# Patient Record
Sex: Female | Born: 1960
Health system: Southern US, Community
[De-identification: ages and names within clinical notes are randomized; demographics above are authoritative.]

## PROBLEM LIST (undated history)

## (undated) DIAGNOSIS — G4730 Sleep apnea, unspecified: Secondary | ICD-10-CM

## (undated) DIAGNOSIS — I509 Heart failure, unspecified: Secondary | ICD-10-CM

## (undated) DIAGNOSIS — J45909 Unspecified asthma, uncomplicated: Secondary | ICD-10-CM

## (undated) DIAGNOSIS — T7840XA Allergy, unspecified, initial encounter: Secondary | ICD-10-CM

## (undated) DIAGNOSIS — G4733 Obstructive sleep apnea (adult) (pediatric): Secondary | ICD-10-CM

## (undated) DIAGNOSIS — G709 Myoneural disorder, unspecified: Secondary | ICD-10-CM

## (undated) DIAGNOSIS — E785 Hyperlipidemia, unspecified: Secondary | ICD-10-CM

## (undated) DIAGNOSIS — E669 Obesity, unspecified: Secondary | ICD-10-CM

## (undated) DIAGNOSIS — M1990 Unspecified osteoarthritis, unspecified site: Secondary | ICD-10-CM

## (undated) DIAGNOSIS — K219 Gastro-esophageal reflux disease without esophagitis: Secondary | ICD-10-CM

## (undated) DIAGNOSIS — R600 Localized edema: Secondary | ICD-10-CM

## (undated) DIAGNOSIS — I10 Essential (primary) hypertension: Secondary | ICD-10-CM

## (undated) HISTORY — DX: Allergy, unspecified, initial encounter: T78.40XA

## (undated) HISTORY — PX: EYE SURGERY: SHX253

## (undated) HISTORY — PX: ABDOMINAL HYSTERECTOMY: SHX81

## (undated) HISTORY — DX: Gastro-esophageal reflux disease without esophagitis: K21.9

## (undated) HISTORY — DX: Hyperlipidemia, unspecified: E78.5

## (undated) HISTORY — DX: Sleep apnea, unspecified: G47.30

## (undated) HISTORY — DX: Localized edema: R60.0

## (undated) HISTORY — DX: Myoneural disorder, unspecified: G70.9

## (undated) HISTORY — DX: Obstructive sleep apnea (adult) (pediatric): G47.33

---

## 1998-12-03 ENCOUNTER — Encounter: Admission: RE | Admit: 1998-12-03 | Discharge: 1998-12-03 | Payer: Self-pay | Admitting: Internal Medicine

## 1998-12-19 ENCOUNTER — Encounter: Admission: RE | Admit: 1998-12-19 | Discharge: 1998-12-19 | Payer: Self-pay | Admitting: Hematology and Oncology

## 1999-01-02 ENCOUNTER — Encounter: Admission: RE | Admit: 1999-01-02 | Discharge: 1999-01-02 | Payer: Self-pay | Admitting: Internal Medicine

## 1999-03-25 ENCOUNTER — Encounter: Admission: RE | Admit: 1999-03-25 | Discharge: 1999-03-25 | Payer: Self-pay | Admitting: Internal Medicine

## 2000-08-07 ENCOUNTER — Encounter: Admission: RE | Admit: 2000-08-07 | Discharge: 2000-08-07 | Payer: Self-pay | Admitting: Internal Medicine

## 2000-11-03 ENCOUNTER — Encounter: Admission: RE | Admit: 2000-11-03 | Discharge: 2000-11-03 | Payer: Self-pay | Admitting: Internal Medicine

## 2000-11-09 ENCOUNTER — Encounter: Admission: RE | Admit: 2000-11-09 | Discharge: 2000-11-09 | Payer: Self-pay

## 2001-04-11 ENCOUNTER — Emergency Department (HOSPITAL_COMMUNITY): Admission: EM | Admit: 2001-04-11 | Discharge: 2001-04-11 | Payer: Self-pay | Admitting: Emergency Medicine

## 2001-04-13 ENCOUNTER — Ambulatory Visit (HOSPITAL_COMMUNITY): Admission: RE | Admit: 2001-04-13 | Discharge: 2001-04-13 | Payer: Self-pay | Admitting: Family Medicine

## 2001-04-13 ENCOUNTER — Encounter: Payer: Self-pay | Admitting: Family Medicine

## 2001-09-01 ENCOUNTER — Encounter: Payer: Self-pay | Admitting: Emergency Medicine

## 2001-09-01 ENCOUNTER — Emergency Department (HOSPITAL_COMMUNITY): Admission: EM | Admit: 2001-09-01 | Discharge: 2001-09-01 | Payer: Self-pay | Admitting: Emergency Medicine

## 2002-04-06 ENCOUNTER — Encounter: Payer: Self-pay | Admitting: Emergency Medicine

## 2002-04-06 ENCOUNTER — Emergency Department (HOSPITAL_COMMUNITY): Admission: EM | Admit: 2002-04-06 | Discharge: 2002-04-06 | Payer: Self-pay | Admitting: Emergency Medicine

## 2002-04-20 ENCOUNTER — Inpatient Hospital Stay (HOSPITAL_COMMUNITY): Admission: EM | Admit: 2002-04-20 | Discharge: 2002-04-22 | Payer: Self-pay | Admitting: Emergency Medicine

## 2002-04-20 ENCOUNTER — Encounter: Payer: Self-pay | Admitting: Internal Medicine

## 2002-04-21 ENCOUNTER — Encounter (INDEPENDENT_AMBULATORY_CARE_PROVIDER_SITE_OTHER): Payer: Self-pay | Admitting: Cardiology

## 2003-03-01 ENCOUNTER — Emergency Department (HOSPITAL_COMMUNITY): Admission: EM | Admit: 2003-03-01 | Discharge: 2003-03-01 | Payer: Self-pay | Admitting: Emergency Medicine

## 2004-02-18 ENCOUNTER — Emergency Department (HOSPITAL_COMMUNITY): Admission: EM | Admit: 2004-02-18 | Discharge: 2004-02-18 | Payer: Self-pay | Admitting: Emergency Medicine

## 2004-03-05 ENCOUNTER — Emergency Department (HOSPITAL_COMMUNITY): Admission: EM | Admit: 2004-03-05 | Discharge: 2004-03-05 | Payer: Self-pay | Admitting: Emergency Medicine

## 2004-05-16 ENCOUNTER — Ambulatory Visit: Payer: Self-pay | Admitting: Family Medicine

## 2004-05-16 ENCOUNTER — Ambulatory Visit: Payer: Self-pay | Admitting: *Deleted

## 2004-05-22 ENCOUNTER — Emergency Department (HOSPITAL_COMMUNITY): Admission: EM | Admit: 2004-05-22 | Discharge: 2004-05-22 | Payer: Self-pay | Admitting: Emergency Medicine

## 2004-09-12 ENCOUNTER — Ambulatory Visit: Payer: Self-pay | Admitting: Family Medicine

## 2004-09-17 ENCOUNTER — Ambulatory Visit: Payer: Self-pay | Admitting: Family Medicine

## 2004-12-13 ENCOUNTER — Ambulatory Visit: Payer: Self-pay | Admitting: Family Medicine

## 2004-12-17 ENCOUNTER — Ambulatory Visit (HOSPITAL_COMMUNITY): Admission: RE | Admit: 2004-12-17 | Discharge: 2004-12-17 | Payer: Self-pay | Admitting: Family Medicine

## 2005-02-13 ENCOUNTER — Ambulatory Visit: Payer: Self-pay | Admitting: Family Medicine

## 2005-06-04 ENCOUNTER — Ambulatory Visit: Payer: Self-pay | Admitting: Family Medicine

## 2005-06-13 ENCOUNTER — Ambulatory Visit (HOSPITAL_COMMUNITY): Admission: RE | Admit: 2005-06-13 | Discharge: 2005-06-13 | Payer: Self-pay | Admitting: Family Medicine

## 2005-07-16 ENCOUNTER — Ambulatory Visit: Payer: Self-pay | Admitting: Family Medicine

## 2005-09-14 ENCOUNTER — Emergency Department (HOSPITAL_COMMUNITY): Admission: EM | Admit: 2005-09-14 | Discharge: 2005-09-14 | Payer: Self-pay | Admitting: Emergency Medicine

## 2005-09-15 ENCOUNTER — Ambulatory Visit: Payer: Self-pay | Admitting: Family Medicine

## 2006-01-05 ENCOUNTER — Ambulatory Visit: Payer: Self-pay | Admitting: Family Medicine

## 2006-01-21 ENCOUNTER — Ambulatory Visit: Payer: Self-pay | Admitting: Family Medicine

## 2006-03-17 ENCOUNTER — Ambulatory Visit: Payer: Self-pay | Admitting: Family Medicine

## 2006-07-23 ENCOUNTER — Emergency Department (HOSPITAL_COMMUNITY): Admission: EM | Admit: 2006-07-23 | Discharge: 2006-07-24 | Payer: Self-pay | Admitting: Emergency Medicine

## 2006-07-24 ENCOUNTER — Encounter: Payer: Self-pay | Admitting: Vascular Surgery

## 2006-07-24 ENCOUNTER — Ambulatory Visit (HOSPITAL_COMMUNITY): Admission: RE | Admit: 2006-07-24 | Discharge: 2006-07-24 | Payer: Self-pay

## 2006-07-28 ENCOUNTER — Ambulatory Visit: Payer: Self-pay | Admitting: Family Medicine

## 2006-07-29 ENCOUNTER — Ambulatory Visit (HOSPITAL_COMMUNITY): Admission: RE | Admit: 2006-07-29 | Discharge: 2006-07-29 | Payer: Self-pay | Admitting: Family Medicine

## 2006-08-04 ENCOUNTER — Ambulatory Visit: Payer: Self-pay | Admitting: Family Medicine

## 2006-08-18 DIAGNOSIS — I509 Heart failure, unspecified: Secondary | ICD-10-CM

## 2006-08-18 HISTORY — DX: Heart failure, unspecified: I50.9

## 2006-11-02 ENCOUNTER — Ambulatory Visit: Payer: Self-pay | Admitting: Family Medicine

## 2006-11-30 ENCOUNTER — Emergency Department (HOSPITAL_COMMUNITY): Admission: EM | Admit: 2006-11-30 | Discharge: 2006-11-30 | Payer: Self-pay | Admitting: Emergency Medicine

## 2006-12-08 ENCOUNTER — Emergency Department (HOSPITAL_COMMUNITY): Admission: EM | Admit: 2006-12-08 | Discharge: 2006-12-08 | Payer: Self-pay | Admitting: Emergency Medicine

## 2007-01-21 ENCOUNTER — Emergency Department (HOSPITAL_COMMUNITY): Admission: EM | Admit: 2007-01-21 | Discharge: 2007-01-21 | Payer: Self-pay | Admitting: Emergency Medicine

## 2007-02-04 ENCOUNTER — Emergency Department (HOSPITAL_COMMUNITY): Admission: EM | Admit: 2007-02-04 | Discharge: 2007-02-04 | Payer: Self-pay | Admitting: Family Medicine

## 2007-02-05 ENCOUNTER — Emergency Department (HOSPITAL_COMMUNITY): Admission: EM | Admit: 2007-02-05 | Discharge: 2007-02-05 | Payer: Self-pay | Admitting: Emergency Medicine

## 2007-02-06 ENCOUNTER — Emergency Department (HOSPITAL_COMMUNITY): Admission: EM | Admit: 2007-02-06 | Discharge: 2007-02-06 | Payer: Self-pay | Admitting: Emergency Medicine

## 2007-02-10 ENCOUNTER — Ambulatory Visit: Payer: Self-pay | Admitting: Family Medicine

## 2007-02-12 DIAGNOSIS — E7800 Pure hypercholesterolemia, unspecified: Secondary | ICD-10-CM | POA: Insufficient documentation

## 2007-02-12 DIAGNOSIS — J45909 Unspecified asthma, uncomplicated: Secondary | ICD-10-CM | POA: Insufficient documentation

## 2007-02-12 DIAGNOSIS — E1165 Type 2 diabetes mellitus with hyperglycemia: Secondary | ICD-10-CM

## 2007-02-12 DIAGNOSIS — J309 Allergic rhinitis, unspecified: Secondary | ICD-10-CM | POA: Insufficient documentation

## 2007-02-12 DIAGNOSIS — M1990 Unspecified osteoarthritis, unspecified site: Secondary | ICD-10-CM | POA: Insufficient documentation

## 2007-02-12 DIAGNOSIS — I10 Essential (primary) hypertension: Secondary | ICD-10-CM | POA: Insufficient documentation

## 2007-02-12 DIAGNOSIS — E669 Obesity, unspecified: Secondary | ICD-10-CM

## 2007-02-12 DIAGNOSIS — E1169 Type 2 diabetes mellitus with other specified complication: Secondary | ICD-10-CM | POA: Insufficient documentation

## 2007-02-12 DIAGNOSIS — M069 Rheumatoid arthritis, unspecified: Secondary | ICD-10-CM | POA: Insufficient documentation

## 2007-02-15 ENCOUNTER — Ambulatory Visit: Payer: Self-pay | Admitting: Family Medicine

## 2007-05-05 ENCOUNTER — Encounter (INDEPENDENT_AMBULATORY_CARE_PROVIDER_SITE_OTHER): Payer: Self-pay | Admitting: *Deleted

## 2007-08-17 ENCOUNTER — Ambulatory Visit: Payer: Self-pay | Admitting: Family Medicine

## 2007-08-18 ENCOUNTER — Emergency Department (HOSPITAL_COMMUNITY): Admission: EM | Admit: 2007-08-18 | Discharge: 2007-08-18 | Payer: Self-pay | Admitting: Emergency Medicine

## 2007-09-03 ENCOUNTER — Emergency Department (HOSPITAL_COMMUNITY): Admission: EM | Admit: 2007-09-03 | Discharge: 2007-09-04 | Payer: Self-pay | Admitting: Emergency Medicine

## 2007-10-14 ENCOUNTER — Ambulatory Visit: Payer: Self-pay | Admitting: Internal Medicine

## 2007-10-15 ENCOUNTER — Encounter (INDEPENDENT_AMBULATORY_CARE_PROVIDER_SITE_OTHER): Payer: Self-pay | Admitting: Family Medicine

## 2007-10-15 LAB — CONVERTED CEMR LAB: Microalb, Ur: 0.37 mg/dL (ref 0.00–1.89)

## 2008-01-12 ENCOUNTER — Ambulatory Visit: Payer: Self-pay | Admitting: Family Medicine

## 2008-01-14 ENCOUNTER — Ambulatory Visit: Payer: Self-pay | Admitting: Internal Medicine

## 2008-01-28 ENCOUNTER — Ambulatory Visit: Payer: Self-pay | Admitting: Family Medicine

## 2008-04-06 ENCOUNTER — Emergency Department (HOSPITAL_COMMUNITY): Admission: EM | Admit: 2008-04-06 | Discharge: 2008-04-06 | Payer: Self-pay | Admitting: Emergency Medicine

## 2008-04-26 ENCOUNTER — Ambulatory Visit: Payer: Self-pay | Admitting: Family Medicine

## 2008-09-05 ENCOUNTER — Ambulatory Visit: Payer: Self-pay | Admitting: Family Medicine

## 2008-09-06 ENCOUNTER — Ambulatory Visit: Payer: Self-pay | Admitting: Family Medicine

## 2008-09-28 ENCOUNTER — Ambulatory Visit: Payer: Self-pay | Admitting: Family Medicine

## 2008-10-04 ENCOUNTER — Ambulatory Visit: Payer: Self-pay | Admitting: Family Medicine

## 2008-12-07 ENCOUNTER — Ambulatory Visit: Payer: Self-pay | Admitting: Family Medicine

## 2009-02-27 ENCOUNTER — Ambulatory Visit: Payer: Self-pay | Admitting: Family Medicine

## 2009-05-24 ENCOUNTER — Ambulatory Visit: Payer: Self-pay | Admitting: Family Medicine

## 2009-05-24 ENCOUNTER — Inpatient Hospital Stay (HOSPITAL_COMMUNITY): Admission: EM | Admit: 2009-05-24 | Discharge: 2009-05-25 | Payer: Self-pay | Admitting: Emergency Medicine

## 2009-05-31 ENCOUNTER — Ambulatory Visit: Payer: Self-pay | Admitting: Family Medicine

## 2009-07-25 ENCOUNTER — Emergency Department (HOSPITAL_COMMUNITY): Admission: EM | Admit: 2009-07-25 | Discharge: 2009-07-25 | Payer: Self-pay | Admitting: Emergency Medicine

## 2009-10-20 ENCOUNTER — Emergency Department (HOSPITAL_COMMUNITY): Admission: EM | Admit: 2009-10-20 | Discharge: 2009-10-20 | Payer: Self-pay | Admitting: Emergency Medicine

## 2009-10-23 ENCOUNTER — Ambulatory Visit: Payer: Self-pay | Admitting: Family Medicine

## 2009-11-29 ENCOUNTER — Ambulatory Visit: Payer: Self-pay | Admitting: Internal Medicine

## 2009-11-29 ENCOUNTER — Encounter (INDEPENDENT_AMBULATORY_CARE_PROVIDER_SITE_OTHER): Payer: Self-pay | Admitting: Adult Health

## 2009-11-29 LAB — CONVERTED CEMR LAB
BUN: 15 mg/dL (ref 6–23)
CO2: 28 meq/L (ref 19–32)
Chloride: 100 meq/L (ref 96–112)
Glucose, Bld: 246 mg/dL — ABNORMAL HIGH (ref 70–99)
Potassium: 4.2 meq/L (ref 3.5–5.3)
Vit D, 25-Hydroxy: 21 ng/mL — ABNORMAL LOW (ref 30–89)

## 2009-12-27 ENCOUNTER — Ambulatory Visit: Payer: Self-pay | Admitting: Internal Medicine

## 2009-12-27 ENCOUNTER — Encounter (INDEPENDENT_AMBULATORY_CARE_PROVIDER_SITE_OTHER): Payer: Self-pay | Admitting: Family Medicine

## 2009-12-27 LAB — CONVERTED CEMR LAB: Microalb, Ur: 0.5 mg/dL (ref 0.00–1.89)

## 2010-01-04 ENCOUNTER — Emergency Department (HOSPITAL_COMMUNITY): Admission: EM | Admit: 2010-01-04 | Discharge: 2010-01-04 | Payer: Self-pay | Admitting: Emergency Medicine

## 2010-01-17 ENCOUNTER — Emergency Department (HOSPITAL_COMMUNITY): Admission: EM | Admit: 2010-01-17 | Discharge: 2010-01-17 | Payer: Self-pay | Admitting: Emergency Medicine

## 2010-03-06 ENCOUNTER — Ambulatory Visit: Payer: Self-pay | Admitting: Internal Medicine

## 2010-04-02 ENCOUNTER — Ambulatory Visit: Payer: Self-pay | Admitting: Family Medicine

## 2010-07-03 ENCOUNTER — Ambulatory Visit: Payer: Self-pay | Admitting: Internal Medicine

## 2010-08-01 ENCOUNTER — Ambulatory Visit: Payer: Self-pay | Admitting: Family Medicine

## 2010-09-07 ENCOUNTER — Encounter: Payer: Self-pay | Admitting: Family Medicine

## 2010-11-04 LAB — DIFFERENTIAL
Lymphocytes Relative: 33 % (ref 12–46)
Lymphs Abs: 1.8 10*3/uL (ref 0.7–4.0)
Monocytes Relative: 5 % (ref 3–12)
Neutrophils Relative %: 59 % (ref 43–77)

## 2010-11-04 LAB — CBC
HCT: 34.4 % — ABNORMAL LOW (ref 36.0–46.0)
MCHC: 33.7 g/dL (ref 30.0–36.0)
Platelets: 287 10*3/uL (ref 150–400)
RBC: 4.22 MIL/uL (ref 3.87–5.11)
RDW: 14.7 % (ref 11.5–15.5)
WBC: 5.7 10*3/uL (ref 4.0–10.5)

## 2010-11-04 LAB — URINALYSIS, ROUTINE W REFLEX MICROSCOPIC
Ketones, ur: NEGATIVE mg/dL
Leukocytes, UA: NEGATIVE
Nitrite: NEGATIVE
pH: 5.5 (ref 5.0–8.0)

## 2010-11-04 LAB — BASIC METABOLIC PANEL
BUN: 10 mg/dL (ref 6–23)
Creatinine, Ser: 0.67 mg/dL (ref 0.4–1.2)
GFR calc non Af Amer: >60 mL/min (ref >60)

## 2010-11-04 LAB — PREGNANCY, URINE: Preg Test, Ur: NEGATIVE

## 2010-11-04 LAB — URINE MICROSCOPIC-ADD ON

## 2010-11-04 LAB — POCT CARDIAC MARKERS
CKMB, poc: 3.4 ng/mL (ref 1.0–8.0)
Troponin i, poc: <0.05 ng/mL (ref 0.00–0.09)

## 2010-11-04 LAB — URINE CULTURE

## 2010-11-10 LAB — URINALYSIS, ROUTINE W REFLEX MICROSCOPIC
Ketones, ur: NEGATIVE mg/dL
Nitrite: NEGATIVE
Specific Gravity, Urine: 1.026 (ref 1.005–1.030)
pH: 5.5 (ref 5.0–8.0)

## 2010-11-10 LAB — POCT I-STAT, CHEM 8
Calcium, Ion: 0.97 mmol/L — ABNORMAL LOW (ref 1.12–1.32)
Chloride: 105 mEq/L (ref 96–112)
HCT: 37 % (ref 36.0–46.0)
Potassium: 4.1 mEq/L (ref 3.5–5.1)

## 2010-11-10 LAB — POCT CARDIAC MARKERS: Troponin i, poc: <0.05 ng/mL (ref 0.00–0.09)

## 2010-11-10 LAB — DIFFERENTIAL
Basophils Absolute: 0.1 10*3/uL (ref 0.0–0.1)
Lymphocytes Relative: 33 % (ref 12–46)
Neutro Abs: 3.8 10*3/uL (ref 1.7–7.7)
Neutrophils Relative %: 58 % (ref 43–77)

## 2010-11-10 LAB — CBC
Platelets: 191 10*3/uL (ref 150–400)
RDW: 15.1 % (ref 11.5–15.5)

## 2010-11-10 LAB — BRAIN NATRIURETIC PEPTIDE: Pro B Natriuretic peptide (BNP): 62 pg/mL (ref 0.0–100.0)

## 2010-11-19 LAB — BASIC METABOLIC PANEL
BUN: 8 mg/dL (ref 6–23)
GFR calc non Af Amer: >60 mL/min (ref >60)
Glucose, Bld: 251 mg/dL — ABNORMAL HIGH (ref 70–99)
Potassium: 3.6 mEq/L (ref 3.5–5.1)

## 2010-11-19 LAB — DIFFERENTIAL
Basophils Absolute: 0 10*3/uL (ref 0.0–0.1)
Basophils Relative: 0 % (ref 0–1)
Eosinophils Absolute: 0.1 10*3/uL (ref 0.0–0.7)
Eosinophils Relative: 2 % (ref 0–5)
Lymphocytes Relative: 28 % (ref 12–46)

## 2010-11-19 LAB — CBC
HCT: 36.2 % (ref 36.0–46.0)
MCHC: 31.8 g/dL (ref 30.0–36.0)
MCV: 81.8 fL (ref 78.0–100.0)
Platelets: 252 10*3/uL (ref 150–400)
RDW: 14.4 % (ref 11.5–15.5)

## 2010-11-21 LAB — CBC
HCT: 32.9 % — ABNORMAL LOW (ref 36.0–46.0)
Hemoglobin: 10.8 g/dL — ABNORMAL LOW (ref 12.0–15.0)
MCHC: 32.7 g/dL (ref 30.0–36.0)
Platelets: 175 10*3/uL (ref 150–400)
Platelets: 203 10*3/uL (ref 150–400)
RDW: 14.2 % (ref 11.5–15.5)
RDW: 14.4 % (ref 11.5–15.5)
WBC: 6.9 10*3/uL (ref 4.0–10.5)

## 2010-11-21 LAB — CK TOTAL AND CKMB (NOT AT ARMC)
Relative Index: 0.8 (ref 0.0–2.5)
Total CK: 342 U/L — ABNORMAL HIGH (ref 7–177)

## 2010-11-21 LAB — BASIC METABOLIC PANEL
BUN: 23 mg/dL (ref 6–23)
BUN: 31 mg/dL — ABNORMAL HIGH (ref 6–23)
Chloride: 99 mEq/L (ref 96–112)
Creatinine, Ser: 0.73 mg/dL (ref 0.4–1.2)
Creatinine, Ser: 0.87 mg/dL (ref 0.4–1.2)
GFR calc Af Amer: >60 mL/min (ref >60)
GFR calc non Af Amer: >60 mL/min (ref >60)
GFR calc non Af Amer: >60 mL/min (ref >60)

## 2010-11-21 LAB — HEMOGLOBIN A1C: Mean Plasma Glucose: 235 mg/dL

## 2010-11-21 LAB — TROPONIN I: Troponin I: <0.01 ng/mL (ref 0.00–0.06)

## 2010-11-21 LAB — LIPID PANEL
LDL Cholesterol: 105 mg/dL — ABNORMAL HIGH (ref 0–99)
Total CHOL/HDL Ratio: 3.8 RATIO
Triglycerides: 101 mg/dL (ref <150)
VLDL: 20 mg/dL (ref 0–40)

## 2010-11-21 LAB — DIFFERENTIAL
Basophils Relative: 1 % (ref 0–1)
Eosinophils Absolute: 0.2 10*3/uL (ref 0.0–0.7)
Eosinophils Relative: 3 % (ref 0–5)
Monocytes Relative: 6 % (ref 3–12)
Neutrophils Relative %: 57 % (ref 43–77)

## 2010-11-21 LAB — GLUCOSE, CAPILLARY
Glucose-Capillary: 190 mg/dL — ABNORMAL HIGH (ref 70–99)
Glucose-Capillary: 239 mg/dL — ABNORMAL HIGH (ref 70–99)

## 2010-11-21 LAB — CARDIAC PANEL(CRET KIN+CKTOT+MB+TROPI)
Relative Index: 0.8 (ref 0.0–2.5)
Total CK: 263 U/L — ABNORMAL HIGH (ref 7–177)

## 2010-11-21 LAB — BRAIN NATRIURETIC PEPTIDE: Pro B Natriuretic peptide (BNP): <30 pg/mL (ref 0.0–100.0)

## 2011-01-03 NOTE — Discharge Summary (Signed)
NAME:  Katelyn Howard, Katelyn Howard                     ACCOUNT NO.:  [id number]   MEDICAL RECORD NO.:  [id number]                   PATIENT TYPE:  INP   LOCATION:  4707                                 FACILITY:  [facility]   PHYSICIAN:  Talmage Coin, M.D.                  DATE OF BIRTH:  Aug 23, 1960   DATE OF ADMISSION:  04/20/2002  DATE OF DISCHARGE:  04/22/2002                                 DISCHARGE SUMMARY   DISCHARGE DIAGNOSES:  1. Chest pain, noncardiac by exercise Cardiolite showing no ischemia.  2. Palpitations, no cause found.  3. Tobacco abuse.  4. Hypotension.  5. Normocytic anemia.  6. Diabetes mellitus, uncontrolled, hemoglobin A1c 11.5.  7. Obesity.  8. Status post hysterectomy, secondary to bleeding.   DISCHARGE MEDICATIONS:  1. Glipizide 10 mg q.d.  2. Zocor 20 mg q.d.  3. Mavik 1 mg q.d.  4. Aspirin 325 mg q.d.  5. Metformin 500 mg q.d. with meals.   DISPOSITION:  To home.   FOLLOWUP:  The patient will follow up at [facility]; she will arrange for  an appointment within the next week.  At that time, her hemoglobin should be  checked; it was 11.6 in the hospital.   CONSULTANTS:  Dr. Madaline Savage in cardiology.   HISTORY OF PRESENT ILLNESS:  The patient is a 50 year old African American  with diabetes who presents to the ER for a three-day history of chest pain,  palpitations and near-syncope that started Monday morning when she felt  numbness in her left fourth and fifth mornings, when at night, started  having a feeling of chest pressure.  This was accompanied by burping so she  took multiple Tums, thinking it was gas.  It eventually went away.  Yesterday, while walking around, she felt her heart beating rapidly.  She  had chest pressure and almost passed out.  She sat down, became diaphoretic  with shortness of breath and nauseated; this went on until about 2 a.m.  The  morning of admission, she went to work and the same thing happened, the same  chest  pain.  It went away after 20 minutes, then happened again and now she  is here with 7/10 chest pain and has orthopnea and PND.  Denies fever,  diarrhea, melena or hematuria.   PHYSICAL EXAMINATION:  VITAL SIGNS:  Blood pressure 162/81, temperature  98.0, pulse 74, respirations 22, saturation 99% on room air air.  GENERAL:  She is an obese female in no acute distress.  HEENT:  Normocephalic,  atraumatic.  Pupils equal, round and reactive to light.  Extraocular  movements are intact.  Oropharynx and nasopharynx moist.  No lesions.  NECK:  Supple.  No bruits.  LUNGS:  Clear to auscultation bilaterally.  No wheezes.  CARDIOVASCULAR:  Regular rate and rhythm.  No murmurs, rubs, or gallops.  ABDOMEN:  Soft, obese and nontender to palpation.  Degreased bowel sounds.  EXTREMITIES:  Edema 1+, nonpitting.  NEUROLOGIC:  No focal abnormalities.   LABORATORY DATA:  Sodium 134, potassium 3.7, chloride 101, CO2 22, BUN 11,  creatinine 0.6, glucose 296.  WBC 7.3, hemoglobin 11.6, platelets of  225,000.   HOSPITAL COURSE:  1. CHEST PAIN:  The patient had chest pain we thought which would not be     cardiac, based on her negative enzymes, even after 24 hours of pain.  She     did rule out by enzymes, with CKs running from 285 to 205, but MB     fractions being quite low and troponins being normal.  She had an     exercise Cardiolite which showed no ischemia.  Her echocardiogram showed     an EF of 50-55% with no focal abnormalities.  Her chest pain did not     recur during her hospitalization and she will be sent home with     instructions to take an H2 blocker.  She can be started on a PPI, if     these pains continue, although they do not sound like they are cardiac or     GI in origin, but more like a panic attack or musculoskeletal.   1. PALPITATIONS:  The patient was kept on a telemetry bed during her entire     hospitalization.  She continued to complain of these palpitation feelings     but  there were no changes on her telemetry.  It is felt that these     palpitations are due to musculoskeletal or panic attack origin.   1. DIABETES MELLITUS, TYPE 2:  The patient will continue her Glipizide 10 mg     q.d.  She was also started on Metformin 500 mg q.d. as she is obese and     would benefit from Metformin as a diabetic medication.  This should be     titrated upwards as tolerated.   1. HYPERLIPIDEMIA:  The patient was found to have an HDL of 56 and an LDL of     172 with normal triglycerides.  She was started on Zocor 20 mg q.d.  She     was to continue this medication in the future and have her cholesterol     rechecked in six months.   1. HYPERTENSION:  The patient had mild hypertension but being a diabetic,     she was started on an ACE inhibitor; she will take Mavik 1 mg q.d., to be     titrated as needed.   1. NORMOCYTIC ANEMIA:  The patient had a hemoglobin of 11.6, which is very     mild, but this should be rechecked after her discharge and followed up     with stool cards.  She reports having a hysterectomy in the past, so this     would not be a source of blood loss.     Thayer Headings, M.D.                     Talmage Coin, M.D.    BM/MEDQ  D:  04/22/2002  T:  04/25/2002  Job:  [id number]   cc:   Madaline Savage, M.D.  [address]  [place]  [place] [address]  Fax: [phone]   [facility]

## 2011-05-08 LAB — URINE MICROSCOPIC-ADD ON

## 2011-05-08 LAB — CBC
Hemoglobin: 12
RBC: 4.57
RDW: 14.1

## 2011-05-08 LAB — DIFFERENTIAL
Basophils Absolute: 0
Lymphocytes Relative: 33
Monocytes Absolute: 0.5
Neutro Abs: 4.5

## 2011-05-08 LAB — POCT CARDIAC MARKERS
CKMB, poc: 4.6
Myoglobin, poc: 113

## 2011-05-08 LAB — BASIC METABOLIC PANEL
Calcium: 9.4
GFR calc Af Amer: >60
GFR calc non Af Amer: >60
Glucose, Bld: 333 — ABNORMAL HIGH
Sodium: 130 — ABNORMAL LOW

## 2011-05-08 LAB — URINALYSIS, ROUTINE W REFLEX MICROSCOPIC
Bilirubin Urine: NEGATIVE
Glucose, UA: >1000 — AB
Ketones, ur: NEGATIVE
pH: 5.5

## 2011-05-08 LAB — B-NATRIURETIC PEPTIDE (CONVERTED LAB): Pro B Natriuretic peptide (BNP): <30

## 2011-05-23 LAB — COMPREHENSIVE METABOLIC PANEL
ALT: 26
AST: 22
Albumin: 3.5
CO2: 26
Calcium: 9.1
GFR calc Af Amer: >60
Sodium: 136
Total Protein: 7.9

## 2011-05-23 LAB — CBC
MCHC: 33.6
Platelets: 234
RBC: 4.67
RDW: 14.4

## 2011-05-23 LAB — POCT CARDIAC MARKERS
Myoglobin, poc: 78
Operator id: 4708

## 2011-05-23 LAB — URINE MICROSCOPIC-ADD ON

## 2011-05-23 LAB — URINALYSIS, ROUTINE W REFLEX MICROSCOPIC
Glucose, UA: >1000 — AB
Hgb urine dipstick: NEGATIVE
Ketones, ur: NEGATIVE
Protein, ur: NEGATIVE
pH: 5.5

## 2011-05-23 LAB — DIFFERENTIAL
Eosinophils Absolute: 0.1
Eosinophils Relative: 2
Lymphs Abs: 1.9
Monocytes Absolute: 0.5
Monocytes Relative: 6

## 2011-05-23 LAB — POCT PREGNANCY, URINE: Preg Test, Ur: NEGATIVE

## 2011-06-03 ENCOUNTER — Emergency Department (HOSPITAL_COMMUNITY)
Admission: EM | Admit: 2011-06-03 | Discharge: 2011-06-03 | Disposition: A | Discharge status: Home / Self Care | Payer: Medicare Other | Attending: Emergency Medicine | Admitting: Emergency Medicine

## 2011-06-03 DIAGNOSIS — I509 Heart failure, unspecified: Secondary | ICD-10-CM | POA: Insufficient documentation

## 2011-06-03 DIAGNOSIS — I10 Essential (primary) hypertension: Secondary | ICD-10-CM | POA: Insufficient documentation

## 2011-06-03 DIAGNOSIS — E119 Type 2 diabetes mellitus without complications: Secondary | ICD-10-CM | POA: Insufficient documentation

## 2011-06-03 DIAGNOSIS — L03019 Cellulitis of unspecified finger: Secondary | ICD-10-CM | POA: Insufficient documentation

## 2011-06-03 DIAGNOSIS — K219 Gastro-esophageal reflux disease without esophagitis: Secondary | ICD-10-CM | POA: Insufficient documentation

## 2011-06-03 DIAGNOSIS — IMO0002 Reserved for concepts with insufficient information to code with codable children: Secondary | ICD-10-CM | POA: Insufficient documentation

## 2011-06-03 DIAGNOSIS — W278XXA Contact with other nonpowered hand tool, initial encounter: Secondary | ICD-10-CM | POA: Insufficient documentation

## 2011-06-03 DIAGNOSIS — E785 Hyperlipidemia, unspecified: Secondary | ICD-10-CM | POA: Insufficient documentation

## 2011-06-03 DIAGNOSIS — M79609 Pain in unspecified limb: Secondary | ICD-10-CM | POA: Insufficient documentation

## 2011-06-03 DIAGNOSIS — L02519 Cutaneous abscess of unspecified hand: Secondary | ICD-10-CM | POA: Insufficient documentation

## 2011-06-03 DIAGNOSIS — Z79899 Other long term (current) drug therapy: Secondary | ICD-10-CM | POA: Insufficient documentation

## 2011-06-04 LAB — I-STAT 8, (EC8 V) (CONVERTED LAB)
Acid-Base Excess: 5 — ABNORMAL HIGH
BUN: 27 — ABNORMAL HIGH
Chloride: 101
HCT: 38
Hemoglobin: 12.9
Operator id: 270651
Potassium: 3.4 — ABNORMAL LOW
pCO2, Ven: 51.8 — ABNORMAL HIGH

## 2011-06-04 LAB — BASIC METABOLIC PANEL
BUN: 23
Chloride: 98
GFR calc non Af Amer: >60
Glucose, Bld: 322 — ABNORMAL HIGH
Potassium: 3.5
Sodium: 136

## 2011-06-04 LAB — DIFFERENTIAL
Basophils Relative: 0
Lymphocytes Relative: 37
Monocytes Absolute: 0.3
Monocytes Relative: 5
Neutro Abs: 3.6
Neutrophils Relative %: 56

## 2011-06-04 LAB — CBC
Hemoglobin: 11.1 — ABNORMAL LOW
RBC: 4.33
WBC: 6.5

## 2011-06-04 LAB — POCT CARDIAC MARKERS: CKMB, poc: 5.3

## 2011-06-05 LAB — DIFFERENTIAL
Basophils Absolute: 0.1
Basophils Relative: 1
Lymphocytes Relative: 31
Neutro Abs: 3.9
Neutrophils Relative %: 60

## 2011-06-05 LAB — COMPREHENSIVE METABOLIC PANEL
Albumin: 3.7
Alkaline Phosphatase: 83
BUN: 24 — ABNORMAL HIGH
Chloride: 100
Creatinine, Ser: 0.69
Glucose, Bld: 213 — ABNORMAL HIGH
Potassium: 3.9
Total Bilirubin: 0.6

## 2011-06-05 LAB — TYPE AND SCREEN
ABO/RH(D): O POS
Antibody Screen: NEGATIVE

## 2011-06-05 LAB — CBC
HCT: 35 — ABNORMAL LOW
Hemoglobin: 11.6 — ABNORMAL LOW
MCV: 77.9 — ABNORMAL LOW
Platelets: 238
RDW: 15.2 — ABNORMAL HIGH
WBC: 6.4

## 2011-06-05 LAB — ABO/RH: ABO/RH(D): O POS

## 2011-06-09 LAB — WOUND CULTURE

## 2011-10-24 ENCOUNTER — Encounter (HOSPITAL_COMMUNITY): Payer: Self-pay | Admitting: *Deleted

## 2011-10-24 ENCOUNTER — Emergency Department (HOSPITAL_COMMUNITY): Payer: Medicare Other

## 2011-10-24 ENCOUNTER — Emergency Department (HOSPITAL_COMMUNITY)
Admission: EM | Admit: 2011-10-24 | Discharge: 2011-10-24 | Disposition: A | Discharge status: Home / Self Care | Payer: Medicare Other | Attending: Emergency Medicine | Admitting: Emergency Medicine

## 2011-10-24 DIAGNOSIS — R0989 Other specified symptoms and signs involving the circulatory and respiratory systems: Secondary | ICD-10-CM | POA: Insufficient documentation

## 2011-10-24 DIAGNOSIS — I2510 Atherosclerotic heart disease of native coronary artery without angina pectoris: Secondary | ICD-10-CM | POA: Insufficient documentation

## 2011-10-24 DIAGNOSIS — I10 Essential (primary) hypertension: Secondary | ICD-10-CM | POA: Insufficient documentation

## 2011-10-24 DIAGNOSIS — R0602 Shortness of breath: Secondary | ICD-10-CM | POA: Insufficient documentation

## 2011-10-24 DIAGNOSIS — R062 Wheezing: Secondary | ICD-10-CM | POA: Insufficient documentation

## 2011-10-24 DIAGNOSIS — R6883 Chills (without fever): Secondary | ICD-10-CM | POA: Insufficient documentation

## 2011-10-24 DIAGNOSIS — E119 Type 2 diabetes mellitus without complications: Secondary | ICD-10-CM | POA: Insufficient documentation

## 2011-10-24 DIAGNOSIS — J3489 Other specified disorders of nose and nasal sinuses: Secondary | ICD-10-CM | POA: Insufficient documentation

## 2011-10-24 DIAGNOSIS — R059 Cough, unspecified: Secondary | ICD-10-CM | POA: Insufficient documentation

## 2011-10-24 DIAGNOSIS — J069 Acute upper respiratory infection, unspecified: Secondary | ICD-10-CM

## 2011-10-24 DIAGNOSIS — Z79899 Other long term (current) drug therapy: Secondary | ICD-10-CM | POA: Insufficient documentation

## 2011-10-24 DIAGNOSIS — Z794 Long term (current) use of insulin: Secondary | ICD-10-CM | POA: Insufficient documentation

## 2011-10-24 DIAGNOSIS — R05 Cough: Secondary | ICD-10-CM | POA: Insufficient documentation

## 2011-10-24 HISTORY — DX: Essential (primary) hypertension: I10

## 2011-10-24 LAB — URINALYSIS, ROUTINE W REFLEX MICROSCOPIC
Bilirubin Urine: NEGATIVE
Hgb urine dipstick: NEGATIVE
Ketones, ur: NEGATIVE mg/dL
Nitrite: NEGATIVE
Protein, ur: NEGATIVE mg/dL
Urobilinogen, UA: 1 mg/dL (ref 0.0–1.0)

## 2011-10-24 LAB — URINE MICROSCOPIC-ADD ON

## 2011-10-24 MED ORDER — AZITHROMYCIN 250 MG PO TABS
500.0000 mg | ORAL_TABLET | Freq: Once | ORAL | Status: AC
  Administered 2011-10-24: 500 mg via ORAL
  Filled 2011-10-24: qty 2

## 2011-10-24 MED ORDER — IPRATROPIUM BROMIDE 0.02 % IN SOLN
0.5000 mg | Freq: Once | RESPIRATORY_TRACT | Status: AC
  Administered 2011-10-24: 0.5 mg via RESPIRATORY_TRACT
  Filled 2011-10-24: qty 2.5

## 2011-10-24 MED ORDER — ALBUTEROL SULFATE (5 MG/ML) 0.5% IN NEBU
5.0000 mg | INHALATION_SOLUTION | Freq: Once | RESPIRATORY_TRACT | Status: AC
  Administered 2011-10-24: 5 mg via RESPIRATORY_TRACT
  Filled 2011-10-24: qty 1

## 2011-10-24 MED ORDER — SODIUM CHLORIDE 0.9 % IV BOLUS (SEPSIS)
1000.0000 mL | Freq: Once | INTRAVENOUS | Status: AC
  Administered 2011-10-24: 1000 mL via INTRAVENOUS

## 2011-10-24 MED ORDER — ALBUTEROL SULFATE (5 MG/ML) 0.5% IN NEBU
INHALATION_SOLUTION | RESPIRATORY_TRACT | Status: AC
  Filled 2011-10-24: qty 2

## 2011-10-24 MED ORDER — GUAIFENESIN-CODEINE 100-10 MG/5ML PO SYRP: 5.0000 mL | ORAL_SOLUTION | Freq: Three times a day (TID) | ORAL | Status: AC | PRN

## 2011-10-24 MED ORDER — ALBUTEROL (5 MG/ML) CONTINUOUS INHALATION SOLN
10.0000 mg/h | INHALATION_SOLUTION | Freq: Once | RESPIRATORY_TRACT | Status: AC
  Administered 2011-10-24: 10 mg/h via RESPIRATORY_TRACT

## 2011-10-24 MED ORDER — AZITHROMYCIN 250 MG PO TABS: 250.0000 mg | ORAL_TABLET | Freq: Every day | ORAL | Status: AC

## 2011-10-24 NOTE — ED Provider Notes (Signed)
History     CSN: [id number]  Arrival date & time 10/24/11  1559   First MD Initiated Contact with Patient 10/24/11 1658      Chief Complaint  Patient presents with  . Chills    (Consider location/radiation/quality/duration/timing/severity/associated sxs/prior treatment) HPI  Pt presents to the ED with symptoms of coughing, nasal congestion, asthma. She has a PMH positive for diabetes and informs me that she has not checked her sugars this week as she has been sick "and laying in the bed". The patient has an albuterol inhaler at home which she has been using but informs me that it has not been helping at all. She has not gotten sleep these past two days because her coughing is so bad. She denies sore throat, ear pain, abdominal pain,   Past Medical History  Diagnosis Date  . Hypertension   . Diabetes mellitus   . Coronary artery disease     History reviewed. No pertinent past surgical history.  History reviewed. No pertinent family history.  History  Substance Use Topics  . Smoking status: Never Smoker   . Smokeless tobacco: Not on file  . Alcohol Use: No    OB History    Grav Para Term Preterm Abortions TAB SAB Ect Mult Living                  Review of Systems  All other systems reviewed and are negative.    Allergies  Penicillins and Tetracycline  Home Medications   Current Outpatient Rx  Name Route Sig Dispense Refill  . ALBUTEROL SULFATE HFA 108 (90 BASE) MCG/ACT IN AERS Inhalation Inhale 2 puffs into the lungs every 6 (six) hours as needed. For wheezing    . ATORVASTATIN CALCIUM 20 MG PO TABS Oral Take 20 mg by mouth daily.    Marland Kitchen DEXTROMETHORPHAN-GUAIFENESIN 10-100 MG/5ML PO LIQD Oral Take 5 mLs by mouth every 4 (four) hours as needed. For cough    . ERGOCALCIFEROL 50000 UNITS PO CAPS Oral Take 50,000 Units by mouth once a week.    . FUROSEMIDE 20 MG PO TABS Oral Take 20 mg by mouth daily.    Marland Kitchen GABAPENTIN 300 MG PO CAPS Oral Take 300 mg by mouth 2 (two)  times daily.    Marland Kitchen HYDROCHLOROTHIAZIDE 25 MG PO TABS Oral Take 25 mg by mouth daily.    . INSULIN DETEMIR 100 UNIT/ML [place] SOLN Subcutaneous Inject 50 Units into the skin 2 (two) times daily.    Marland Kitchen LISINOPRIL 10 MG PO TABS Oral Take 10 mg by mouth daily.    Marland Kitchen LORATADINE 10 MG PO TABS Oral Take 10 mg by mouth daily.    Marland Kitchen MENTHOL 7.6 MG MT LOZG Mouth/Throat Use as directed 1 lozenge in the mouth or throat 3 (three) times daily as needed. For cough    . NAPROXEN 500 MG PO TABS Oral Take 500 mg by mouth daily before lunch.    Marland Kitchen PANTOPRAZOLE SODIUM 40 MG PO TBEC Oral Take 40 mg by mouth daily.    Marland Kitchen POTASSIUM CHLORIDE ER 10 MEQ PO TBCR Oral Take 20 mEq by mouth 2 (two) times daily.    Marland Kitchen PRAVASTATIN SODIUM 40 MG PO TABS Oral Take 40 mg by mouth at bedtime.    . TRIAMCINOLONE ACETONIDE 55 MCG/ACT NA INHA Nasal Place 2 sprays into the nose daily as needed. For allergies    . AZITHROMYCIN 250 MG PO TABS Oral Take 1 tablet (250 mg total) by mouth  daily. Take first 2 tablets together, then 1 every day until finished. 5 tablet 0  . GUAIFENESIN-CODEINE 100-10 MG/5ML PO SYRP Oral Take 5 mLs by mouth 3 (three) times daily as needed for cough. 120 mL 0    BP 137/74  Pulse 72  Temp(Src) 98.6 F (37 C) (Oral)  Resp 18  SpO2 100%  Physical Exam  Nursing note and vitals reviewed. Constitutional: She appears well-developed and well-nourished. No distress.  HENT:  Head: Normocephalic and atraumatic.  Eyes: Pupils are equal, round, and reactive to light.  Neck: Normal range of motion. Neck supple.  Cardiovascular: Normal rate and regular rhythm.   Pulmonary/Chest: Effort normal. She has wheezes (coughing during exam).  Abdominal: Soft.  Neurological: She is alert.  Skin: Skin is warm and dry.    ED Course  Procedures (including critical care time)  Labs Reviewed  URINALYSIS, ROUTINE W REFLEX MICROSCOPIC - Abnormal; Notable for the following:    Glucose, UA >1000 (*)    All other components within  normal limits  GLUCOSE, CAPILLARY - Abnormal; Notable for the following:    Glucose-Capillary 223 (*)    All other components within normal limits  URINE MICROSCOPIC-ADD ON - Abnormal; Notable for the following:    Squamous Epithelial / LPF MANY (*)    All other components within normal limits   Dg Chest 2 View  10/24/2011  *RADIOLOGY REPORT*  Clinical Data: Chest congestion and shortness of breath.  CHEST - 2 VIEW  Comparison: 01/17/2010  Findings: The lungs are clear without focal infiltrate, edema, pneumothorax or pleural effusion. Interstitial markings are diffusely coarsened with chronic features. The cardiopericardial silhouette is within normal limits for size. Imaged bony structures of the thorax are intact.  IMPRESSION: No acute cardiopulmonary process.  Original Report Authenticated By: ERIC A. MANSELL, M.D.     1. URI (upper respiratory infection)       MDM  Pt wheezing during exam, blood sugars are < 250. Chest xray is negative. Patient received regular albut/atrovent neb 15 minutes and then an hour long neb. This really helped to resolve the patients wheezing significantly and she states that she is feeling much better.  Will cover patient with abx due to PMH of diabetes.   Pt given Rx for Azithromycin and robitussin with codeine. Pt instructed to continue using inhaler        Dorthula Matas, PA 10/24/11 2017

## 2011-10-24 NOTE — Discharge Instructions (Signed)
Cool Mist Vaporizers Vaporizers may help relieve the symptoms of a cough and cold. By adding water to the air, mucus may become thinner and less sticky. This makes it easier to breathe and cough up secretions. Vaporizers have not been proven to show they help with colds. You should not use a vaporizer if you are allergic to mold. Cool mist vaporizers do not cause serious burns like hot mist vaporizers ("steamers"). HOME CARE INSTRUCTIONS  Follow the package instructions for your vaporizer.   Use a vaporizer that holds a large volume of water (1 to 2 gallons [5.7 to 7.5 liters]).   Do not use anything other than distilled water in the vaporizer.   Do not run the vaporizer all of the time. This can cause mold or bacteria to grow in the vaporizer.   Clean the vaporizer after each time you use it.   Clean and dry the vaporizer well before you store it.   Stop using a vaporizer if you develop worsening respiratory symptoms.  Document Released: 05/01/2004 Document Revised: 07/24/2011 Document Reviewed: 03/29/2009 [facility] Patient Information 2012 [place], [place].Upper Respiratory Infection, Adult An upper respiratory infection (URI) is also sometimes known as the common cold. The upper respiratory tract includes the nose, sinuses, throat, trachea, and bronchi. Bronchi are the airways leading to the lungs. Most people improve within 1 week, but symptoms can last up to 2 weeks. A residual cough may last even longer.  CAUSES Many different viruses can infect the tissues lining the upper respiratory tract. The tissues become irritated and inflamed and often become very moist. Mucus production is also common. A cold is contagious. You can easily spread the virus to others by oral contact. This includes kissing, sharing a glass, coughing, or sneezing. Touching your mouth or nose and then touching a surface, which is then touched by another person, can also spread the virus. SYMPTOMS  Symptoms typically  develop 1 to 3 days after you come in contact with a cold virus. Symptoms vary from person to person. They may include:  Runny nose.   Sneezing.   Nasal congestion.   Sinus irritation.   Sore throat.   Loss of voice (laryngitis).   Cough.   Fatigue.   Muscle aches.   Loss of appetite.   Headache.   Low-grade fever.  DIAGNOSIS  You might diagnose your own cold based on familiar symptoms, since most people get a cold 2 to 3 times a year. Your caregiver can confirm this based on your exam. Most importantly, your caregiver can check that your symptoms are not due to another disease such as strep throat, sinusitis, pneumonia, asthma, or epiglottitis. Blood tests, throat tests, and X-rays are not necessary to diagnose a common cold, but they may sometimes be helpful in excluding other more serious diseases. Your caregiver will decide if any further tests are required. RISKS AND COMPLICATIONS  You may be at risk for a more severe case of the common cold if you smoke cigarettes, have chronic heart disease (such as heart failure) or lung disease (such as asthma), or if you have a weakened immune system. The very young and very old are also at risk for more serious infections. Bacterial sinusitis, middle ear infections, and bacterial pneumonia can complicate the common cold. The common cold can worsen asthma and chronic obstructive pulmonary disease (COPD). Sometimes, these complications can require emergency medical care and may be life-threatening. PREVENTION  The best way to protect against getting a cold is to practice  good hygiene. Avoid oral or hand contact with people with cold symptoms. Wash your hands often if contact occurs. There is no clear evidence that vitamin C, vitamin E, echinacea, or exercise reduces the chance of developing a cold. However, it is always recommended to get plenty of rest and practice good nutrition. TREATMENT  Treatment is directed at relieving symptoms. There  is no cure. Antibiotics are not effective, because the infection is caused by a virus, not by bacteria. Treatment may include:  Increased fluid intake. Sports drinks offer valuable electrolytes, sugars, and fluids.   Breathing heated mist or steam (vaporizer or shower).   Eating chicken soup or other clear broths, and maintaining good nutrition.   Getting plenty of rest.   Using gargles or lozenges for comfort.   Controlling fevers with ibuprofen or acetaminophen as directed by your caregiver.   Increasing usage of your inhaler if you have asthma.  Zinc gel and zinc lozenges, taken in the first 24 hours of the common cold, can shorten the duration and lessen the severity of symptoms. Pain medicines may help with fever, muscle aches, and throat pain. A variety of non-prescription medicines are available to treat congestion and runny nose. Your caregiver can make recommendations and may suggest nasal or lung inhalers for other symptoms.  HOME CARE INSTRUCTIONS   Only take over-the-counter or prescription medicines for pain, discomfort, or fever as directed by your caregiver.   Use a warm mist humidifier or inhale steam from a shower to increase air moisture. This may keep secretions moist and make it easier to breathe.   Drink enough water and fluids to keep your urine clear or pale yellow.   Rest as needed.   Return to work when your temperature has returned to normal or as your caregiver advises. You may need to stay home longer to avoid infecting others. You can also use a face mask and careful hand washing to prevent spread of the virus.  SEEK MEDICAL CARE IF:   After the first few days, you feel you are getting worse rather than better.   You need your caregiver's advice about medicines to control symptoms.   You develop chills, worsening shortness of breath, or brown or red sputum. These may be signs of pneumonia.   You develop yellow or brown nasal discharge or pain in the  face, especially when you bend forward. These may be signs of sinusitis.   You develop a fever, swollen neck glands, pain with swallowing, or white areas in the back of your throat. These may be signs of strep throat.  SEEK IMMEDIATE MEDICAL CARE IF:   You have a fever.   You develop severe or persistent headache, ear pain, sinus pain, or chest pain.   You develop wheezing, a prolonged cough, cough up blood, or have a change in your usual mucus (if you have chronic lung disease).   You develop sore muscles or a stiff neck.  Document Released: 01/28/2001 Document Revised: 07/24/2011 Document Reviewed: 12/06/2010 [facility] Patient Information 2012 [place], [place].

## 2011-10-24 NOTE — ED Notes (Signed)
She has a cold coughing chest congestion. Asthmatic.

## 2011-10-25 NOTE — ED Provider Notes (Signed)
Medical screening examination/treatment/procedure(s) were performed by non-physician practitioner and as supervising physician I was immediately available for consultation/collaboration.   Calub Tarnow A Kechia Yahnke, MD 10/25/11 1529 

## 2011-11-11 ENCOUNTER — Other Ambulatory Visit: Payer: Self-pay | Admitting: Family Medicine

## 2011-12-16 ENCOUNTER — Other Ambulatory Visit: Payer: Self-pay | Admitting: Family Medicine

## 2012-01-14 ENCOUNTER — Other Ambulatory Visit: Payer: Self-pay | Admitting: Family Medicine

## 2012-03-12 ENCOUNTER — Other Ambulatory Visit: Payer: Self-pay | Admitting: Physician Assistant

## 2012-03-18 ENCOUNTER — Other Ambulatory Visit: Payer: Self-pay | Admitting: Physician Assistant

## 2012-03-19 NOTE — Telephone Encounter (Signed)
No paper chart °

## 2012-04-01 ENCOUNTER — Other Ambulatory Visit: Payer: Self-pay | Admitting: Physician Assistant

## 2012-04-14 ENCOUNTER — Emergency Department (INDEPENDENT_AMBULATORY_CARE_PROVIDER_SITE_OTHER): Payer: Medicare Other

## 2012-04-14 ENCOUNTER — Emergency Department (INDEPENDENT_AMBULATORY_CARE_PROVIDER_SITE_OTHER)
Admission: EM | Admit: 2012-04-14 | Discharge: 2012-04-14 | Disposition: A | Discharge status: Home / Self Care | Payer: Medicare Other | Source: Home / Self Care | Attending: Emergency Medicine | Admitting: Emergency Medicine

## 2012-04-14 ENCOUNTER — Encounter (HOSPITAL_COMMUNITY): Payer: Self-pay | Admitting: *Deleted

## 2012-04-14 DIAGNOSIS — M1711 Unilateral primary osteoarthritis, right knee: Secondary | ICD-10-CM

## 2012-04-14 DIAGNOSIS — K2970 Gastritis, unspecified, without bleeding: Secondary | ICD-10-CM

## 2012-04-14 DIAGNOSIS — M1710 Unilateral primary osteoarthritis, unspecified knee: Secondary | ICD-10-CM

## 2012-04-14 HISTORY — DX: Unspecified asthma, uncomplicated: J45.909

## 2012-04-14 HISTORY — DX: Unspecified osteoarthritis, unspecified site: M19.90

## 2012-04-14 HISTORY — DX: Obesity, unspecified: E66.9

## 2012-04-14 HISTORY — DX: Heart failure, unspecified: I50.9

## 2012-04-14 LAB — POCT URINALYSIS DIP (DEVICE)
Bilirubin Urine: NEGATIVE
Glucose, UA: NEGATIVE mg/dL
Nitrite: NEGATIVE
Urobilinogen, UA: 0.2 mg/dL (ref 0.0–1.0)

## 2012-04-14 LAB — POCT I-STAT, CHEM 8
Calcium, Ion: 1.22 mmol/L (ref 1.12–1.23)
Chloride: 100 mEq/L (ref 96–112)
HCT: 40 % (ref 36.0–46.0)
Hemoglobin: 13.6 g/dL (ref 12.0–15.0)
Potassium: 3.9 mEq/L (ref 3.5–5.1)

## 2012-04-14 MED ORDER — FAMOTIDINE 20 MG PO TABS: 20.0000 mg | ORAL_TABLET | Freq: Two times a day (BID) | ORAL | Status: DC

## 2012-04-14 MED ORDER — MELOXICAM 7.5 MG PO TABS: 7.5000 mg | ORAL_TABLET | Freq: Every day | ORAL | Status: DC

## 2012-04-14 MED ORDER — HYDROCODONE-ACETAMINOPHEN 5-325 MG PO TABS: 2.0000 | ORAL_TABLET | ORAL | Status: AC | PRN

## 2012-04-14 MED ORDER — DICLOFENAC SODIUM 1 % TD GEL: 1.0000 "application " | Freq: Four times a day (QID) | TRANSDERMAL | Status: DC

## 2012-04-14 NOTE — ED Notes (Signed)
Pregnancy test cancelled b/c pt has had a hysterectomy.

## 2012-04-14 NOTE — ED Provider Notes (Signed)
History     CSN: [id number]  Arrival date & time 04/14/12  1539   First MD Initiated Contact with Patient 04/14/12 1631      Chief Complaint  Patient presents with  . Knee Pain    (Consider location/radiation/quality/duration/timing/severity/associated sxs/prior treatment) HPI Comments: Patient presents with multiple complaints: First, patient reports atraumatic  right knee pain for the past 2 weeks. Reports some medial knee swelling as well. Pain is worse with standing for prolonged periods of time, walking, going up and downstairs. She has been trying to "stay off of it" and has been taking "an arthritis medication", which she takes for her arthritis in her back and knees. No erythema, increased temperature. No fevers, paresthesias, distal weakness. No popping, clicking, history of knee giving way. No change in physical activity. She is diabetic, states that her glucose has been ranging within normal limits for her, which is generally 200s. No history of gout. Second, She is also states that her lower back has been bothering her during this time. No pelvic pain, urinary complaints, change in bowel habits. No recent trauma to her back. The third, patient reports nausea, decreased appetite, upper abdominal pain, malaise, dry mouth,  while taking her "arthritis medication". No vomiting, abdominal distention. She says she is taking 2 pills several times a day because it is "no longer working". When asked specifically what this medication is, she shows me her Neurontin bottle. She has a record of being on Naprosyn, but she is not sure if she's been taking this. She has not tried any other NSAIDs for her pain.   ROS as noted in HPI. All other ROS negative.   Patient is a 51 y.o. female presenting with knee pain and abdominal pain. The history is provided by the patient. No language interpreter was used.  Knee Pain This is a chronic problem. The problem occurs constantly. The problem has been  gradually worsening. Associated symptoms include abdominal pain. The symptoms are aggravated by walking and standing. Nothing relieves the symptoms. Treatments tried: naprosyn. The treatment provided no relief.  Abdominal Pain The primary symptoms of the illness include abdominal pain.    Past Medical History  Diagnosis Date  . Hypertension   . Diabetes mellitus   . Coronary artery disease   . Asthma   . CHF (congestive heart failure)   . Arthritis   . Obesity     Past Surgical History  Procedure Date  . Eye surgery   . Abdominal hysterectomy     Family History  Problem Relation Age of Onset  . Stroke Mother     History  Substance Use Topics  . Smoking status: Never Smoker   . Smokeless tobacco: Not on file  . Alcohol Use: No    OB History    Grav Para Term Preterm Abortions TAB SAB Ect Mult Living                  Review of Systems  Gastrointestinal: Positive for abdominal pain.    Allergies  Penicillins and Tetracycline  Home Medications   Current Outpatient Rx  Name Route Sig Dispense Refill  . ALBUTEROL SULFATE HFA 108 (90 BASE) MCG/ACT IN AERS Inhalation Inhale 2 puffs into the lungs every 6 (six) hours as needed. For wheezing    . ATORVASTATIN CALCIUM 20 MG PO TABS Oral Take 20 mg by mouth daily.    . FUROSEMIDE 20 MG PO TABS Oral Take 20 mg by mouth daily.    Marland Kitchen  HYDROCHLOROTHIAZIDE 25 MG PO TABS Oral Take 25 mg by mouth daily.    . INSULIN DETEMIR 100 UNIT/ML [place] SOLN Subcutaneous Inject 50 Units into the skin 2 (two) times daily.    Marland Kitchen KLOR-CON M10 10 MEQ PO TBCR  TAKE TWO TABLETS BY MOUTH EVERY DAY 60 each 1  . LISINOPRIL 10 MG PO TABS Oral Take 10 mg by mouth daily.    Marland Kitchen LORATADINE 10 MG PO TABS  TAKE ONE TABLET BY MOUTH EVERY DAY 30 tablet 5  . PANTOPRAZOLE SODIUM 40 MG PO TBEC Oral Take 40 mg by mouth daily.    Marland Kitchen POTASSIUM CHLORIDE ER 10 MEQ PO TBCR Oral Take 20 mEq by mouth 2 (two) times daily.    Marland Kitchen PRAVASTATIN SODIUM 40 MG PO TABS  TAKE ONE  TABLET BY MOUTH EVERY DAY IN THE EVENING 30 tablet 1  . TRIAMCINOLONE ACETONIDE 55 MCG/ACT NA INHA Nasal Place 2 sprays into the nose daily as needed. For allergies    . DICLOFENAC SODIUM 1 % TD GEL Topical Apply 1 application topically 4 (four) times daily. 100 g 0  . FAMOTIDINE 20 MG PO TABS Oral Take 1 tablet (20 mg total) by mouth 2 (two) times daily. 30 tablet 0  . HYDROCODONE-ACETAMINOPHEN 5-325 MG PO TABS Oral Take 2 tablets by mouth every 4 (four) hours as needed for pain. 20 tablet 0  . MELOXICAM 7.5 MG PO TABS Oral Take 1 tablet (7.5 mg total) by mouth daily. 14 tablet 0    BP 150/95  Pulse 88  Temp 98.4 F (36.9 C) (Oral)  Resp 18  SpO2 99%  Physical Exam  Nursing note and vitals reviewed. Constitutional: She is oriented to person, place, and time. She appears well-developed and well-nourished. No distress.       Morbidly obese  HENT:  Head: Normocephalic and atraumatic.  Eyes: Conjunctivae and EOM are normal.  Neck: Normal range of motion.  Cardiovascular: Normal rate and normal heart sounds.   Pulmonary/Chest: Effort normal and breath sounds normal.  Abdominal: Soft. Bowel sounds are normal. She exhibits no distension and no mass. There is tenderness. There is no rebound, no guarding and no CVA tenderness.          Mild gastric tenderness.  Musculoskeletal: Normal range of motion.       Right knee: tenderness found. Medial joint line tenderness noted.       No appreciable effusion, swelling, erythema increased temperature compared to other knee. + crepitus. R Knee ROM baseline for PT,  Flexion/ extension  Intact,  Patella NT, Patellar apprehension test negative, Patellar tendon NT, Medial joint  tender, Lateral joint NT, Popliteal region NT, Lachman's stable, Varus stress testing stable, Valgus stress testing stable but painful, McMurray's testing normal , distal NVI with intact baseline sensation / motor / pulse distal to knee  Neurological: She is alert and oriented to  person, place, and time. Coordination normal.       antalgic gait  Skin: Skin is warm and dry.  Psychiatric: She has a normal mood and affect. Her behavior is normal. Judgment and thought content normal.    ED Course  Procedures (including critical care time)  Labs Reviewed  POCT URINALYSIS DIP (DEVICE) - Abnormal; Notable for the following:    Leukocytes, UA TRACE (*)  Biochemical Testing Only. Please order routine urinalysis from main lab if confirmatory testing is needed.   All other components within normal limits  POCT I-STAT, CHEM 8 - Abnormal; Notable  for the following:    Glucose, Bld 179 (*)     All other components within normal limits   Dg Knee 2 Views Right  04/14/2012  *RADIOLOGY REPORT*  Clinical Data: Knee pain for 2 weeks.  RIGHT KNEE - 1-2 VIEW  Comparison: None.  Findings: There is no acute bony or joint abnormality.  The patient has marked tricompartmental degenerative disease which is worst in the medial compartment.  No joint effusion is identified.  IMPRESSION: No acute finding.  Tricompartmental osteoarthritis, worst in the medial compartment.   Original Report Authenticated By: Bernadene Bell. D'ALESSIO, M.D.      1. Arthritis of right knee   2. Gastritis     MDM  Imaging reviewed by myself. DJD. No fx. Full report per radiologist.   Given h/o DM and HTN will check kidney function to guide further therapy.   Suspect pt has some component of gastritis from the excess Neurontin use. Think that her fatigue, dry mouth, malaise also related to this. She states she is not taking Naprosyn. Will start topical Voltaren. Will have her continue Pepto-Bismol, protonix. Will start her on some Pepcid. Once her gastritis is improved, advised her that she can start Mobic, and have her discontinue the topical Voltaren at that time. Back pain is most likely from altered gait due to her knee pain.  Placing in knee immobilizer, cane.  We'll refer her to Dr. Carola Frost, orthopedic surgeon on  call if no improvement with conservative therapy. Discussed signs and symptoms that should prompt return to the department. Patient agrees with plan.  Luiz Blare, MD 04/14/12 2024

## 2012-04-14 NOTE — ED Notes (Signed)
Pt reports 2 weeks of right knee pain, low back pain with bilateral radiation.  Also c/o 2 weeks of lowers ABD pain which has been relieved with Pepto-bismol

## 2012-04-16 ENCOUNTER — Emergency Department (HOSPITAL_COMMUNITY)
Admission: EM | Admit: 2012-04-16 | Discharge: 2012-04-17 | Disposition: A | Discharge status: Home / Self Care | Payer: Medicare Other | Attending: Emergency Medicine | Admitting: Emergency Medicine

## 2012-04-16 ENCOUNTER — Encounter (HOSPITAL_COMMUNITY): Payer: Self-pay | Admitting: *Deleted

## 2012-04-16 DIAGNOSIS — I2510 Atherosclerotic heart disease of native coronary artery without angina pectoris: Secondary | ICD-10-CM | POA: Insufficient documentation

## 2012-04-16 DIAGNOSIS — Z794 Long term (current) use of insulin: Secondary | ICD-10-CM | POA: Insufficient documentation

## 2012-04-16 DIAGNOSIS — Z881 Allergy status to other antibiotic agents status: Secondary | ICD-10-CM | POA: Insufficient documentation

## 2012-04-16 DIAGNOSIS — J45909 Unspecified asthma, uncomplicated: Secondary | ICD-10-CM | POA: Insufficient documentation

## 2012-04-16 DIAGNOSIS — E669 Obesity, unspecified: Secondary | ICD-10-CM | POA: Insufficient documentation

## 2012-04-16 DIAGNOSIS — Z880 Allergy status to penicillin: Secondary | ICD-10-CM | POA: Insufficient documentation

## 2012-04-16 DIAGNOSIS — E119 Type 2 diabetes mellitus without complications: Secondary | ICD-10-CM | POA: Insufficient documentation

## 2012-04-16 DIAGNOSIS — N390 Urinary tract infection, site not specified: Secondary | ICD-10-CM | POA: Insufficient documentation

## 2012-04-16 DIAGNOSIS — I10 Essential (primary) hypertension: Secondary | ICD-10-CM | POA: Insufficient documentation

## 2012-04-16 NOTE — ED Notes (Signed)
Pt c/o abd pain x 2 wks; nausea after eating; no energy; going out of town tomorrow and wanted it checked before she left

## 2012-04-17 LAB — CBC WITH DIFFERENTIAL/PLATELET
Eosinophils Absolute: 0.2 10*3/uL (ref 0.0–0.7)
Eosinophils Relative: 2 % (ref 0–5)
HCT: 36.7 % (ref 36.0–46.0)
Hemoglobin: 11.8 g/dL — ABNORMAL LOW (ref 12.0–15.0)
Lymphocytes Relative: 34 % (ref 12–46)
Lymphs Abs: 3 10*3/uL (ref 0.7–4.0)
MCH: 25.9 pg — ABNORMAL LOW (ref 26.0–34.0)
MCV: 80.7 fL (ref 78.0–100.0)
Monocytes Absolute: 0.5 10*3/uL (ref 0.1–1.0)
Monocytes Relative: 5 % (ref 3–12)
RBC: 4.55 MIL/uL (ref 3.87–5.11)
WBC: 8.9 10*3/uL (ref 4.0–10.5)

## 2012-04-17 LAB — COMPREHENSIVE METABOLIC PANEL
AST: 23 U/L (ref 0–37)
Albumin: 3.8 g/dL (ref 3.5–5.2)
BUN: 17 mg/dL (ref 6–23)
Calcium: 10.2 mg/dL (ref 8.4–10.5)
Chloride: 95 mEq/L — ABNORMAL LOW (ref 96–112)
Creatinine, Ser: 0.71 mg/dL (ref 0.50–1.10)
Total Bilirubin: 0.1 mg/dL — ABNORMAL LOW (ref 0.3–1.2)
Total Protein: 8.2 g/dL (ref 6.0–8.3)

## 2012-04-17 LAB — URINALYSIS, ROUTINE W REFLEX MICROSCOPIC
Bilirubin Urine: NEGATIVE
Ketones, ur: NEGATIVE mg/dL
Nitrite: NEGATIVE
Specific Gravity, Urine: 1.015 (ref 1.005–1.030)
Urobilinogen, UA: 0.2 mg/dL (ref 0.0–1.0)
pH: 5 (ref 5.0–8.0)

## 2012-04-17 LAB — URINE MICROSCOPIC-ADD ON

## 2012-04-17 MED ORDER — SULFAMETHOXAZOLE-TMP DS 800-160 MG PO TABS: 1.0000 | ORAL_TABLET | Freq: Two times a day (BID) | ORAL | Status: AC

## 2012-04-17 MED ORDER — SULFAMETHOXAZOLE-TMP DS 800-160 MG PO TABS
1.0000 | ORAL_TABLET | Freq: Once | ORAL | Status: AC
  Administered 2012-04-17: 1 via ORAL
  Filled 2012-04-17: qty 1

## 2012-04-17 MED ORDER — ONDANSETRON 4 MG PO TBDP
4.0000 mg | ORAL_TABLET | Freq: Once | ORAL | Status: AC
  Administered 2012-04-17: 4 mg via ORAL
  Filled 2012-04-17: qty 1

## 2012-04-17 NOTE — ED Provider Notes (Signed)
History     CSN: [id number]  Arrival date & time 04/16/12  2231   First MD Initiated Contact with Patient 04/17/12 0121      Chief Complaint  Patient presents with  . Abdominal Pain    (Consider location/radiation/quality/duration/timing/severity/associated sxs/prior treatment) HPI Comments: [name] is a 51 year old, obese, African American female, with complaint of generalized abdominal pain for the past 2, weeks, nausea after eating, feeling drained, and puffy.  She has not seen her primary care provider for this  Patient is a 51 y.o. female presenting with abdominal pain. The history is provided by the patient.  Abdominal Pain The primary symptoms of the illness include abdominal pain and nausea. The primary symptoms of the illness do not include fever, vomiting, diarrhea, dysuria or vaginal discharge. The current episode started more than 2 days ago. The onset of the illness was gradual. The problem has not changed since onset. Symptoms associated with the illness do not include chills.    Past Medical History  Diagnosis Date  . Hypertension   . Diabetes mellitus   . Coronary artery disease   . Asthma   . CHF (congestive heart failure)   . Arthritis   . Obesity     Past Surgical History  Procedure Date  . Eye surgery   . Abdominal hysterectomy     Family History  Problem Relation Age of Onset  . Stroke Mother     History  Substance Use Topics  . Smoking status: Never Smoker   . Smokeless tobacco: Not on file  . Alcohol Use: No    OB History    Grav Para Term Preterm Abortions TAB SAB Ect Mult Living                  Review of Systems  Constitutional: Negative for fever and chills.  Gastrointestinal: Positive for nausea and abdominal pain. Negative for vomiting and diarrhea.  Genitourinary: Negative for dysuria and vaginal discharge.  Musculoskeletal: Positive for myalgias.  Skin: Negative for rash and wound.  Neurological: Negative for dizziness,  weakness and headaches.    Allergies  Penicillins and Tetracycline  Home Medications   Current Outpatient Rx  Name Route Sig Dispense Refill  . ALBUTEROL SULFATE HFA 108 (90 BASE) MCG/ACT IN AERS Inhalation Inhale 2 puffs into the lungs every 6 (six) hours as needed. For wheezing    . ATORVASTATIN CALCIUM 20 MG PO TABS Oral Take 20 mg by mouth daily.    Marland Kitchen FAMOTIDINE 20 MG PO TABS Oral Take 20 mg by mouth daily.    . FUROSEMIDE 20 MG PO TABS Oral Take 20 mg by mouth daily.    Marland Kitchen HYDROCHLOROTHIAZIDE 25 MG PO TABS Oral Take 25 mg by mouth daily.    . INSULIN DETEMIR 100 UNIT/ML [place] SOLN Subcutaneous Inject 60-65 Units into the skin 2 (two) times daily. 65 units in the morning and 60 units at night.    Marland Kitchen LISINOPRIL 10 MG PO TABS Oral Take 10 mg by mouth daily.    Marland Kitchen LORATADINE 10 MG PO TABS Oral Take 10 mg by mouth daily.    Marland Kitchen PANTOPRAZOLE SODIUM 40 MG PO TBEC Oral Take 40 mg by mouth daily.    Marland Kitchen POTASSIUM CHLORIDE ER 10 MEQ PO TBCR Oral Take 20 mEq by mouth 2 (two) times daily.    Marland Kitchen PRAVASTATIN SODIUM 40 MG PO TABS Oral Take 40 mg by mouth daily.    . TRIAMCINOLONE ACETONIDE 55 MCG/ACT NA INHA  Nasal Place 2 sprays into the nose daily as needed. For allergies    . DICLOFENAC SODIUM 1 % TD GEL Topical Apply 1 application topically 4 (four) times daily. 100 g 0  . HYDROCODONE-ACETAMINOPHEN 5-325 MG PO TABS Oral Take 2 tablets by mouth every 4 (four) hours as needed for pain. 20 tablet 0  . MELOXICAM 7.5 MG PO TABS Oral Take 1 tablet (7.5 mg total) by mouth daily. 14 tablet 0    BP 155/79  Pulse 82  Temp 98.1 F (36.7 C) (Oral)  Resp 20  SpO2 100%  Physical Exam  Constitutional: She is oriented to person, place, and time. She appears well-developed and well-nourished.  HENT:  Head: Normocephalic.  Eyes: Pupils are equal, round, and reactive to light.  Neck: Normal range of motion.  Cardiovascular: Normal rate.   Pulmonary/Chest: Effort normal.  Abdominal: Soft. She exhibits no  distension. There is no tenderness.  Musculoskeletal: Normal range of motion. She exhibits no edema.  Neurological: She is alert and oriented to person, place, and time.  Skin: Skin is warm.    ED Course  Procedures (including critical care time)  Labs Reviewed  URINALYSIS, ROUTINE W REFLEX MICROSCOPIC - Abnormal; Notable for the following:    Leukocytes, UA SMALL (*)     All other components within normal limits  COMPREHENSIVE METABOLIC PANEL - Abnormal; Notable for the following:    Chloride 95 (*)     Glucose, Bld 228 (*)     Total Bilirubin 0.1 (*)     All other components within normal limits  CBC WITH DIFFERENTIAL - Abnormal; Notable for the following:    Hemoglobin 11.8 (*)     MCH 25.9 (*)     All other components within normal limits  URINE MICROSCOPIC-ADD ON - Abnormal; Notable for the following:    Bacteria, UA MANY (*)     All other components within normal limits   No results found.   No diagnosis found.    MDM   After patient examination, and review of labs it appears that this patient has a urinary tract infection.  We'll treat with Septra and Zofran for nausea        Arman Filter, NP 04/17/12 [phone]

## 2012-04-21 ENCOUNTER — Other Ambulatory Visit: Payer: Self-pay | Admitting: Family Medicine

## 2012-04-21 NOTE — ED Provider Notes (Signed)
Medical screening examination/treatment/procedure(s) were performed by non-physician practitioner and as supervising physician I was immediately available for consultation/collaboration.  Tamzin Bertling, MD 04/21/12 0534 

## 2012-05-18 ENCOUNTER — Other Ambulatory Visit: Payer: Self-pay | Admitting: Internal Medicine

## 2012-05-18 ENCOUNTER — Other Ambulatory Visit (HOSPITAL_COMMUNITY): Payer: Self-pay | Admitting: Internal Medicine

## 2012-05-18 DIAGNOSIS — Z1231 Encounter for screening mammogram for malignant neoplasm of breast: Secondary | ICD-10-CM

## 2012-06-02 ENCOUNTER — Ambulatory Visit (HOSPITAL_COMMUNITY)
Admission: RE | Admit: 2012-06-02 | Discharge: 2012-06-02 | Disposition: A | Discharge status: Home / Self Care | Payer: Medicare Other | Source: Ambulatory Visit | Attending: Internal Medicine | Admitting: Internal Medicine

## 2012-06-02 DIAGNOSIS — Z1231 Encounter for screening mammogram for malignant neoplasm of breast: Secondary | ICD-10-CM | POA: Insufficient documentation

## 2012-10-08 ENCOUNTER — Encounter (HOSPITAL_COMMUNITY): Payer: Self-pay | Admitting: *Deleted

## 2012-10-08 ENCOUNTER — Emergency Department (HOSPITAL_COMMUNITY)
Admission: EM | Admit: 2012-10-08 | Discharge: 2012-10-08 | Disposition: A | Discharge status: Home / Self Care | Payer: Medicare Other | Attending: Emergency Medicine | Admitting: Emergency Medicine

## 2012-10-08 ENCOUNTER — Emergency Department (HOSPITAL_COMMUNITY): Payer: Medicare Other

## 2012-10-08 DIAGNOSIS — Z8739 Personal history of other diseases of the musculoskeletal system and connective tissue: Secondary | ICD-10-CM | POA: Insufficient documentation

## 2012-10-08 DIAGNOSIS — H6092 Unspecified otitis externa, left ear: Secondary | ICD-10-CM

## 2012-10-08 DIAGNOSIS — M79609 Pain in unspecified limb: Secondary | ICD-10-CM | POA: Insufficient documentation

## 2012-10-08 DIAGNOSIS — M79606 Pain in leg, unspecified: Secondary | ICD-10-CM

## 2012-10-08 DIAGNOSIS — R3915 Urgency of urination: Secondary | ICD-10-CM | POA: Insufficient documentation

## 2012-10-08 DIAGNOSIS — E119 Type 2 diabetes mellitus without complications: Secondary | ICD-10-CM | POA: Insufficient documentation

## 2012-10-08 DIAGNOSIS — Z794 Long term (current) use of insulin: Secondary | ICD-10-CM | POA: Insufficient documentation

## 2012-10-08 DIAGNOSIS — Z79899 Other long term (current) drug therapy: Secondary | ICD-10-CM | POA: Insufficient documentation

## 2012-10-08 DIAGNOSIS — R300 Dysuria: Secondary | ICD-10-CM | POA: Insufficient documentation

## 2012-10-08 DIAGNOSIS — N390 Urinary tract infection, site not specified: Secondary | ICD-10-CM | POA: Insufficient documentation

## 2012-10-08 DIAGNOSIS — M7989 Other specified soft tissue disorders: Secondary | ICD-10-CM

## 2012-10-08 DIAGNOSIS — I10 Essential (primary) hypertension: Secondary | ICD-10-CM | POA: Insufficient documentation

## 2012-10-08 DIAGNOSIS — J45909 Unspecified asthma, uncomplicated: Secondary | ICD-10-CM | POA: Insufficient documentation

## 2012-10-08 DIAGNOSIS — E669 Obesity, unspecified: Secondary | ICD-10-CM

## 2012-10-08 DIAGNOSIS — I509 Heart failure, unspecified: Secondary | ICD-10-CM | POA: Insufficient documentation

## 2012-10-08 DIAGNOSIS — H60399 Other infective otitis externa, unspecified ear: Secondary | ICD-10-CM | POA: Insufficient documentation

## 2012-10-08 DIAGNOSIS — I2510 Atherosclerotic heart disease of native coronary artery without angina pectoris: Secondary | ICD-10-CM | POA: Insufficient documentation

## 2012-10-08 LAB — CBC WITH DIFFERENTIAL/PLATELET
Eosinophils Relative: 3 % (ref 0–5)
HCT: 38.1 % (ref 36.0–46.0)
Lymphocytes Relative: 30 % (ref 12–46)
Lymphs Abs: 1.8 10*3/uL (ref 0.7–4.0)
MCV: 80.2 fL (ref 78.0–100.0)
Monocytes Absolute: 0.5 10*3/uL (ref 0.1–1.0)
Platelets: 252 10*3/uL (ref 150–400)
RBC: 4.75 MIL/uL (ref 3.87–5.11)
WBC: 6.1 10*3/uL (ref 4.0–10.5)

## 2012-10-08 LAB — URINALYSIS, ROUTINE W REFLEX MICROSCOPIC
Glucose, UA: NEGATIVE mg/dL
Hgb urine dipstick: NEGATIVE
Specific Gravity, Urine: 1.023 (ref 1.005–1.030)

## 2012-10-08 LAB — COMPREHENSIVE METABOLIC PANEL
ALT: 21 U/L (ref 0–35)
CO2: 30 mEq/L (ref 19–32)
Calcium: 9.7 mg/dL (ref 8.4–10.5)
GFR calc Af Amer: >90 mL/min (ref >90)
GFR calc non Af Amer: >90 mL/min (ref >90)
Glucose, Bld: 171 mg/dL — ABNORMAL HIGH (ref 70–99)
Sodium: 136 mEq/L (ref 135–145)
Total Bilirubin: 0.3 mg/dL (ref 0.3–1.2)

## 2012-10-08 LAB — URINE MICROSCOPIC-ADD ON

## 2012-10-08 MED ORDER — CEPHALEXIN 500 MG PO CAPS: 500.0000 mg | ORAL_CAPSULE | Freq: Two times a day (BID) | ORAL | Status: DC

## 2012-10-08 MED ORDER — HYDROCODONE-ACETAMINOPHEN 5-325 MG PO TABS: 2.0000 | ORAL_TABLET | ORAL | Status: DC | PRN

## 2012-10-08 MED ORDER — CIPROFLOXACIN-HYDROCORTISONE 0.2-1 % OT SUSP: 3.0000 [drp] | Freq: Two times a day (BID) | OTIC | Status: DC

## 2012-10-08 MED ORDER — IBUPROFEN 400 MG PO TABS: 400.0000 mg | ORAL_TABLET | Freq: Four times a day (QID) | ORAL | Status: DC | PRN

## 2012-10-08 MED ORDER — HYDROCODONE-ACETAMINOPHEN 5-325 MG PO TABS
2.0000 | ORAL_TABLET | Freq: Once | ORAL | Status: AC
  Administered 2012-10-08: 2 via ORAL
  Filled 2012-10-08: qty 2

## 2012-10-08 MED ORDER — ONDANSETRON 8 MG PO TBDP
8.0000 mg | ORAL_TABLET | Freq: Once | ORAL | Status: AC
  Administered 2012-10-08: 8 mg via ORAL
  Filled 2012-10-08: qty 1

## 2012-10-08 NOTE — ED Notes (Signed)
Patient transported to X-ray 

## 2012-10-08 NOTE — Progress Notes (Signed)
Right:  No evidence of DVT, superficial thrombosis, or Baker's cyst.  Left:  Negative for DVT in the common femoral vein.  

## 2012-10-08 NOTE — ED Provider Notes (Signed)
History     CSN: [id number]  Arrival date & time 10/08/12  1329   First MD Initiated Contact with Patient 10/08/12 1505      Chief Complaint  Patient presents with  . Leg Swelling  . Otalgia  . Dysuria    (Consider location/radiation/quality/duration/timing/severity/associated sxs/prior treatment) HPI Comments: Pt comes in with cc of left sided ear ache, right sided leg pain, and some pressure sensation with urination. Pt has hx of DM, CHF, HTN, Asthma. Pt reports that she has been having leg pain for the past week. The pain is sharp, and is in the entire right lower extremity -and is constant, worse with ambulation. She has no hx of PE, DVT, and no risk factors for the same. Pt also has hx left ear ache x 3 days. She used peroxide to clear it up, but has persistent throbbing pain in the ear. No discharge, no tinnitus, loss of hearing. Pt also has some pressure like sensation everytime she has to go urinate. She has been having some urgency as well -without actually making urine. No abd pain, flank pain. No vaginal discharge.    Patient is a 52 y.o. female presenting with ear pain and dysuria. The history is provided by the patient.  Otalgia Associated symptoms: no abdominal pain, no cough, no diarrhea, no neck pain and no vomiting   Dysuria  Pertinent negatives include no nausea, no vomiting and no hematuria.    Past Medical History  Diagnosis Date  . Hypertension   . Diabetes mellitus   . Coronary artery disease   . Asthma   . CHF (congestive heart failure)   . Arthritis   . Obesity     Past Surgical History  Procedure Laterality Date  . Eye surgery    . Abdominal hysterectomy      Family History  Problem Relation Age of Onset  . Stroke Mother     History  Substance Use Topics  . Smoking status: Never Smoker   . Smokeless tobacco: Not on file  . Alcohol Use: No    OB History   Grav Para Term Preterm Abortions TAB SAB Ect Mult Living                   Review of Systems  Constitutional: Negative for activity change.  HENT: Positive for ear pain. Negative for facial swelling and neck pain.   Respiratory: Negative for cough, shortness of breath and wheezing.   Cardiovascular: Negative for chest pain.  Gastrointestinal: Negative for nausea, vomiting, abdominal pain, diarrhea, constipation, blood in stool and abdominal distention.  Genitourinary: Positive for dysuria. Negative for hematuria and difficulty urinating.  Musculoskeletal: Positive for myalgias.  Skin: Negative for color change.  Neurological: Negative for syncope and speech difficulty.  Hematological: Does not bruise/bleed easily.  Psychiatric/Behavioral: Negative for confusion.    Allergies  Penicillins and Tetracycline  Home Medications   Current Outpatient Rx  Name  Route  Sig  Dispense  Refill  . albuterol (PROVENTIL HFA;VENTOLIN HFA) 108 (90 BASE) MCG/ACT inhaler   Inhalation   Inhale 2 puffs into the lungs every 6 (six) hours as needed. For wheezing         . atorvastatin (LIPITOR) 20 MG tablet   Oral   Take 20 mg by mouth daily.         . famotidine (PEPCID) 20 MG tablet   Oral   Take 20 mg by mouth daily.         Marland Kitchen  furosemide (LASIX) 20 MG tablet   Oral   Take 20 mg by mouth daily.         . hydrochlorothiazide (HYDRODIURIL) 25 MG tablet   Oral   Take 25 mg by mouth daily.         . insulin detemir (LEVEMIR) 100 UNIT/ML injection   Subcutaneous   Inject 60-65 Units into the skin 2 (two) times daily. 65 units in the morning and 60 units at night.         Marland Kitchen lisinopril (PRINIVIL,ZESTRIL) 10 MG tablet   Oral   Take 10 mg by mouth daily.         Marland Kitchen loratadine (CLARITIN) 10 MG tablet   Oral   Take 10 mg by mouth daily.         . pantoprazole (PROTONIX) 40 MG tablet   Oral   Take 40 mg by mouth daily.         . potassium chloride (K-DUR) 10 MEQ tablet   Oral   Take 20 mEq by mouth 2 (two) times daily.         .  pravastatin (PRAVACHOL) 40 MG tablet   Oral   Take 40 mg by mouth daily.         Marland Kitchen triamcinolone (NASACORT) 55 MCG/ACT nasal inhaler   Nasal   Place 2 sprays into the nose daily as needed. For allergies           BP 169/88  Pulse 76  Temp(Src) 98.3 F (36.8 C) (Oral)  Resp 16  SpO2 100%  Physical Exam  Nursing note and vitals reviewed. Constitutional: She is oriented to person, place, and time. She appears well-developed and well-nourished.  HENT:  Head: Normocephalic and atraumatic.  Left ear has some debri - the TM looks normal. This might be due to the the home treatment.   Eyes: EOM are normal. Pupils are equal, round, and reactive to light.  Neck: Neck supple. No JVD present.  Cardiovascular: Normal rate, regular rhythm and normal heart sounds.   No murmur heard. Pulmonary/Chest: Effort normal. No respiratory distress.  Abdominal: Soft. She exhibits no distension. There is no tenderness. There is no rebound and no guarding.  Musculoskeletal:  RLE is grossly swollen compared to the contralateral side with tenderness to palpation.  Neurological: She is alert and oriented to person, place, and time.  Skin: Skin is warm and dry.    ED Course  Procedures (including critical care time)  Labs Reviewed  URINALYSIS, ROUTINE W REFLEX MICROSCOPIC - Abnormal; Notable for the following:    APPearance CLOUDY (*)    Leukocytes, UA SMALL (*)    All other components within normal limits  CBC WITH DIFFERENTIAL - Abnormal; Notable for the following:    MCH 25.7 (*)    All other components within normal limits  COMPREHENSIVE METABOLIC PANEL - Abnormal; Notable for the following:    Chloride 95 (*)    Glucose, Bld 171 (*)    Total Protein 8.7 (*)    All other components within normal limits  LIPASE, BLOOD - Abnormal; Notable for the following:    Lipase 7 (*)    All other components within normal limits  URINE MICROSCOPIC-ADD ON - Abnormal; Notable for the following:     Squamous Epithelial / LPF FEW (*)    Bacteria, UA FEW (*)    All other components within normal limits  URINE CULTURE   No results found.   1. Obesity, unspecified  MDM   Date: 10/08/2012  Rate: 70  Rhythm: normal sinus rhythm  QRS Axis: normal  Intervals: normal  ST/T Wave abnormalities: normal  Conduction Disutrbances: none  Narrative Interpretation: unremarkable  Pt comes in with multiple complains - all unrellated.  RLE - visible swollen, has pain - will get [place]. Wells score is 2 for swelling and pain. Ear - Seems possibly otitis externa. No mastoid tenderness, no evidence of MOE. Will give topical ear drops. GU complains - clinically consistent with possible UTI. UA however is equivocal. Will give antibiotics, and inform patient, that if not better, she will have to see her pcp for optimal care and possible urodynamic workup.   Derwood Kaplan, MD 10/08/12 1620

## 2012-10-08 NOTE — ED Notes (Addendum)
Pt has hx of chf, pt reports right leg pain and swelling x2 weeks pain 10/10, reports ear pain x2 days. Reports "pressure" when urinating and frequency. Pt reports feeling weak, unable to stand for longer than a few minutes due to leg pain. Pt has not taken any of home meds today except insulin  Also reports some SOB occasionally, had to use breathing treatment yesterday.

## 2012-10-11 LAB — URINE CULTURE: Colony Count: 75000

## 2012-11-09 ENCOUNTER — Emergency Department (HOSPITAL_COMMUNITY): Payer: Medicare Other

## 2012-11-09 ENCOUNTER — Emergency Department (HOSPITAL_COMMUNITY)
Admission: EM | Admit: 2012-11-09 | Discharge: 2012-11-09 | Disposition: A | Discharge status: Home / Self Care | Payer: Medicare Other | Attending: Emergency Medicine | Admitting: Emergency Medicine

## 2012-11-09 ENCOUNTER — Encounter (HOSPITAL_COMMUNITY): Payer: Self-pay | Admitting: Emergency Medicine

## 2012-11-09 DIAGNOSIS — Z79899 Other long term (current) drug therapy: Secondary | ICD-10-CM | POA: Insufficient documentation

## 2012-11-09 DIAGNOSIS — J209 Acute bronchitis, unspecified: Secondary | ICD-10-CM | POA: Insufficient documentation

## 2012-11-09 DIAGNOSIS — J45909 Unspecified asthma, uncomplicated: Secondary | ICD-10-CM | POA: Insufficient documentation

## 2012-11-09 DIAGNOSIS — I509 Heart failure, unspecified: Secondary | ICD-10-CM | POA: Insufficient documentation

## 2012-11-09 DIAGNOSIS — J3489 Other specified disorders of nose and nasal sinuses: Secondary | ICD-10-CM | POA: Insufficient documentation

## 2012-11-09 DIAGNOSIS — J40 Bronchitis, not specified as acute or chronic: Secondary | ICD-10-CM

## 2012-11-09 DIAGNOSIS — R0989 Other specified symptoms and signs involving the circulatory and respiratory systems: Secondary | ICD-10-CM | POA: Insufficient documentation

## 2012-11-09 DIAGNOSIS — I10 Essential (primary) hypertension: Secondary | ICD-10-CM | POA: Insufficient documentation

## 2012-11-09 DIAGNOSIS — R1310 Dysphagia, unspecified: Secondary | ICD-10-CM | POA: Insufficient documentation

## 2012-11-09 DIAGNOSIS — Z794 Long term (current) use of insulin: Secondary | ICD-10-CM | POA: Insufficient documentation

## 2012-11-09 DIAGNOSIS — J029 Acute pharyngitis, unspecified: Secondary | ICD-10-CM | POA: Insufficient documentation

## 2012-11-09 DIAGNOSIS — I2510 Atherosclerotic heart disease of native coronary artery without angina pectoris: Secondary | ICD-10-CM | POA: Insufficient documentation

## 2012-11-09 DIAGNOSIS — E119 Type 2 diabetes mellitus without complications: Secondary | ICD-10-CM | POA: Insufficient documentation

## 2012-11-09 DIAGNOSIS — R0602 Shortness of breath: Secondary | ICD-10-CM | POA: Insufficient documentation

## 2012-11-09 DIAGNOSIS — R0609 Other forms of dyspnea: Secondary | ICD-10-CM | POA: Insufficient documentation

## 2012-11-09 DIAGNOSIS — E669 Obesity, unspecified: Secondary | ICD-10-CM | POA: Insufficient documentation

## 2012-11-09 DIAGNOSIS — Z8739 Personal history of other diseases of the musculoskeletal system and connective tissue: Secondary | ICD-10-CM | POA: Insufficient documentation

## 2012-11-09 LAB — CBC WITH DIFFERENTIAL/PLATELET
Basophils Absolute: 0 10*3/uL (ref 0.0–0.1)
Basophils Relative: 0 % (ref 0–1)
Eosinophils Absolute: 0.2 10*3/uL (ref 0.0–0.7)
Hemoglobin: 12 g/dL (ref 12.0–15.0)
MCHC: 33 g/dL (ref 30.0–36.0)
Neutro Abs: 4.6 10*3/uL (ref 1.7–7.7)
Neutrophils Relative %: 58 % (ref 43–77)
Platelets: 233 10*3/uL (ref 150–400)
RDW: 15.1 % (ref 11.5–15.5)

## 2012-11-09 LAB — BASIC METABOLIC PANEL
Chloride: 101 mEq/L (ref 96–112)
GFR calc Af Amer: >90 mL/min (ref >90)
GFR calc non Af Amer: >90 mL/min (ref >90)
Potassium: 3.7 mEq/L (ref 3.5–5.1)

## 2012-11-09 MED ORDER — AZITHROMYCIN 250 MG PO TABS: ORAL_TABLET | ORAL | Status: DC

## 2012-11-09 MED ORDER — LISINOPRIL 10 MG PO TABS: 10.0000 mg | ORAL_TABLET | Freq: Every day | ORAL | Status: DC

## 2012-11-09 MED ORDER — DIPHENHYDRAMINE HCL 50 MG/ML IJ SOLN
25.0000 mg | Freq: Once | INTRAMUSCULAR | Status: AC
  Administered 2012-11-09: 25 mg via INTRAVENOUS
  Filled 2012-11-09: qty 1

## 2012-11-09 MED ORDER — ONDANSETRON HCL 4 MG/2ML IJ SOLN
4.0000 mg | Freq: Once | INTRAMUSCULAR | Status: AC
  Administered 2012-11-09: 4 mg via INTRAVENOUS
  Filled 2012-11-09: qty 2

## 2012-11-09 MED ORDER — ALBUTEROL SULFATE HFA 108 (90 BASE) MCG/ACT IN AERS: 2.0000 | INHALATION_SPRAY | RESPIRATORY_TRACT | Status: DC | PRN

## 2012-11-09 MED ORDER — ALBUTEROL SULFATE (5 MG/ML) 0.5% IN NEBU
5.0000 mg | INHALATION_SOLUTION | Freq: Once | RESPIRATORY_TRACT | Status: AC
  Administered 2012-11-09: 5 mg via RESPIRATORY_TRACT
  Filled 2012-11-09: qty 1

## 2012-11-09 NOTE — ED Notes (Signed)
Pt ambulatory to room and speaking in full sentences at this time.

## 2012-11-09 NOTE — ED Notes (Signed)
MD at bedside. 

## 2012-11-09 NOTE — ED Provider Notes (Signed)
History     CSN: [id number]  Arrival date & time 11/09/12  1158   First MD Initiated Contact with Patient 11/09/12 1313      Chief Complaint  Patient presents with  . Oral Swelling    (Consider location/radiation/quality/duration/timing/severity/associated sxs/prior treatment) HPI Comments: Patient comes to the ER for evaluation of facial and throat swelling as well as difficulty breathing. Patient reports that she woke up this morning feeling swollen. She says she has a history of sinus problems as well as asthma. She does feel like she is more congested in her sinuses than usual. She is not having trouble swallowing. Patient has not had a fever cough. She's not taking any medications.   Past Medical History  Diagnosis Date  . Hypertension   . Diabetes mellitus   . Coronary artery disease   . Asthma   . CHF (congestive heart failure)   . Arthritis   . Obesity     Past Surgical History  Procedure Laterality Date  . Eye surgery    . Abdominal hysterectomy      Family History  Problem Relation Age of Onset  . Stroke Mother     History  Substance Use Topics  . Smoking status: Never Smoker   . Smokeless tobacco: Not on file  . Alcohol Use: No    OB History   Grav Para Term Preterm Abortions TAB SAB Ect Mult Living                  Review of Systems  Constitutional: Negative for fever.  HENT: Positive for congestion, sore throat and trouble swallowing.   Respiratory: Positive for shortness of breath.   All other systems reviewed and are negative.    Allergies  Penicillins and Tetracycline  Home Medications   Current Outpatient Rx  Name  Route  Sig  Dispense  Refill  . albuterol (PROVENTIL HFA;VENTOLIN HFA) 108 (90 BASE) MCG/ACT inhaler   Inhalation   Inhale 2 puffs into the lungs every 6 (six) hours as needed. For wheezing         . atorvastatin (LIPITOR) 20 MG tablet   Oral   Take 20 mg by mouth daily.         . cephALEXin (KEFLEX) 500 MG  capsule   Oral   Take 1 capsule (500 mg total) by mouth 2 (two) times daily.   14 capsule   0   . ciprofloxacin-hydrocortisone (CIPRO HC) otic suspension   Left Ear   Place 3 drops into the left ear 2 (two) times daily.   10 mL   0     Generic OK   . famotidine (PEPCID) 20 MG tablet   Oral   Take 20 mg by mouth daily.         . furosemide (LASIX) 20 MG tablet   Oral   Take 20 mg by mouth daily.         . hydrochlorothiazide (HYDRODIURIL) 25 MG tablet   Oral   Take 25 mg by mouth daily.         Marland Kitchen HYDROcodone-acetaminophen (NORCO/VICODIN) 5-325 MG per tablet   Oral   Take 2 tablets by mouth every 4 (four) hours as needed for pain.   6 tablet   0   . ibuprofen (ADVIL,MOTRIN) 400 MG tablet   Oral   Take 1 tablet (400 mg total) by mouth every 6 (six) hours as needed for pain.   30 tablet   0   .  insulin detemir (LEVEMIR) 100 UNIT/ML injection   Subcutaneous   Inject 60-65 Units into the skin 2 (two) times daily. 65 units in the morning and 60 units at night.         Marland Kitchen lisinopril (PRINIVIL,ZESTRIL) 10 MG tablet   Oral   Take 10 mg by mouth daily.         Marland Kitchen loratadine (CLARITIN) 10 MG tablet   Oral   Take 10 mg by mouth daily.         . pantoprazole (PROTONIX) 40 MG tablet   Oral   Take 40 mg by mouth daily.         . potassium chloride (K-DUR) 10 MEQ tablet   Oral   Take 20 mEq by mouth 2 (two) times daily.         . pravastatin (PRAVACHOL) 40 MG tablet   Oral   Take 40 mg by mouth daily.         Marland Kitchen triamcinolone (NASACORT) 55 MCG/ACT nasal inhaler   Nasal   Place 2 sprays into the nose daily as needed. For allergies           BP 187/102  Pulse 83  Temp(Src) 98.2 F (36.8 C) (Oral)  Resp 22  SpO2 99%  Physical Exam  Constitutional: She is oriented to person, place, and time. She appears well-developed and well-nourished. No distress.  HENT:  Head: Normocephalic and atraumatic.  Right Ear: Hearing normal.  Nose: Nose normal.   Mouth/Throat: Oropharynx is clear and moist and mucous membranes are normal.  Eyes: Conjunctivae and EOM are normal. Pupils are equal, round, and reactive to light.  Neck: Normal range of motion. Neck supple.  Cardiovascular: Normal rate, regular rhythm, S1 normal and S2 normal.  Exam reveals no gallop and no friction rub.   No murmur heard. Pulmonary/Chest: Effort normal. No respiratory distress. She has wheezes in the right middle field, the right lower field, the left middle field and the left lower field. She exhibits no tenderness.  Abdominal: Soft. Normal appearance and bowel sounds are normal. There is no hepatosplenomegaly. There is no tenderness. There is no rebound, no guarding, no tenderness at McBurney's point and negative Murphy's sign. No hernia.  Musculoskeletal: Normal range of motion.  Neurological: She is alert and oriented to person, place, and time. She has normal strength. No cranial nerve deficit or sensory deficit. Coordination normal. GCS eye subscore is 4. GCS verbal subscore is 5. GCS motor subscore is 6.  Skin: Skin is warm, dry and intact. No rash noted. No cyanosis.  Psychiatric: She has a normal mood and affect. Her speech is normal and behavior is normal. Thought content normal.    ED Course  Procedures (including critical care time)  Labs Reviewed  CBC WITH DIFFERENTIAL - Abnormal; Notable for the following:    MCV 77.9 (*)    MCH 25.7 (*)    All other components within normal limits  BASIC METABOLIC PANEL - Abnormal; Notable for the following:    Glucose, Bld 104 (*)    All other components within normal limits   Dg Chest 2 View  11/09/2012  *RADIOLOGY REPORT*  Clinical Data: Shortness of breath, swelling  CHEST - 2 VIEW  Comparison: 10/08/2012  Findings: Borderline cardiac enlargement with vascular prominence. No definite CHF, pneumonia, effusion, collapse, consolidation, or pneumothorax.  Trachea is midline.  Degenerative changes of the spine.   IMPRESSION: Stable exam.  No superimposed acute process   Original Report Authenticated  By: Osvaldo Shipper, M.D.      Diagnosis: Asthma exacerbation    MDM  Patient presents to the ER complaining of oral swelling as well as shortness of breath. There is no rash noted. Patient says that her lips, tongue and throat are swollen, but objectively I do not see any signs of angioedema. Patient did have wheezing bilaterally and has a history of asthma. After nebulizer treatment, breathing has improved. No further wheezing. Patient does have a history of diabetes, no Solu-Medrol given because this is not clearly an allergic reaction. Blood work unremarkable. Chest x-ray clear, no sign of pneumonia.  Incidentally, patient does take lisinopril for hypertension. Patient reports, however, she has been out of this medication for a long time and has not taken it. This is therefore not related to ACE inhibitor's. Patient's blood pressure was slightly elevated. Will refill her lisinopril in addition to treatment for acute bronchitis and asthma exacerbation.       Gilda Crease, MD 11/09/12 [phone]

## 2012-11-09 NOTE — ED Notes (Addendum)
Pt reports yesterday not feeling well. Vomited x1 overnight. This AM states woke up with swollen lips, neck and throat pain with swallowing. Pt airway remains intact. No respiratory distress noted.

## 2012-11-09 NOTE — ED Notes (Signed)
Pt transported to radiology.

## 2012-11-15 ENCOUNTER — Encounter (HOSPITAL_COMMUNITY): Payer: Self-pay | Admitting: Emergency Medicine

## 2012-11-15 ENCOUNTER — Emergency Department (HOSPITAL_COMMUNITY): Payer: Medicare Other

## 2012-11-15 ENCOUNTER — Emergency Department (HOSPITAL_COMMUNITY)
Admission: EM | Admit: 2012-11-15 | Discharge: 2012-11-16 | Disposition: A | Discharge status: Home / Self Care | Payer: Medicare Other | Attending: Emergency Medicine | Admitting: Emergency Medicine

## 2012-11-15 DIAGNOSIS — I10 Essential (primary) hypertension: Secondary | ICD-10-CM | POA: Insufficient documentation

## 2012-11-15 DIAGNOSIS — I2510 Atherosclerotic heart disease of native coronary artery without angina pectoris: Secondary | ICD-10-CM | POA: Insufficient documentation

## 2012-11-15 DIAGNOSIS — Z8739 Personal history of other diseases of the musculoskeletal system and connective tissue: Secondary | ICD-10-CM | POA: Insufficient documentation

## 2012-11-15 DIAGNOSIS — J45901 Unspecified asthma with (acute) exacerbation: Secondary | ICD-10-CM | POA: Insufficient documentation

## 2012-11-15 DIAGNOSIS — I509 Heart failure, unspecified: Secondary | ICD-10-CM | POA: Insufficient documentation

## 2012-11-15 DIAGNOSIS — E119 Type 2 diabetes mellitus without complications: Secondary | ICD-10-CM | POA: Insufficient documentation

## 2012-11-15 DIAGNOSIS — Z79899 Other long term (current) drug therapy: Secondary | ICD-10-CM | POA: Insufficient documentation

## 2012-11-15 DIAGNOSIS — E669 Obesity, unspecified: Secondary | ICD-10-CM | POA: Insufficient documentation

## 2012-11-15 MED ORDER — ALBUTEROL SULFATE (5 MG/ML) 0.5% IN NEBU
INHALATION_SOLUTION | RESPIRATORY_TRACT | Status: AC
  Administered 2012-11-15: 2.5 mg via RESPIRATORY_TRACT
  Filled 2012-11-15: qty 0.5

## 2012-11-15 MED ORDER — IPRATROPIUM BROMIDE 0.02 % IN SOLN
0.5000 mg | Freq: Once | RESPIRATORY_TRACT | Status: AC
  Administered 2012-11-15: 0.5 mg via RESPIRATORY_TRACT

## 2012-11-15 MED ORDER — IPRATROPIUM BROMIDE 0.02 % IN SOLN
RESPIRATORY_TRACT | Status: AC
  Filled 2012-11-15: qty 2.5

## 2012-11-15 MED ORDER — ALBUTEROL SULFATE (5 MG/ML) 0.5% IN NEBU: 2.5000 mg | INHALATION_SOLUTION | Freq: Once | RESPIRATORY_TRACT | Status: AC

## 2012-11-15 NOTE — ED Notes (Signed)
Patient complaining of shortness of breath and wheezing that started a couple of weeks ago.  Patient was recently seen here (last Tuesday); given a breathing treatment and antibiotic.  Patient reports that she has finished antibiotic treatment.  History of asthma and bronchitis.

## 2012-11-16 LAB — PRO B NATRIURETIC PEPTIDE: Pro B Natriuretic peptide (BNP): 213.3 pg/mL — ABNORMAL HIGH (ref 0–125)

## 2012-11-16 LAB — BASIC METABOLIC PANEL
CO2: 25 mEq/L (ref 19–32)
Chloride: 101 mEq/L (ref 96–112)
Creatinine, Ser: 0.55 mg/dL (ref 0.50–1.10)

## 2012-11-16 LAB — CBC
HCT: 33.1 % — ABNORMAL LOW (ref 36.0–46.0)
Hemoglobin: 10.6 g/dL — ABNORMAL LOW (ref 12.0–15.0)
MCV: 79 fL (ref 78.0–100.0)
Platelets: 239 10*3/uL (ref 150–400)
RBC: 4.19 MIL/uL (ref 3.87–5.11)
WBC: 7.9 10*3/uL (ref 4.0–10.5)

## 2012-11-16 LAB — POCT I-STAT TROPONIN I: Troponin i, poc: 0 ng/mL (ref 0.00–0.08)

## 2012-11-16 MED ORDER — ALBUTEROL SULFATE (5 MG/ML) 0.5% IN NEBU
5.0000 mg | INHALATION_SOLUTION | Freq: Once | RESPIRATORY_TRACT | Status: AC
  Administered 2012-11-16: 5 mg via RESPIRATORY_TRACT
  Filled 2012-11-16: qty 1

## 2012-11-16 MED ORDER — IPRATROPIUM BROMIDE 0.02 % IN SOLN
0.5000 mg | Freq: Once | RESPIRATORY_TRACT | Status: AC
  Administered 2012-11-16: 0.5 mg via RESPIRATORY_TRACT
  Filled 2012-11-16: qty 2.5

## 2012-11-16 MED ORDER — PREDNISONE 20 MG PO TABS: 20.0000 mg | ORAL_TABLET | Freq: Two times a day (BID) | ORAL | Status: DC

## 2012-11-16 MED ORDER — PREDNISONE 20 MG PO TABS
60.0000 mg | ORAL_TABLET | Freq: Once | ORAL | Status: AC
  Administered 2012-11-16: 60 mg via ORAL
  Filled 2012-11-16: qty 3
  Filled 2012-11-16: qty 1

## 2012-11-16 MED ORDER — HYDROCHLOROTHIAZIDE 25 MG PO TABS: 25.0000 mg | ORAL_TABLET | Freq: Every day | ORAL | Status: DC

## 2012-11-16 NOTE — ED Provider Notes (Signed)
History     CSN: [id number]  Arrival date & time 11/15/12  2301   First MD Initiated Contact with Patient 11/15/12 2349      Chief Complaint  Patient presents with  . Shortness of Breath    (Consider location/radiation/quality/duration/timing/severity/associated sxs/prior treatment) HPI History provided by pt and prior chart.  Pt seen in ED on 3/25 for evaluation of SOB.  Was diagnosed w/ acute bronchitis and asthma exacerbation.  Wheezing improved w/ nebulizer treatment and pt d/c'd home w/ abx.  Returns today because she developed SOB and wheezing again 2 days ago.  Aggravated by exertion and laying flat, no relief w/ albuterol inhaler and associated w/ tightness in center of chest w/ inspiration.  Denies fever, cough and worse than baseline LE edema.  Sx typical of asthma exacerbations.  Past Medical History  Diagnosis Date  . Hypertension   . Diabetes mellitus   . Coronary artery disease   . Asthma   . CHF (congestive heart failure)   . Arthritis   . Obesity     Past Surgical History  Procedure Laterality Date  . Eye surgery    . Abdominal hysterectomy      Family History  Problem Relation Age of Onset  . Stroke Mother     History  Substance Use Topics  . Smoking status: Never Smoker   . Smokeless tobacco: Not on file  . Alcohol Use: No    OB History   Grav Para Term Preterm Abortions TAB SAB Ect Mult Living                  Review of Systems  All other systems reviewed and are negative.    Allergies  Penicillins and Tetracycline  Home Medications   Current Outpatient Rx  Name  Route  Sig  Dispense  Refill  . albuterol (PROVENTIL HFA;VENTOLIN HFA) 108 (90 BASE) MCG/ACT inhaler   Inhalation   Inhale 2 puffs into the lungs every 6 (six) hours as needed. For wheezing         . atorvastatin (LIPITOR) 20 MG tablet   Oral   Take 20 mg by mouth daily.         Marland Kitchen esomeprazole (NEXIUM) 40 MG capsule   Oral   Take 40 mg by mouth daily before  breakfast.         . furosemide (LASIX) 20 MG tablet   Oral   Take 20 mg by mouth daily.         . hydrochlorothiazide (HYDRODIURIL) 25 MG tablet   Oral   Take 25 mg by mouth daily.         . insulin detemir (LEVEMIR) 100 UNIT/ML injection   Subcutaneous   Inject 60-65 Units into the skin 2 (two) times daily. 65 units in the morning and 60 units at night.         Marland Kitchen lisinopril (PRINIVIL,ZESTRIL) 10 MG tablet   Oral   Take 10 mg by mouth daily.         Marland Kitchen loratadine (CLARITIN) 10 MG tablet   Oral   Take 10 mg by mouth daily.         . potassium chloride (K-DUR) 10 MEQ tablet   Oral   Take 10 mEq by mouth 2 (two) times daily.          Marland Kitchen triamcinolone (NASACORT) 55 MCG/ACT nasal inhaler   Nasal   Place 2 sprays into the nose daily as needed. For allergies         .  azithromycin (ZITHROMAX Z-PAK) 250 MG tablet      2 po day one, then 1 daily x 4 days   5 tablet   0     BP 174/86  Pulse 77  Temp(Src) 98.8 F (37.1 C) (Oral)  Resp 17  SpO2 100%  Physical Exam  Nursing note and vitals reviewed. Constitutional: She is oriented to person, place, and time. She appears well-developed and well-nourished. No distress.  Sitting up straight in bed  HENT:  Head: Normocephalic and atraumatic.  Eyes:  Normal appearance  Neck: Normal range of motion.  Cardiovascular: Normal rate and regular rhythm.   hypertensive  Pulmonary/Chest: Breath sounds normal. She has no wheezes. She has no rales.  Mild dyspnea.    Musculoskeletal: Normal range of motion.  Trace, symmetric pitting peripheral edema  Neurological: She is alert and oriented to person, place, and time.  Skin: Skin is warm and dry. No rash noted.  Psychiatric: She has a normal mood and affect. Her behavior is normal.    ED Course  Procedures (including critical care time)   Date: 11/16/2012  Rate: 76  Rhythm: normal sinus rhythm  QRS Axis: normal  Intervals: normal  ST/T Wave abnormalities:  normal  Conduction Disutrbances:none  Narrative Interpretation:   Old EKG Reviewed: none available   Labs Reviewed  CBC - Abnormal; Notable for the following:    Hemoglobin 10.6 (*)    HCT 33.1 (*)    MCH 25.3 (*)    All other components within normal limits  BASIC METABOLIC PANEL - Abnormal; Notable for the following:    Glucose, Bld 206 (*)    All other components within normal limits  PRO B NATRIURETIC PEPTIDE - Abnormal; Notable for the following:    Pro B Natriuretic peptide (BNP) 213.3 (*)    All other components within normal limits   Dg Chest 2 View  11/15/2012  *RADIOLOGY REPORT*  Clinical Data: Shortness of breath.  History of asthma and bronchitis.  CHEST - 2 VIEW  Comparison: 11/09/2012  Findings: Borderline heart size and pulmonary vascularity, similar to previous study.  No evidence of progression.  No focal consolidation or airspace disease in the lungs.  No blunting of costophrenic angles.  No pneumothorax.  Mild hyperinflation. Mediastinal contours appear intact.  Degenerative changes in the thoracic spine.  IMPRESSION: No evidence of active pulmonary disease.   Original Report Authenticated By: Burman Nieves, M.D.      1. Asthma exacerbation   2. Hypertension       MDM  52yo F diagnosed w/ asthma exacerbation and acute bronchitis in ED on 3/25 and was treated w/ albuterol neb and abx.  Was not prescribed steroids.  Returns today because symptoms returned over weekend.   They are typical of her asthma.  Received a neb treatment in triage and reports improvement in SOB and wheezing, but continues to have tightness in center of chest w/ inspiration.  On my exam, afebrile and VS w/in nml range, mild respiratory distress, nml breath sounds, trace, symmetric peripheral edema.  CXR and labs unremarkable. Pt received a second neb as well as 60mg  po prednisone, and sx improved further.  Ambulated w/out being symptomatic or dropping O2 sat.  D/c'd home w/ 5 day course of  prednisone.  Return precautions discussed.         Otilio Miu, PA-C 11/16/12 [phone]

## 2012-11-16 NOTE — ED Provider Notes (Signed)
Medical screening examination/treatment/procedure(s) were performed by non-physician practitioner and as supervising physician I was immediately available for consultation/collaboration.    Shelda Jakes, MD 11/16/12 [phone]

## 2013-10-28 ENCOUNTER — Encounter (HOSPITAL_COMMUNITY): Payer: Self-pay | Admitting: Emergency Medicine

## 2013-10-28 ENCOUNTER — Emergency Department (INDEPENDENT_AMBULATORY_CARE_PROVIDER_SITE_OTHER)
Admission: EM | Admit: 2013-10-28 | Discharge: 2013-10-28 | Disposition: A | Discharge status: Home / Self Care | Payer: Medicare HMO | Source: Home / Self Care

## 2013-10-28 DIAGNOSIS — M25561 Pain in right knee: Secondary | ICD-10-CM

## 2013-10-28 DIAGNOSIS — E119 Type 2 diabetes mellitus without complications: Secondary | ICD-10-CM

## 2013-10-28 DIAGNOSIS — M25569 Pain in unspecified knee: Secondary | ICD-10-CM

## 2013-10-28 DIAGNOSIS — K2990 Gastroduodenitis, unspecified, without bleeding: Secondary | ICD-10-CM

## 2013-10-28 DIAGNOSIS — N390 Urinary tract infection, site not specified: Secondary | ICD-10-CM

## 2013-10-28 DIAGNOSIS — R1110 Vomiting, unspecified: Secondary | ICD-10-CM

## 2013-10-28 DIAGNOSIS — K2970 Gastritis, unspecified, without bleeding: Secondary | ICD-10-CM

## 2013-10-28 LAB — POCT URINALYSIS DIP (DEVICE)
Bilirubin Urine: NEGATIVE
Glucose, UA: NEGATIVE mg/dL
Hgb urine dipstick: NEGATIVE
KETONES UR: NEGATIVE mg/dL
Nitrite: NEGATIVE
PH: 6 (ref 5.0–8.0)
PROTEIN: NEGATIVE mg/dL
SPECIFIC GRAVITY, URINE: 1.02 (ref 1.005–1.030)
UROBILINOGEN UA: 0.2 mg/dL (ref 0.0–1.0)

## 2013-10-28 LAB — POCT I-STAT, CHEM 8
BUN: 14 mg/dL (ref 6–23)
CALCIUM ION: 1.19 mmol/L (ref 1.12–1.23)
CREATININE: 0.8 mg/dL (ref 0.50–1.10)
Chloride: 99 mEq/L (ref 96–112)
GLUCOSE: 106 mg/dL — AB (ref 70–99)
HCT: 41 % (ref 36.0–46.0)
HEMOGLOBIN: 13.9 g/dL (ref 12.0–15.0)
Potassium: 3.7 mEq/L (ref 3.7–5.3)
SODIUM: 139 meq/L (ref 137–147)
TCO2: 33 mmol/L (ref 0–100)

## 2013-10-28 MED ORDER — ONDANSETRON HCL 4 MG/2ML IJ SOLN
4.0000 mg | Freq: Once | INTRAMUSCULAR | Status: AC
  Administered 2013-10-28: 4 mg via INTRAMUSCULAR

## 2013-10-28 MED ORDER — ONDANSETRON HCL 4 MG/2ML IJ SOLN
INTRAMUSCULAR | Status: AC
  Filled 2013-10-28: qty 2

## 2013-10-28 MED ORDER — TRAMADOL HCL 50 MG PO TABS: 50.0000 mg | ORAL_TABLET | Freq: Four times a day (QID) | ORAL | Status: DC | PRN

## 2013-10-28 MED ORDER — CEPHALEXIN 250 MG PO CAPS: 250.0000 mg | ORAL_CAPSULE | Freq: Four times a day (QID) | ORAL | Status: DC

## 2013-10-28 MED ORDER — ONDANSETRON HCL 4 MG PO TABS: 4.0000 mg | ORAL_TABLET | Freq: Four times a day (QID) | ORAL | Status: DC

## 2013-10-28 NOTE — ED Provider Notes (Signed)
CSN: [id number]     Arrival date & time 10/28/13  1616 History   First MD Initiated Contact with Patient 10/28/13 1730     Chief Complaint  Patient presents with  . Headache  . Abdominal Pain  . Emesis   (Consider location/radiation/quality/duration/timing/severity/associated sxs/prior Treatment) HPI Comments: 53 year old morbidly of these female with a history of type 2 diabetes mellitus, morbid obesity, edema, hypertension, dyslipidemia, metabolic syndrome, asthma and arthritis presents with a complaint of feeling generally sick over the past week. She is complaining of headache and mild dizziness. In the past today she said vomiting. She vomited 3 times today has not vomited in the past 7 hours. She has not tried to drink anything but water today and she vomited it. That was around noon. Denies abdominal pain.  Also complaining of mild dysuria and urinary frequency. She states her blood sugar checked at home have been normal between 100 and 140. No diarrhea.   Past Medical History  Diagnosis Date  . Hypertension   . Diabetes mellitus   . Coronary artery disease   . Asthma   . CHF (congestive heart failure)   . Arthritis   . Obesity    Past Surgical History  Procedure Laterality Date  . Eye surgery    . Abdominal hysterectomy     Family History  Problem Relation Age of Onset  . Stroke Mother    History  Substance Use Topics  . Smoking status: Never Smoker   . Smokeless tobacco: Not on file  . Alcohol Use: No   OB History   Grav Para Term Preterm Abortions TAB SAB Ect Mult Living                 Review of Systems  Constitutional: Positive for activity change, appetite change and fatigue. Negative for fever.  HENT: Negative.   Respiratory: Negative for chest tightness, shortness of breath and wheezing.   Cardiovascular: Negative.   Gastrointestinal: Positive for nausea and vomiting. Negative for abdominal pain, diarrhea, constipation and blood in stool.   Genitourinary: Positive for dysuria and frequency. Negative for urgency, flank pain, vaginal discharge and pelvic pain.  Musculoskeletal: Positive for arthralgias.  Skin: Negative.   Neurological: Positive for light-headedness. Negative for syncope and speech difficulty.    Allergies  Penicillins and Tetracycline  Home Medications   Current Outpatient Rx  Name  Route  Sig  Dispense  Refill  . atorvastatin (LIPITOR) 20 MG tablet   Oral   Take 20 mg by mouth daily.         . Cholecalciferol (VITAMIN D PO)   Oral   Take by mouth.         Marland Kitchen lisinopril-hydrochlorothiazide (PRINZIDE,ZESTORETIC) 20-25 MG per tablet   Oral   Take 1 tablet by mouth daily.         . nebivolol (BYSTOLIC) 5 MG tablet   Oral   Take 5 mg by mouth daily.         Marland Kitchen omeprazole (PRILOSEC) 20 MG capsule   Oral   Take 20 mg by mouth daily.         Marland Kitchen albuterol (PROVENTIL HFA;VENTOLIN HFA) 108 (90 BASE) MCG/ACT inhaler   Inhalation   Inhale 2 puffs into the lungs every 6 (six) hours as needed. For wheezing         . azithromycin (ZITHROMAX Z-PAK) 250 MG tablet      2 po day one, then 1 daily x 4 days   5  tablet   0   . cephALEXin (KEFLEX) 250 MG capsule   Oral   Take 1 capsule (250 mg total) by mouth 4 (four) times daily.   28 capsule   0   . esomeprazole (NEXIUM) 40 MG capsule   Oral   Take 40 mg by mouth daily before breakfast.         . furosemide (LASIX) 20 MG tablet   Oral   Take 20 mg by mouth daily.         . hydrochlorothiazide (HYDRODIURIL) 25 MG tablet   Oral   Take 25 mg by mouth daily.         . hydrochlorothiazide (HYDRODIURIL) 25 MG tablet   Oral   Take 1 tablet (25 mg total) by mouth daily.   30 tablet   0   . insulin detemir (LEVEMIR) 100 UNIT/ML injection   Subcutaneous   Inject 60-65 Units into the skin 2 (two) times daily. 65 units in the morning and 60 units at night.         Marland Kitchen lisinopril (PRINIVIL,ZESTRIL) 10 MG tablet   Oral   Take 10 mg  by mouth daily.         Marland Kitchen loratadine (CLARITIN) 10 MG tablet   Oral   Take 10 mg by mouth daily.         . ondansetron (ZOFRAN) 4 MG tablet   Oral   Take 1 tablet (4 mg total) by mouth every 6 (six) hours. Prn N and V   15 tablet   0   . potassium chloride (K-DUR) 10 MEQ tablet   Oral   Take 10 mEq by mouth 2 (two) times daily.          . predniSONE (DELTASONE) 20 MG tablet   Oral   Take 1 tablet (20 mg total) by mouth 2 (two) times daily.   10 tablet   0   . traMADol (ULTRAM) 50 MG tablet   Oral   Take 1 tablet (50 mg total) by mouth every 6 (six) hours as needed.   15 tablet   0   . triamcinolone (NASACORT) 55 MCG/ACT nasal inhaler   Nasal   Place 2 sprays into the nose daily as needed. For allergies          BP 171/80  Pulse 64  Temp(Src) 98.3 F (36.8 C) (Oral)  Resp 22  SpO2 100% Physical Exam  Constitutional: She is oriented to person, place, and time. She appears well-nourished. No distress.  Obese. Awake, energetic speech, ambulatory with a nl gait.   HENT:  Mouth/Throat: Oropharynx is clear and moist. No oropharyngeal exudate.  Bilat TM's nl   Eyes: Conjunctivae and EOM are normal.  Neck: Normal range of motion. Neck supple.  Cardiovascular: Normal rate and regular rhythm.   Pulmonary/Chest: Effort normal and breath sounds normal. No respiratory distress. She has no wheezes. She has no rales.  Abdominal: Soft. Bowel sounds are normal. She exhibits no distension and no mass. There is no tenderness. There is no rebound and no guarding.  Musculoskeletal:  Generalized tenderness to the right knee, no edema or discoloration or deformity. Distal N/V, M/S intact.  Neurological: She is alert and oriented to person, place, and time. No cranial nerve deficit. She exhibits normal muscle tone.  Skin: Skin is warm and dry. No rash noted. No erythema.  Psychiatric: She has a normal mood and affect.    ED Course  Procedures (including critical care  time) Labs Review Labs Reviewed  POCT URINALYSIS DIP (DEVICE) - Abnormal; Notable for the following:    Leukocytes, UA SMALL (*)    All other components within normal limits  POCT I-STAT, CHEM 8 - Abnormal; Notable for the following:    Glucose, Bld 106 (*)    All other components within normal limits  URINE CULTURE   Results for orders placed during the hospital encounter of 10/28/13  POCT URINALYSIS DIP (DEVICE)      Result Value Ref Range   Glucose, UA NEGATIVE  NEGATIVE mg/dL   Bilirubin Urine NEGATIVE  NEGATIVE   Ketones, ur NEGATIVE  NEGATIVE mg/dL   Specific Gravity, Urine 1.020  1.005 - 1.030   Hgb urine dipstick NEGATIVE  NEGATIVE   pH 6.0  5.0 - 8.0   Protein, ur NEGATIVE  NEGATIVE mg/dL   Urobilinogen, UA 0.2  0.0 - 1.0 mg/dL   Nitrite NEGATIVE  NEGATIVE   Leukocytes, UA SMALL (*) NEGATIVE  POCT I-STAT, CHEM 8      Result Value Ref Range   Sodium 139  137 - 147 mEq/L   Potassium 3.7  3.7 - 5.3 mEq/L   Chloride 99  96 - 112 mEq/L   BUN 14  6 - 23 mg/dL   Creatinine, Ser 6.26  0.50 - 1.10 mg/dL   Glucose, Bld 948 (*) 70 - 99 mg/dL   Calcium, Ion 5.46  2.70 - 1.23 mmol/L   TCO2 33  0 - 100 mmol/L   Hemoglobin 13.9  12.0 - 15.0 g/dL   HCT 35.0  09.3 - 81.8 %    Imaging Review No results found.   MDM   1. Viral gastritis   2. Vomiting   3. T2DM (type 2 diabetes mellitus)   4. UTI (lower urinary tract infection)   5. Arthralgia of right knee     I-STAT is normal. Urinalysis with small amount of leukocytes only. Since the patient is feeling sickly with minor urinary symptoms will go ahead and treat with antibiotics. She has informed that this may cause more nausea and vomiting. She is alert, does not appear toxic. Patient is stable and I believe should be out of the home and improved with the medication. She is willing to do that and agrees with the plan as we discussed. Zofran 4 mg IM here and a prescription for 4 mg by mouth daily 6-8 hours when  necessary Keflex 250 mg 4 times a day for 7 days Urine culture Tramadol 50 mg every 6 hours when necessary knee pain Followup he or Dr. Monday, 3 days. For any worsening symptoms or problems may need to go to the emergency department. If unable to hold down liquids despite taking the medications here to the emergency department. Did not take the had both eyes for the next 2 days until you are able to drink and start eating. Slowly advance diet as we discussed. Monitor blood sugars carefully. may need to reduce the amount of insulin based on decreased by mouth intake. Administered based on blood sugars.     Hayden Rasmussen, NP 10/28/13 [phone]

## 2013-10-28 NOTE — ED Notes (Signed)
Headache, nausea, vomiting, and abdominal pain.  Patient reports unable to hold down medicines, reports the last time she felt this way, she was diagnosed with a uti.  This episode started 4 days ago

## 2013-10-28 NOTE — Discharge Instructions (Signed)
Antibiotic Medication Antibiotics are among the most frequently prescribed medicines. Antibiotics cure illness by assisting our body to injure or kill the bacteria that cause infection. While antibiotics are useful to treat a wide variety of infections they are useless against viruses. Antibiotics cannot cure colds, flu, or other viral infections.  There are many types of antibiotics available. Your caregiver will decide which antibiotic will be useful for an illness. Never take or give someone else's antibiotics or left over medicine. Your caregiver may also take into account:  Allergies.  The cost of the medicine.  Dosing schedules.  Taste.  Common side effects when choosing an antibiotic for an infection. Ask your caregiver if you have questions about why a certain medicine was chosen. HOME CARE INSTRUCTIONS Read all instructions and labels on medicine bottles carefully. Some antibiotics should be taken on an empty stomach while others should be taken with food. Taking antibiotics incorrectly may reduce how well they work. Some antibiotics need to be kept in the refrigerator. Others should be kept at room temperature. Ask your caregiver or pharmacist if you do not understand how to give the medicine. Be sure to give the amount of medicine your caregiver has prescribed. Even if you feel better and your symptoms improve, bacteria may still remain alive in the body. Taking all of the medicine will prevent:  The infection from returning and becoming harder to treat.  Complications from partially treated infections. If there is any medicine left over after you have taken the medicine as your caregiver has instructed, throw the medicine away. Be sure to tell your caregiver if you:  Are allergic to any medicines.  Are pregnant or intend to become pregnant while using this medicine.  Are breastfeeding.  Are taking any other prescription, non-prescription medicine, or herbal  remedies.  Have any other medical conditions or problems you have not already discussed. If you are taking birth control pills, they may not work while you are on antibiotics. To avoid unwanted pregnancy:  Continue taking your birth control pills as usual.  Use a second form of birth control (such as condoms) while you are taking antibiotic medicine.  When you finish taking the antibiotic medicine, continue using the second form of birth control until you are finished with your current 1 month cycle of birth control pills. Try not to miss any doses of medicine. If you miss a dose, take it as soon as possible. However, if it is almost time for the next dose and the dosing schedule is:  2 doses a day, take the missed dose and the next dose 5 to 6 hours apart.  3 or more doses a day, take the missed dose and the next dose 2 to 4 hours apart, then go back to the normal schedule.  If you are unable to make up a missed dose, take the next scheduled dose on time and complete the missed dose at the end of the prescribed time for your medicine. SIDE EFFECTS TO TAKING ANTIBIOTICS Common side effects to antibiotic use include:  Soft stools or diarrhea.  Mild stomach upset.  Sun sensitivity. SEEK MEDICAL CARE IF:   If you get worse or do not improve within a few days of starting the medicine.  Vomiting develops.  Diaper rash or rash on the genitals appears.  Vaginal itching occurs.  White patches appear on the tongue or in the mouth.  Severe watery diarrhea and abdominal cramps occur.  Signs of an allergy develop (hives, unknown  itchy rash appears). STOP TAKING THE ANTIBIOTIC. SEEK IMMEDIATE MEDICAL CARE IF:   Urine turns dark or blood colored.  Skin turns yellow.  Easy bruising or bleeding occurs.  Joint pain or muscle aches occur.  Fever returns.  Severe headache occurs.  Signs of an allergy develop (trouble breathing, wheezing, swelling of the lips, face or tongue,  fainting, or blisters on the skin or in the mouth). STOP TAKING THE ANTIBIOTIC. Document Released: 04/16/2004 Document Revised: 10/27/2011 Document Reviewed: 04/26/2009 [facility] Patient Information 2014 [place], [place].  Blood Glucose Monitoring, Adult Monitoring your blood glucose (also know as blood sugar) helps you to manage your diabetes. It also helps you and your health care provider monitor your diabetes and determine how well your treatment plan is working. WHY SHOULD YOU MONITOR YOUR BLOOD GLUCOSE?  It can help you understand how food, exercise, and medicine affect your blood glucose.  It allows you to know what your blood glucose is at any given moment. You can quickly tell if you are having low blood glucose (hypoglycemia) or high blood glucose (hyperglycemia).  It can help you and your health care provider know how to adjust your medicines.  It can help you understand how to manage an illness or adjust medicine for exercise. WHEN SHOULD YOU TEST? Your health care provider will help you decide how often you should check your blood glucose. This may depend on the type of diabetes you have, your diabetes control, or the types of medicines you are taking. Be sure to write down all of your blood glucose readings so that this information can be reviewed with your health care provider. See below for examples of testing times that your health care provider may suggest. Type 1 Diabetes  Test 4 times a day if you are in good control, using an insulin pump, or perform multiple daily injections.  If your diabetes is not well-controlled or if you are sick, you may need to monitor more often.  It is a good idea to also monitor:  Before and after exercise.  Between meals and 2 hours after a meal.  Occasionally between 2:00 to 3:00 am. Type 2 Diabetes  It can vary with each person, but generally, if you are on insulin, test 4 times a day.  If you take medicines by mouth (orally), test 2  times a day.  If you are on a controlled diet, test once a day.  If your diabetes is not well controlled or if you are sick, you may need to monitor more often. HOW TO MONITOR YOUR BLOOD GLUCOSE Supplies Needed  Blood glucose meter.  Test strips for your meter. Each meter has its own strips. You must use the strips that go with your own meter.  A pricking needle (lancet).  A device that holds the lancet (lancing device).  A journal or log book to write down your results. Procedure  Wash your hands with soap and water. Alcohol is not preferred.  Prick the side of your finger (not the tip) with the lancet.  Gently milk the finger until a small drop of blood appears.  Follow the instructions that come with your meter for inserting the test strip, applying blood to the strip, and using your blood glucose meter. Other Areas to Get Blood for Testing Some meters allow you to use other areas of your body (other than your finger) to test your blood. These areas are called alternative sites. The most common alternative sites are:  The forearm.  The thigh.  The back area of the lower leg.  The palm of the hand. The blood flow in these areas is slower. Therefore, the blood glucose values you get may be delayed, and the numbers are different from what you would get from your fingers. Do not use alternative sites if you think you are having hypoglycemia. Your reading will not be accurate. Always use a finger if you are having hypoglycemia. Also, if you cannot feel your lows (hypoglycemia unawareness), always use your fingers for your blood glucose checks. ADDITIONAL TIPS FOR GLUCOSE MONITORING  Do not reuse lancets.  Always carry your supplies with you.  All blood glucose meters have a 24-hour "hotline" number to call if you have questions or need help.  Adjust (calibrate) your blood glucose meter with a control solution after finishing a few boxes of strips. BLOOD GLUCOSE RECORD  KEEPING It is a good idea to keep a daily record or log of your blood glucose readings. Most glucose meters, if not all, keep your glucose records stored in the meter. Some meters come with the ability to download your records to your home computer. Keeping a record of your blood glucose readings is especially helpful if you are wanting to look for patterns. Make notes to go along with the blood glucose readings because you might forget what happened at that exact time. Keeping good records helps you and your health care provider to work together to achieve good diabetes management.  Document Released: 08/07/2003 Document Revised: 04/06/2013 Document Reviewed: 12/27/2012 [facility] Patient Information 2014 [place], [place].  Arthralgia Your caregiver has diagnosed you as suffering from an arthralgia. Arthralgia means there is pain in a joint. This can come from many reasons including:  Bruising the joint which causes soreness (inflammation) in the joint.  Wear and tear on the joints which occur as we grow older (osteoarthritis).  Overusing the joint.  Various forms of arthritis.  Infections of the joint. Regardless of the cause of pain in your joint, most of these different pains respond to anti-inflammatory drugs and rest. The exception to this is when a joint is infected, and these cases are treated with antibiotics, if it is a bacterial infection. HOME CARE INSTRUCTIONS   Rest the injured area for as long as directed by your caregiver. Then slowly start using the joint as directed by your caregiver and as the pain allows. Crutches as directed may be useful if the ankles, knees or hips are involved. If the knee was splinted or casted, continue use and care as directed. If an stretchy or elastic wrapping bandage has been applied today, it should be removed and re-applied every 3 to 4 hours. It should not be applied tightly, but firmly enough to keep swelling down. Watch toes and feet for swelling,  bluish discoloration, coldness, numbness or excessive pain. If any of these problems (symptoms) occur, remove the ace bandage and re-apply more loosely. If these symptoms persist, contact your caregiver or return to this location.  For the first 24 hours, keep the injured extremity elevated on pillows while lying down.  Apply ice for 15-20 minutes to the sore joint every couple hours while awake for the first half day. Then 03-04 times per day for the first 48 hours. Put the ice in a plastic bag and place a towel between the bag of ice and your skin.  Wear any splinting, casting, elastic bandage applications, or slings as instructed.  Only take over-the-counter or prescription medicines for pain,  discomfort, or fever as directed by your caregiver. Do not use aspirin immediately after the injury unless instructed by your physician. Aspirin can cause increased bleeding and bruising of the tissues.  If you were given crutches, continue to use them as instructed and do not resume weight bearing on the sore joint until instructed. Persistent pain and inability to use the sore joint as directed for more than 2 to 3 days are warning signs indicating that you should see a caregiver for a follow-up visit as soon as possible. Initially, a hairline fracture (break in bone) may not be evident on X-rays. Persistent pain and swelling indicate that further evaluation, non-weight bearing or use of the joint (use of crutches or slings as instructed), or further X-rays are indicated. X-rays may sometimes not show a small fracture until a week or 10 days later. Make a follow-up appointment with your own caregiver or one to whom we have referred you. A radiologist (specialist in reading X-rays) may read your X-rays. Make sure you know how you are to obtain your X-ray results. Do not assume everything is normal if you do not hear from [place]. SEEK MEDICAL CARE IF: Bruising, swelling, or pain increases. SEEK IMMEDIATE MEDICAL  CARE IF:   Your fingers or toes are numb or blue.  The pain is not responding to medications and continues to stay the same or get worse.  The pain in your joint becomes severe.  You develop a fever over 102 F (38.9 C).  It becomes impossible to move or use the joint. MAKE SURE YOU:   Understand these instructions.  Will watch your condition.  Will get help right away if you are not doing well or get worse. Document Released: 08/04/2005 Document Revised: 10/27/2011 Document Reviewed: 03/22/2008 [facility] Patient Information 2014 [place], [place].  Gastritis, Adult Gastritis is soreness and puffiness (inflammation) of the lining of the stomach. If you do not get help, gastritis can cause bleeding and sores (ulcers) in the stomach. HOME CARE   Only take medicine as told by your doctor.  If you were given antibiotic medicines, take them as told. Finish the medicines even if you start to feel better.  Drink enough fluids to keep your pee (urine) clear or pale yellow.  Avoid foods and drinks that make your problems worse. Foods you may want to avoid include:  Caffeine or alcohol.  Chocolate.  Mint.  Garlic and onions.  Spicy foods.  Citrus fruits, including oranges, lemons, or limes.  Food containing tomatoes, including sauce, chili, salsa, and pizza.  Fried and fatty foods.  Eat small meals throughout the day instead of large meals. GET HELP RIGHT AWAY IF:   You have black or dark red poop (stools).  You throw up (vomit) blood. It may look like coffee grounds.  You cannot keep fluids down.  Your belly (abdominal) pain gets worse.  You have a fever.  You do not feel better after 1 week.  You have any other questions or concerns. MAKE SURE YOU:   Understand these instructions.  Will watch your condition.  Will get help right away if you are not doing well or get worse. Document Released: 01/21/2008 Document Revised: 10/27/2011 Document Reviewed:  09/17/2011 [facility] Patient Information 2014 [place], [place].  Nausea and Vomiting Nausea means you feel sick to your stomach. Throwing up (vomiting) is a reflex where stomach contents come out of your mouth. HOME CARE   Take medicine as told by your doctor.  Do not force  yourself to eat. However, you do need to drink fluids.  If you feel like eating, eat a normal diet as told by your doctor.  Eat rice, wheat, potatoes, bread, lean meats, yogurt, fruits, and vegetables.  Avoid high-fat foods.  Drink enough fluids to keep your pee (urine) clear or pale yellow.  Ask your doctor how to replace body fluid losses (rehydrate). Signs of body fluid loss (dehydration) include:  Feeling very thirsty.  Dry lips and mouth.  Feeling dizzy.  Dark pee.  Peeing less than normal.  Feeling confused.  Fast breathing or heart rate. GET HELP RIGHT AWAY IF:   You have blood in your throw up.  You have black or bloody poop (stool).  You have a bad headache or stiff neck.  You feel confused.  You have bad belly (abdominal) pain.  You have chest pain or trouble breathing.  You do not pee at least once every 8 hours.  You have cold, clammy skin.  You keep throwing up after 24 to 48 hours.  You have a fever. MAKE SURE YOU:   Understand these instructions.  Will watch your condition.  Will get help right away if you are not doing well or get worse. Document Released: 01/21/2008 Document Revised: 10/27/2011 Document Reviewed: 01/03/2011 [facility] Patient Information 2014 [place], [place].  Urinary Tract Infection Urinary tract infections (UTIs) can develop anywhere along your urinary tract. Your urinary tract is your body's drainage system for removing wastes and extra water. Your urinary tract includes two kidneys, two ureters, a bladder, and a urethra. Your kidneys are a pair of bean-shaped organs. Each kidney is about the size of your fist. They are located below your ribs, one  on each side of your spine. CAUSES Infections are caused by microbes, which are microscopic organisms, including fungi, viruses, and bacteria. These organisms are so small that they can only be seen through a microscope. Bacteria are the microbes that most commonly cause UTIs. SYMPTOMS  Symptoms of UTIs may vary by age and gender of the patient and by the location of the infection. Symptoms in young women typically include a frequent and intense urge to urinate and a painful, burning feeling in the bladder or urethra during urination. Older women and men are more likely to be tired, shaky, and weak and have muscle aches and abdominal pain. A fever may mean the infection is in your kidneys. Other symptoms of a kidney infection include pain in your back or sides below the ribs, nausea, and vomiting. DIAGNOSIS To diagnose a UTI, your caregiver will ask you about your symptoms. Your caregiver also will ask to provide a urine sample. The urine sample will be tested for bacteria and white blood cells. White blood cells are made by your body to help fight infection. TREATMENT  Typically, UTIs can be treated with medication. Because most UTIs are caused by a bacterial infection, they usually can be treated with the use of antibiotics. The choice of antibiotic and length of treatment depend on your symptoms and the type of bacteria causing your infection. HOME CARE INSTRUCTIONS  If you were prescribed antibiotics, take them exactly as your caregiver instructs you. Finish the medication even if you feel better after you have only taken some of the medication.  Drink enough water and fluids to keep your urine clear or pale yellow.  Avoid caffeine, tea, and carbonated beverages. They tend to irritate your bladder.  Empty your bladder often. Avoid holding urine for long periods of  time.  Empty your bladder before and after sexual intercourse.  After a bowel movement, women should cleanse from front to back.  Use each tissue only once. SEEK MEDICAL CARE IF:   You have back pain.  You develop a fever.  Your symptoms do not begin to resolve within 3 days. SEEK IMMEDIATE MEDICAL CARE IF:   You have severe back pain or lower abdominal pain.  You develop chills.  You have nausea or vomiting.  You have continued burning or discomfort with urination. MAKE SURE YOU:   Understand these instructions.  Will watch your condition.  Will get help right away if you are not doing well or get worse. Document Released: 05/14/2005 Document Revised: 02/03/2012 Document Reviewed: 09/12/2011 [facility] Patient Information 2014 [place], [place].

## 2013-10-29 NOTE — ED Provider Notes (Signed)
Medical screening examination/treatment/procedure(s) were performed by non-physician practitioner and as supervising physician I was immediately available for consultation/collaboration.  Leslee Home, M.D.  Reuben Likes, MD 10/29/13 [phone]

## 2013-10-31 LAB — URINE CULTURE: Colony Count: >=100000

## 2013-10-31 NOTE — ED Notes (Addendum)
Urine culture: >100,000 colonies Lactobacillus species.  Pt. treated with Keflex. Message sent to Hayden Rasmussen NP. Vassie Moselle 10/31/2013 Treatment adequate per Hayden Rasmussen NP. 11/04/2013

## 2013-11-25 ENCOUNTER — Emergency Department (INDEPENDENT_AMBULATORY_CARE_PROVIDER_SITE_OTHER)
Admission: EM | Admit: 2013-11-25 | Discharge: 2013-11-25 | Disposition: A | Discharge status: Nursing Home (Includes ICF) | Payer: Medicare HMO | Source: Home / Self Care | Attending: Emergency Medicine | Admitting: Emergency Medicine

## 2013-11-25 ENCOUNTER — Encounter (HOSPITAL_COMMUNITY): Payer: Self-pay | Admitting: Emergency Medicine

## 2013-11-25 ENCOUNTER — Emergency Department (HOSPITAL_COMMUNITY)
Admission: EM | Admit: 2013-11-25 | Discharge: 2013-11-25 | Disposition: A | Discharge status: Home / Self Care | Payer: Medicare HMO | Attending: Emergency Medicine | Admitting: Emergency Medicine

## 2013-11-25 ENCOUNTER — Emergency Department (HOSPITAL_COMMUNITY): Payer: Medicare HMO

## 2013-11-25 DIAGNOSIS — J45909 Unspecified asthma, uncomplicated: Secondary | ICD-10-CM | POA: Insufficient documentation

## 2013-11-25 DIAGNOSIS — IMO0001 Reserved for inherently not codable concepts without codable children: Secondary | ICD-10-CM | POA: Insufficient documentation

## 2013-11-25 DIAGNOSIS — E119 Type 2 diabetes mellitus without complications: Secondary | ICD-10-CM | POA: Insufficient documentation

## 2013-11-25 DIAGNOSIS — M79609 Pain in unspecified limb: Secondary | ICD-10-CM

## 2013-11-25 DIAGNOSIS — R5383 Other fatigue: Secondary | ICD-10-CM

## 2013-11-25 DIAGNOSIS — M7989 Other specified soft tissue disorders: Secondary | ICD-10-CM | POA: Insufficient documentation

## 2013-11-25 DIAGNOSIS — R5381 Other malaise: Secondary | ICD-10-CM | POA: Insufficient documentation

## 2013-11-25 DIAGNOSIS — R42 Dizziness and giddiness: Secondary | ICD-10-CM | POA: Insufficient documentation

## 2013-11-25 DIAGNOSIS — R062 Wheezing: Secondary | ICD-10-CM | POA: Insufficient documentation

## 2013-11-25 DIAGNOSIS — M79604 Pain in right leg: Secondary | ICD-10-CM

## 2013-11-25 DIAGNOSIS — Z8739 Personal history of other diseases of the musculoskeletal system and connective tissue: Secondary | ICD-10-CM | POA: Insufficient documentation

## 2013-11-25 DIAGNOSIS — R110 Nausea: Secondary | ICD-10-CM | POA: Insufficient documentation

## 2013-11-25 DIAGNOSIS — Z794 Long term (current) use of insulin: Secondary | ICD-10-CM | POA: Insufficient documentation

## 2013-11-25 DIAGNOSIS — M25569 Pain in unspecified knee: Secondary | ICD-10-CM | POA: Insufficient documentation

## 2013-11-25 DIAGNOSIS — I509 Heart failure, unspecified: Secondary | ICD-10-CM | POA: Insufficient documentation

## 2013-11-25 DIAGNOSIS — Z79899 Other long term (current) drug therapy: Secondary | ICD-10-CM | POA: Insufficient documentation

## 2013-11-25 DIAGNOSIS — I2510 Atherosclerotic heart disease of native coronary artery without angina pectoris: Secondary | ICD-10-CM | POA: Insufficient documentation

## 2013-11-25 DIAGNOSIS — I10 Essential (primary) hypertension: Secondary | ICD-10-CM | POA: Insufficient documentation

## 2013-11-25 DIAGNOSIS — Z880 Allergy status to penicillin: Secondary | ICD-10-CM | POA: Insufficient documentation

## 2013-11-25 DIAGNOSIS — E669 Obesity, unspecified: Secondary | ICD-10-CM | POA: Insufficient documentation

## 2013-11-25 LAB — POCT I-STAT, CHEM 8
BUN: 20 mg/dL (ref 6–23)
CHLORIDE: 100 meq/L (ref 96–112)
CREATININE: 0.8 mg/dL (ref 0.50–1.10)
Calcium, Ion: 1.27 mmol/L — ABNORMAL HIGH (ref 1.12–1.23)
Glucose, Bld: 154 mg/dL — ABNORMAL HIGH (ref 70–99)
HCT: 44 % (ref 36.0–46.0)
Hemoglobin: 15 g/dL (ref 12.0–15.0)
Potassium: 3.9 mEq/L (ref 3.7–5.3)
Sodium: 140 mEq/L (ref 137–147)
TCO2: 28 mmol/L (ref 0–100)

## 2013-11-25 LAB — CBC WITH DIFFERENTIAL/PLATELET
BASOS PCT: 0 % (ref 0–1)
Basophils Absolute: 0 10*3/uL (ref 0.0–0.1)
Eosinophils Absolute: 0.2 10*3/uL (ref 0.0–0.7)
Eosinophils Relative: 3 % (ref 0–5)
HEMATOCRIT: 39.9 % (ref 36.0–46.0)
HEMOGLOBIN: 12.7 g/dL (ref 12.0–15.0)
LYMPHS ABS: 2.9 10*3/uL (ref 0.7–4.0)
LYMPHS PCT: 40 % (ref 12–46)
MCH: 25.6 pg — ABNORMAL LOW (ref 26.0–34.0)
MCHC: 31.8 g/dL (ref 30.0–36.0)
MCV: 80.3 fL (ref 78.0–100.0)
MONO ABS: 0.5 10*3/uL (ref 0.1–1.0)
MONOS PCT: 7 % (ref 3–12)
NEUTROS ABS: 3.6 10*3/uL (ref 1.7–7.7)
NEUTROS PCT: 50 % (ref 43–77)
Platelets: 247 10*3/uL (ref 150–400)
RBC: 4.97 MIL/uL (ref 3.87–5.11)
RDW: 14.5 % (ref 11.5–15.5)
WBC: 7.1 10*3/uL (ref 4.0–10.5)

## 2013-11-25 LAB — POCT URINALYSIS DIP (DEVICE)
Bilirubin Urine: NEGATIVE
Glucose, UA: NEGATIVE mg/dL
Hgb urine dipstick: NEGATIVE
KETONES UR: NEGATIVE mg/dL
LEUKOCYTES UA: NEGATIVE
Nitrite: NEGATIVE
PROTEIN: NEGATIVE mg/dL
Specific Gravity, Urine: >=1.03 (ref 1.005–1.030)
Urobilinogen, UA: 0.2 mg/dL (ref 0.0–1.0)
pH: 5.5 (ref 5.0–8.0)

## 2013-11-25 LAB — D-DIMER, QUANTITATIVE (NOT AT ARMC): D DIMER QUANT: 1.81 ug{FEU}/mL — AB (ref 0.00–0.48)

## 2013-11-25 LAB — TSH: TSH: 2.28 u[IU]/mL (ref 0.350–4.500)

## 2013-11-25 MED ORDER — IOHEXOL 350 MG/ML SOLN
100.0000 mL | Freq: Once | INTRAVENOUS | Status: AC | PRN
Start: 2013-11-25 — End: 2013-11-25
  Administered 2013-11-25: 100 mL via INTRAVENOUS

## 2013-11-25 NOTE — ED Notes (Signed)
Tests  Discussed with the edp pa.  [place] requested

## 2013-11-25 NOTE — Progress Notes (Signed)
*  PRELIMINARY RESULTS* Vascular Ultrasound Right lower extremity venous duplex has been completed.  Preliminary findings: no evidence of DVT or baker's cyst.   Glendale Chard RVT 11/25/2013, 7:01 PM

## 2013-11-25 NOTE — ED Provider Notes (Signed)
CSN: [id number]     Arrival date & time 11/25/13  1750 History  This chart was scribed for non-physician practitioner, Coral Ceo, PA, working with Ethelda Chick, MD by Marica Otter, ED Scribe. This patient was seen in room TR11C/TR11C and the patient's care was started at 9:18 PM.  PCP: Dorrene German, MD  Chief Complaint  Patient presents with  . Leg Pain   HPI HPI Comments: Katelyn Howard is a 53 y.o. female with a history of HTN, DM, CAD, asthma, CHF, and arthritis who presents to the Emergency Department for evaluation of leg pain. Patient seen at [facility] for fatigue and leg pain. Had elevated D-dimer and there is concern for DVT. Sent to the ED for [place]. Patient states she has had right leg swelling and pain which started about 1 week ago. Pain is located throughout her right leg but worse in her lower leg. Pain worse with ambulation and movement. Patient denies any injuries or trauma. Currently on lasix for CHF with no missed doses. No hx of DVT or PE. No recent travel or surgeries. Has not taken anything for pain today. Has hx of arthritis but patient does not believe this is similar pain. Patient also has had intermittent SOB this week but denies this currently. She states her SOB is worse at night. No cough or chest pain. Thinks it may be related to her asthma with intermittent wheezing. Had chest x-ray at [facility] which was negative. Patient also has had generalized fatigue and weakness. Also intermittent nausea and lightheadedness. No abdominal pain, emesis, diarrhea, constipation, fever, dysuria, headaches, focal weakness or numbness. Did not take medications today (HTN).     Past Medical History  Diagnosis Date  . Hypertension   . Diabetes mellitus   . Coronary artery disease   . Asthma   . CHF (congestive heart failure)   . Arthritis   . Obesity    Past Surgical History  Procedure Laterality Date  . Eye surgery    . Abdominal hysterectomy     Family History   Problem Relation Age of Onset  . Stroke Mother    History  Substance Use Topics  . Smoking status: Never Smoker   . Smokeless tobacco: Not on file  . Alcohol Use: No   OB History   Grav Para Term Preterm Abortions TAB SAB Ect Mult Living                  Review of Systems  Constitutional: Positive for fatigue. Negative for fever, diaphoresis, activity change, appetite change and unexpected weight change.  HENT: Negative for congestion, rhinorrhea and sore throat.   Respiratory: Positive for wheezing. Negative for cough and shortness of breath (at night for past week, none today).   Cardiovascular: Positive for leg swelling. Negative for chest pain.  Gastrointestinal: Positive for nausea. Negative for vomiting, abdominal pain and diarrhea.  Genitourinary: Negative for dysuria and difficulty urinating.  Musculoskeletal: Positive for arthralgias (right leg) and myalgias (right leg). Negative for back pain, gait problem and joint swelling.       Swelling of right lower extremity  Skin: Negative for color change and wound.  Neurological: Positive for weakness (generalized ) and light-headedness. Negative for dizziness, numbness and headaches.  All other systems reviewed and are negative.   Allergies  Penicillins and Tetracycline  Home Medications   Current Outpatient Rx  Name  Route  Sig  Dispense  Refill  . albuterol (PROVENTIL HFA;VENTOLIN HFA)  108 (90 BASE) MCG/ACT inhaler   Inhalation   Inhale 2 puffs into the lungs every 6 (six) hours as needed. For wheezing         . atorvastatin (LIPITOR) 20 MG tablet   Oral   Take 20 mg by mouth daily.         . Cholecalciferol (VITAMIN D PO)   Oral   Take 50,000 Units by mouth once a week. thursday         . clotrimazole (LOTRIMIN) 1 % cream   Topical   Apply 1 application topically daily as needed (for rash).         . furosemide (LASIX) 20 MG tablet   Oral   Take 20 mg by mouth daily.         .  hydrochlorothiazide (HYDRODIURIL) 25 MG tablet   Oral   Take 25 mg by mouth daily.         . insulin detemir (LEVEMIR) 100 UNIT/ML injection   Subcutaneous   Inject 60-65 Units into the skin 2 (two) times daily. 65 units in the morning and 60 units at night.         Marland Kitchen lisinopril (PRINIVIL,ZESTRIL) 10 MG tablet   Oral   Take 10 mg by mouth daily.         Marland Kitchen loratadine (CLARITIN) 10 MG tablet   Oral   Take 10 mg by mouth daily.         . nebivolol (BYSTOLIC) 5 MG tablet   Oral   Take 5 mg by mouth daily.         Marland Kitchen omeprazole (PRILOSEC) 20 MG capsule   Oral   Take 20 mg by mouth 2 (two) times daily before a meal.          . ondansetron (ZOFRAN) 4 MG tablet   Oral   Take 1 tablet (4 mg total) by mouth every 6 (six) hours. Prn N and V   15 tablet   0   . potassium chloride (K-DUR) 10 MEQ tablet   Oral   Take 10 mEq by mouth 2 (two) times daily.          . traMADol (ULTRAM) 50 MG tablet   Oral   Take 1 tablet (50 mg total) by mouth every 6 (six) hours as needed.   15 tablet   0    Triage Vitals: BP 161/77  Pulse 62  Temp(Src) 99.1 F (37.3 C) (Oral)  Resp 16  SpO2 100%  Filed Vitals:   11/25/13 1822 11/25/13 2144 11/25/13 2351  BP: 161/77 160/78 136/78  Pulse: 62 65 65  Temp: 99.1 F (37.3 C)  98.1 F (36.7 C)  TempSrc: Oral  Oral  Resp: 16 20 20   SpO2: 100% 100% 100%    Physical Exam  Nursing note and vitals reviewed. Constitutional: She is oriented to person, place, and time. She appears well-developed and well-nourished. No distress.  HENT:  Head: Normocephalic and atraumatic.  Right Ear: External ear normal.  Left Ear: External ear normal.  Mouth/Throat: Oropharynx is clear and moist.  Eyes: Conjunctivae and EOM are normal. Right eye exhibits no discharge. Left eye exhibits no discharge.  Neck: Normal range of motion. Neck supple. No tracheal deviation present.  Cardiovascular: Normal rate, regular rhythm, normal heart sounds and intact  distal pulses.  Exam reveals no gallop and no friction rub.   No murmur heard. Dorsalis pedis pulses present and equal bilaterally  Pulmonary/Chest: Effort normal and  breath sounds normal. No respiratory distress. She has no wheezes. She has no rales. She exhibits no tenderness.  Abdominal: Soft. She exhibits no distension. There is no tenderness.  Musculoskeletal: Normal range of motion. She exhibits edema and tenderness.       Legs: Non-pitting edema to the feet bilaterally. Vague diffuse tenderness to palpation to the right calf, knee, and distal thigh throughout with no focal tenderness. No limitations with ROM of the right leg. Pain with flexion and extension of the right knee. Patient able to ambulate without difficulty or ataxia. No ecchymosis, erythema or wounds throughout the LE.   Neurological: She is alert and oriented to person, place, and time.  GCS 15. No focal neurological deficits. Sensation intact in the LE throughout.   Skin: Skin is warm and dry. She is not diaphoretic. No erythema.  Psychiatric: She has a normal mood and affect. Her behavior is normal.    ED Course  Procedures (including critical care time) DIAGNOSTIC STUDIES: Oxygen Saturation is 100% on RA, normal by my interpretation.    COORDINATION OF CARE: 9:28 PM-Discussed treatment plan which includes imaging with pt at bedside and pt agreed to plan.   Labs Review Labs Reviewed - No data to display Imaging Review Ct Angio Chest Pe W/cm &/or Wo Cm  11/25/2013   CLINICAL DATA:  Right leg swelling and calf pain.  EXAM: CT ANGIOGRAPHY CHEST WITH CONTRAST  TECHNIQUE: Multidetector CT imaging of the chest was performed using the standard protocol during bolus administration of intravenous contrast. Multiplanar CT image reconstructions and MIPs were obtained to evaluate the vascular anatomy.  CONTRAST:  100mL OMNIPAQUE IOHEXOL 350 MG/ML SOLN  COMPARISON:  CT chest from 10/20/2009  FINDINGS: No pleural effusion  identified. There is a tiny nodule identified within the right upper lobe which measures 4 mm, image 38/series 6.  The heart size is mildly enlarged. No pericardial effusion. No pathologically enlarged mediastinal or hilar lymph nodes identified. Apparent right subclavian artery is identified. The main pulmonary artery is patent. No abnormal filling defects within the lobar or segmental pulmonary arteries to suggest an acute pulmonary embolus. Prominent left axillary lymph nodes are identified and appears similar to previous exam. Index node measures 1 cm, image number 18/ series 4. Previously 1.3 cm. Adjacent left axillary lymph node measures 1.4 cm, image 24/series 4. No supra clavicular adenopathy.  Incidental imaging through the upper abdomen is unremarkable.  Review of the MIP images confirms the above findings.  IMPRESSION: 1. No evidence for acute pulmonary embolus. 2. Small pulmonary nodule in a right upper lobe measures 4 mm. If the patient is at high risk for bronchogenic carcinoma, follow-up chest CT at 1 year is recommended. If the patient is at low risk, no follow-up is needed. This recommendation follows the consensus statement: Guidelines for Management of Small Pulmonary Nodules Detected on CT Scans: A Statement from the Fleischner Society as published in Radiology 2005; 237:395-400. 3. Borderline enlarged left axillary lymph nodes.   Electronically Signed   By: Signa Kellaylor  Stroud M.D.   On: 11/25/2013 22:48     EKG Interpretation None      CBC WITH DIFFERENTIAL      Result Value Ref Range   WBC 7.1  4.0 - 10.5 K/uL   RBC 4.97  3.87 - 5.11 MIL/uL   Hemoglobin 12.7  12.0 - 15.0 g/dL   HCT 13.239.9  44.036.0 - 10.246.0 %   MCV 80.3  78.0 - 100.0 fL   MCH 25.6 (*)  26.0 - 34.0 pg   MCHC 31.8  30.0 - 36.0 g/dL   RDW 20.9  47.0 - 96.2 %   Platelets 247  150 - 400 K/uL   Neutrophils Relative % 50  43 - 77 %   Neutro Abs 3.6  1.7 - 7.7 K/uL   Lymphocytes Relative 40  12 - 46 %   Lymphs Abs 2.9  0.7 -  4.0 K/uL   Monocytes Relative 7  3 - 12 %   Monocytes Absolute 0.5  0.1 - 1.0 K/uL   Eosinophils Relative 3  0 - 5 %   Eosinophils Absolute 0.2  0.0 - 0.7 K/uL   Basophils Relative 0  0 - 1 %   Basophils Absolute 0.0  0.0 - 0.1 K/uL  TSH      Result Value Ref Range   TSH 2.280  0.350 - 4.500 uIU/mL  D-DIMER, QUANTITATIVE      Result Value Ref Range   D-Dimer, Quant 1.81 (*) 0.00 - 0.48 ug/mL-FEU  POCT URINALYSIS DIP (DEVICE)      Result Value Ref Range   Glucose, UA NEGATIVE  NEGATIVE mg/dL   Bilirubin Urine NEGATIVE  NEGATIVE   Ketones, ur NEGATIVE  NEGATIVE mg/dL   Specific Gravity, Urine >=1.030  1.005 - 1.030   Hgb urine dipstick NEGATIVE  NEGATIVE   pH 5.5  5.0 - 8.0   Protein, ur NEGATIVE  NEGATIVE mg/dL   Urobilinogen, UA 0.2  0.0 - 1.0 mg/dL   Nitrite NEGATIVE  NEGATIVE   Leukocytes, UA NEGATIVE  NEGATIVE  POCT I-STAT, CHEM 8      Result Value Ref Range   Sodium 140  137 - 147 mEq/L   Potassium 3.9  3.7 - 5.3 mEq/L   Chloride 100  96 - 112 mEq/L   BUN 20  6 - 23 mg/dL   Creatinine, Ser 8.36  0.50 - 1.10 mg/dL   Glucose, Bld 629 (*) 70 - 99 mg/dL   Calcium, Ion 4.76 (*) 1.12 - 1.23 mmol/L   TCO2 28  0 - 100 mmol/L   Hemoglobin 15.0  12.0 - 15.0 g/dL   HCT 54.6  50.3 - 54.6 %   *PRELIMINARY RESULTS*  Vascular Ultrasound  Right lower extremity venous duplex has been completed. Preliminary findings: no evidence of DVT or baker's cyst.  Glendale Chard RVT  11/25/2013, 7:01 PM   MDM   Katelyn Howard is a 53 y.o. female with a history of HTN, DM, CAD, asthma, CHF, and arthritis who presents to the Emergency Department for evaluation of leg pain. Etiology of leg pain possibly muscular in nature. [place] negative for DVT. No hx of trauma or injury. Doubt fracture. Patient neurovascularly intact. No evidence of cellulitis, joint infection, or infectious process. Previous right knee x-rays reviewed which shows OA and could be another possible cause of pain. Patient also  complained of fatigue which was evaluated at [facility]. Labs and EKG reviewed. Etiology of fatigue unclear. Labs unremarkable. No UTI. No focal neurological deficits on exam. No focal weakness. Possibly due to chronic medical problems. Patient also had intermittent SOB this week but denied this during her ED visit. Lungs clear to auscultation. No hypoxia, respiratory distress, or tachypnea. Concern from UC MD for PE. CT angio performed which was negative for PE. Patient informed of incidental pulmonary nodules. Patient had high BP throughout ED visit but did not take medications today. Patient to follow-up with PCP regarding these issues. Return precautions, discharge  instructions, and follow-up was discussed with the patient before discharge.     Rechecks  11:45 PM = Informed patient of CT angio results. Follow-up with PCP regarding incidental pulmonary nodules.    Discharge Medication List as of 11/25/2013 11:52 PM      Final impressions: 1. Right leg pain   2. Fatigue   3. Hypertension      Luiz Iron PA-C   This patient was discussed with Dr. Nonie Hoyer, PA-C 11/27/13 1240

## 2013-11-25 NOTE — ED Notes (Signed)
Pt returned to room from CT. No distress noted.

## 2013-11-25 NOTE — Discharge Instructions (Signed)
Follow-up with your doctor for continued care - call as soon as possible  Return to the emergency department if you develop any changing/worsening condition, fever, repeated vomiting, chest pain, weakness, loss of sensation, difficulty breathing, or any other concerns (please read additional information regarding your condition below)     Fatigue Fatigue is a feeling of tiredness, lack of energy, lack of motivation, or feeling tired all the time. Having enough rest, good nutrition, and reducing stress will normally reduce fatigue. Consult your caregiver if it persists. The nature of your fatigue will help your caregiver to find out its cause. The treatment is based on the cause.  CAUSES  There are many causes for fatigue. Most of the time, fatigue can be traced to one or more of your habits or routines. Most causes fit into one or more of three general areas. They are: Lifestyle problems  Sleep disturbances.  Overwork.  Physical exertion.  Unhealthy habits.  Poor eating habits or eating disorders.  Alcohol and/or drug use .  Lack of proper nutrition (malnutrition). Psychological problems  Stress and/or anxiety problems.  Depression.  Grief.  Boredom. Medical Problems or Conditions  Anemia.  Pregnancy.  Thyroid gland problems.  Recovery from major surgery.  Continuous pain.  Emphysema or asthma that is not well controlled  Allergic conditions.  Diabetes.  Infections (such as mononucleosis).  Obesity.  Sleep disorders, such as sleep apnea.  Heart failure or other heart-related problems.  Cancer.  Kidney disease.  Liver disease.  Effects of certain medicines such as antihistamines, cough and cold remedies, prescription pain medicines, heart and blood pressure medicines, drugs used for treatment of cancer, and some antidepressants. SYMPTOMS  The symptoms of fatigue include:   Lack of energy.  Lack of drive (motivation).  Drowsiness.  Feeling of  indifference to the surroundings. DIAGNOSIS  The details of how you feel help guide your caregiver in finding out what is causing the fatigue. You will be asked about your present and past health condition. It is important to review all medicines that you take, including prescription and non-prescription items. A thorough exam will be done. You will be questioned about your feelings, habits, and normal lifestyle. Your caregiver may suggest blood tests, urine tests, or other tests to look for common medical causes of fatigue.  TREATMENT  Fatigue is treated by correcting the underlying cause. For example, if you have continuous pain or depression, treating these causes will improve how you feel. Similarly, adjusting the dose of certain medicines will help in reducing fatigue.  HOME CARE INSTRUCTIONS   Try to get the required amount of good sleep every night.  Eat a healthy and nutritious diet, and drink enough water throughout the day.  Practice ways of relaxing (including yoga or meditation).  Exercise regularly.  Make plans to change situations that cause stress. Act on those plans so that stresses decrease over time. Keep your work and personal routine reasonable.  Avoid street drugs and minimize use of alcohol.  Start taking a daily multivitamin after consulting your caregiver. SEEK MEDICAL CARE IF:   You have persistent tiredness, which cannot be accounted for.  You have fever.  You have unintentional weight loss.  You have headaches.  You have disturbed sleep throughout the night.  You are feeling sad.  You have constipation.  You have dry skin.  You have gained weight.  You are taking any new or different medicines that you suspect are causing fatigue.  You are unable to sleep  at night.  You develop any unusual swelling of your legs or other parts of your body. SEEK IMMEDIATE MEDICAL CARE IF:   You are feeling confused.  Your vision is blurred.  You feel faint  or pass out.  You develop severe headache.  You develop severe abdominal, pelvic, or back pain.  You develop chest pain, shortness of breath, or an irregular or fast heartbeat.  You are unable to pass a normal amount of urine.  You develop abnormal bleeding such as bleeding from the rectum or you vomit blood.  You have thoughts about harming yourself or committing suicide.  You are worried that you might harm someone else. MAKE SURE YOU:   Understand these instructions.  Will watch your condition.  Will get help right away if you are not doing well or get worse. Document Released: 06/01/2007 Document Revised: 10/27/2011 Document Reviewed: 06/01/2007 [facility] Patient Information 2014 [place], [place].  Musculoskeletal Pain Musculoskeletal pain is muscle and boney aches and pains. These pains can occur in any part of the body. Your caregiver may treat you without knowing the cause of the pain. They may treat you if blood or urine tests, X-rays, and other tests were normal.  CAUSES There is often not a definite cause or reason for these pains. These pains may be caused by a type of germ (virus). The discomfort may also come from overuse. Overuse includes working out too hard when your body is not fit. Boney aches also come from weather changes. Bone is sensitive to atmospheric pressure changes. HOME CARE INSTRUCTIONS  Ask when your test results will be ready. Make sure you get your test results. Only take over-the-counter or prescription medicines for pain, discomfort, or fever as directed by your caregiver. If you were given medications for your condition, do not drive, operate machinery or power tools, or sign legal documents for 24 hours. Do not drink alcohol. Do not take sleeping pills or other medications that may interfere with treatment. Continue all activities unless the activities cause more pain. When the pain lessens, slowly resume normal activities. Gradually increase the  intensity and duration of the activities or exercise. During periods of severe pain, bed rest may be helpful. Lay or sit in any position that is comfortable. Putting ice on the injured area. Put ice in a bag. Place a towel between your skin and the bag. Leave the ice on for 15 to 20 minutes, 3 to 4 times a day. Follow up with your caregiver for continued problems and no reason can be found for the pain. If the pain becomes worse or does not go away, it may be necessary to repeat tests or do additional testing. Your caregiver may need to look further for a possible cause. SEEK IMMEDIATE MEDICAL CARE IF: You have pain that is getting worse and is not relieved by medications. You develop chest pain that is associated with shortness or breath, sweating, feeling sick to your stomach (nauseous), or throw up (vomit). Your pain becomes localized to the abdomen. You develop any new symptoms that seem different or that concern you. MAKE SURE YOU:  Understand these instructions. Will watch your condition. Will get help right away if you are not doing well or get worse. Document Released: 08/04/2005 Document Revised: 10/27/2011 Document Reviewed: 04/08/2013 [facility] Patient Information 2014 [place], [place].  RICE: Routine Care for Injuries The routine care of many injuries includes Rest, Ice, Compression, and Elevation (RICE). HOME CARE INSTRUCTIONS Rest is needed to allow your body  to heal. Routine activities can usually be resumed when comfortable. Injured tendons and bones can take up to 6 weeks to heal. Tendons are the cord-like structures that attach muscle to bone. Ice following an injury helps keep the swelling down and reduces pain. Put ice in a plastic bag. Place a towel between your skin and the bag. Leave the ice on for 15-20 minutes, 03-04 times a day. Do this while awake, for the first 24 to 48 hours. After that, continue as directed by your caregiver. Compression helps keep swelling  down. It also gives support and helps with discomfort. If an elastic bandage has been applied, it should be removed and reapplied every 3 to 4 hours. It should not be applied tightly, but firmly enough to keep swelling down. Watch fingers or toes for swelling, bluish discoloration, coldness, numbness, or excessive pain. If any of these problems occur, remove the bandage and reapply loosely. Contact your caregiver if these problems continue. Elevation helps reduce swelling and decreases pain. With extremities, such as the arms, hands, legs, and feet, the injured area should be placed near or above the level of the heart, if possible. SEEK IMMEDIATE MEDICAL CARE IF: You have persistent pain and swelling. You develop redness, numbness, or unexpected weakness. Your symptoms are getting worse rather than improving after several days. These symptoms may indicate that further evaluation or further X-rays are needed. Sometimes, X-rays may not show a small broken bone (fracture) until 1 week or 10 days later. Make a follow-up appointment with your caregiver. Ask when your X-ray results will be ready. Make sure you get your X-ray results. Document Released: 11/16/2000 Document Revised: 10/27/2011 Document Reviewed: 01/03/2011 [facility] Patient Information 2014 [place], [place].

## 2013-11-25 NOTE — ED Notes (Signed)
Pt c/o feeling tired, weakness, nauseas onset 8 days Hx of DM, HTN, CHF Has not had any meds today Sugar level yest was 190 Alert w/no signs of acute distress.

## 2013-11-25 NOTE — ED Provider Notes (Signed)
Chief Complaint   Chief Complaint  Patient presents with  . Fatigue    History of Present Illness   Katelyn Howard is a 53 year old female with a history of type 2 diabetes, asthma, hypertension, and congestive heart failure who presents this evening with a one-week history of generalized weakness, fatigue, and lack of energy. She also has had lightheadedness, sweats, shortness of breath, wheezing, aching in her neck, nausea, swelling of both of her legs, aching in her right leg, frequent urination, excessive thirst, and a rash on her back. She denies fever, headache, URI symptoms, chest pain, syncope, abdominal pain, vomiting, diarrhea, or urinary symptoms. She was here about a month ago with similar symptoms. No definite cause was found. She was treated for urinary tract infection, but does not feel like she is any better.  Review of Systems   Other than as noted above, the patient denies any of the following symptoms. Systemic:  No fever, chills, sweats, myalgias, headache, or anorexia. ENT:  No nasal congestion, rhinorrhea, or sore throat. Lungs:  No cough, sputum production, wheezing, shortness of breath. No loud snoring, choking or gasping at night, or unrefreshing sleep. Cardiovascular:  No chest pain, palpitations, or syncope. GI:  No nausea, vomiting, abdominal pain or diarrhea. GU:  No dysuria, frequency, or hematuria. Skin:  No rash or pruritis. Psych:  No history of depression or anxiety.  PMFSH   Past medical history, family history, social history, meds, and allergies were reviewed.  She is allergic to penicillin. She has a history of diabetes, asthma, hypertension, and congestive heart failure. Current meds include Lipitor, Nexium, Lasix, hydrochlorothiazide, Levemir insulin, lisinopril, albuterol, Bystolic, tramadol, and Nasacort.  Physical Examination     Vital signs:  BP 167/116  Pulse 69  Temp(Src) 97.9 F (36.6 C) (Oral)  Resp 22  SpO2 100% General:   Alert, in no distress. Eye:  PERRL, full EOMs.  Lids and conjunctivas were normal. ENT:  TMs and canals were normal, without erythema or inflammation.  Nasal mucosa was clear and uncongested, without drainage.  Mucous membranes were moist.  Pharynx was clear, without exudate or drainage.  There were no oral ulcerations or lesions. Neck:  Supple, no adenopathy, tenderness or mass. Thyroid was normal. Lungs:  No respiratory distress.  Lungs were clear to auscultation, without wheezes, rales or rhonchi.  Breath sounds were clear and equal bilaterally. Heart:  Regular rhythm, without gallops, murmers or rubs. Abdomen:  Soft, flat, and non-tender to palpation.  No hepatosplenomagaly or mass. Extremities: Both legs are markedly swollen with pitting and nonpitting edema of the ankles and the feet. She has mild calf tenderness and a mildly positive Homans sign on the right. Skin:  Clear, warm, and dry, without rash or lesions. Psych:  Normal mood and affect.  Labs   Results for orders placed during the hospital encounter of 11/25/13  CBC WITH DIFFERENTIAL      Result Value Ref Range   WBC 7.1  4.0 - 10.5 K/uL   RBC 4.97  3.87 - 5.11 MIL/uL   Hemoglobin 12.7  12.0 - 15.0 g/dL   HCT 16.1  09.6 - 04.5 %   MCV 80.3  78.0 - 100.0 fL   MCH 25.6 (*) 26.0 - 34.0 pg   MCHC 31.8  30.0 - 36.0 g/dL   RDW 40.9  81.1 - 91.4 %   Platelets 247  150 - 400 K/uL   Neutrophils Relative % 50  43 - 77 %   Neutro  Abs 3.6  1.7 - 7.7 K/uL   Lymphocytes Relative 40  12 - 46 %   Lymphs Abs 2.9  0.7 - 4.0 K/uL   Monocytes Relative 7  3 - 12 %   Monocytes Absolute 0.5  0.1 - 1.0 K/uL   Eosinophils Relative 3  0 - 5 %   Eosinophils Absolute 0.2  0.0 - 0.7 K/uL   Basophils Relative 0  0 - 1 %   Basophils Absolute 0.0  0.0 - 0.1 K/uL  D-DIMER, QUANTITATIVE      Result Value Ref Range   D-Dimer, Quant 1.81 (*) 0.00 - 0.48 ug/mL-FEU  POCT URINALYSIS DIP (DEVICE)      Result Value Ref Range   Glucose, UA NEGATIVE   NEGATIVE mg/dL   Bilirubin Urine NEGATIVE  NEGATIVE   Ketones, ur NEGATIVE  NEGATIVE mg/dL   Specific Gravity, Urine >=1.030  1.005 - 1.030   Hgb urine dipstick NEGATIVE  NEGATIVE   pH 5.5  5.0 - 8.0   Protein, ur NEGATIVE  NEGATIVE mg/dL   Urobilinogen, UA 0.2  0.0 - 1.0 mg/dL   Nitrite NEGATIVE  NEGATIVE   Leukocytes, UA NEGATIVE  NEGATIVE  POCT I-STAT, CHEM 8      Result Value Ref Range   Sodium 140  137 - 147 mEq/L   Potassium 3.9  3.7 - 5.3 mEq/L   Chloride 100  96 - 112 mEq/L   BUN 20  6 - 23 mg/dL   Creatinine, Ser 0.35  0.50 - 1.10 mg/dL   Glucose, Bld 465 (*) 70 - 99 mg/dL   Calcium, Ion 6.81 (*) 1.12 - 1.23 mmol/L   TCO2 28  0 - 100 mmol/L   Hemoglobin 15.0  12.0 - 15.0 g/dL   HCT 27.5  17.0 - 01.7 %    EKG Results:  Date: 11/25/2013  Rate: 61  Rhythm: normal sinus rhythm  QRS Axis: normal  Intervals: PR prolonged  ST/T Wave abnormalities: normal  Conduction Disutrbances:first-degree A-V block   Narrative Interpretation: Sinus rhythm with first degree AV block, otherwise normal EKG.  Old EKG Reviewed: none available   Assessment   The encounter diagnosis was Fatigue.  The only positive finding on the lab workup was the positive d-dimer. This coupled with her shortness of breath and right leg pain are suggestive of pulmonary embolism. I think she needs a CT angiogram of her chest.  Plan     The patient was transferred to the ED via shuttle in stable condition.  Medical Decision Making:  53 year old female with DM, HT, and CHF presents with a 1 week history of weakness, fatigue, dizziness, shortness of breath.  No chest pain or fever.  She does have right leg pain.  Workup here reveals a d-dimer of 1.81.  Her EKG, iStat 8, Ua and CBC were all normal.  I am suspicious of a PE.  Needs pulmonary CT angiogram.       Reuben Likes, MD 11/25/13 [phone]

## 2013-11-25 NOTE — ED Notes (Signed)
The pt was [facility] with [facility] leg pain for approx one week.  She had blood work and an Personal assistant there.  No recent car trips or plane trips.

## 2013-11-25 NOTE — Discharge Instructions (Signed)
We have determined that your problem requires further evaluation in the emergency department.  We will take care of your transport there.  Once at the emergency department, you will be evaluated by a provider and they will order whatever treatment or tests they deem necessary.  We cannot guarantee that they will do any specific test or do any specific treatment.  ° °

## 2013-11-26 LAB — URINE CULTURE: Colony Count: 5000

## 2013-11-27 NOTE — ED Provider Notes (Signed)
Medical screening examination/treatment/procedure(s) were performed by non-physician practitioner and as supervising physician I was immediately available for consultation/collaboration.   EKG Interpretation None       Jessly Lebeck K Linker, MD 11/27/13 1502 

## 2013-11-27 NOTE — Progress Notes (Signed)
Quick Note:  Test result was normal. No further action is needed at this time. ______ 

## 2013-11-28 LAB — POCT I-STAT, CHEM 8
BUN: 20 mg/dL (ref 6–23)
CREATININE: 0.9 mg/dL (ref 0.50–1.10)
Calcium, Ion: 1.24 mmol/L — ABNORMAL HIGH (ref 1.12–1.23)
Chloride: 100 mEq/L (ref 96–112)
Glucose, Bld: 158 mg/dL — ABNORMAL HIGH (ref 70–99)
HCT: 44 % (ref 36.0–46.0)
HEMOGLOBIN: 15 g/dL (ref 12.0–15.0)
POTASSIUM: 4 meq/L (ref 3.7–5.3)
SODIUM: 141 meq/L (ref 137–147)
TCO2: 30 mmol/L (ref 0–100)

## 2014-06-30 ENCOUNTER — Emergency Department (HOSPITAL_COMMUNITY)
Admission: EM | Admit: 2014-06-30 | Discharge: 2014-07-01 | Disposition: A | Discharge status: Home / Self Care | Payer: Medicare HMO | Attending: Emergency Medicine | Admitting: Emergency Medicine

## 2014-06-30 ENCOUNTER — Encounter (HOSPITAL_COMMUNITY): Payer: Self-pay | Admitting: *Deleted

## 2014-06-30 DIAGNOSIS — M179 Osteoarthritis of knee, unspecified: Secondary | ICD-10-CM | POA: Diagnosis not present

## 2014-06-30 DIAGNOSIS — I10 Essential (primary) hypertension: Secondary | ICD-10-CM | POA: Insufficient documentation

## 2014-06-30 DIAGNOSIS — R350 Frequency of micturition: Secondary | ICD-10-CM | POA: Insufficient documentation

## 2014-06-30 DIAGNOSIS — I2510 Atherosclerotic heart disease of native coronary artery without angina pectoris: Secondary | ICD-10-CM | POA: Insufficient documentation

## 2014-06-30 DIAGNOSIS — E669 Obesity, unspecified: Secondary | ICD-10-CM | POA: Insufficient documentation

## 2014-06-30 DIAGNOSIS — J45909 Unspecified asthma, uncomplicated: Secondary | ICD-10-CM | POA: Diagnosis not present

## 2014-06-30 DIAGNOSIS — Z79899 Other long term (current) drug therapy: Secondary | ICD-10-CM | POA: Diagnosis not present

## 2014-06-30 DIAGNOSIS — G8929 Other chronic pain: Secondary | ICD-10-CM | POA: Insufficient documentation

## 2014-06-30 DIAGNOSIS — I509 Heart failure, unspecified: Secondary | ICD-10-CM | POA: Insufficient documentation

## 2014-06-30 DIAGNOSIS — M25561 Pain in right knee: Secondary | ICD-10-CM

## 2014-06-30 DIAGNOSIS — E1165 Type 2 diabetes mellitus with hyperglycemia: Secondary | ICD-10-CM | POA: Insufficient documentation

## 2014-06-30 DIAGNOSIS — R42 Dizziness and giddiness: Secondary | ICD-10-CM | POA: Diagnosis not present

## 2014-06-30 DIAGNOSIS — R739 Hyperglycemia, unspecified: Secondary | ICD-10-CM

## 2014-06-30 DIAGNOSIS — Z880 Allergy status to penicillin: Secondary | ICD-10-CM | POA: Insufficient documentation

## 2014-06-30 DIAGNOSIS — M1990 Unspecified osteoarthritis, unspecified site: Secondary | ICD-10-CM

## 2014-06-30 DIAGNOSIS — Z794 Long term (current) use of insulin: Secondary | ICD-10-CM | POA: Insufficient documentation

## 2014-06-30 LAB — URINE MICROSCOPIC-ADD ON

## 2014-06-30 LAB — COMPREHENSIVE METABOLIC PANEL
ALK PHOS: 104 U/L (ref 39–117)
ALT: 24 U/L (ref 0–35)
AST: 23 U/L (ref 0–37)
Albumin: 3.7 g/dL (ref 3.5–5.2)
Anion gap: 15 (ref 5–15)
BUN: 16 mg/dL (ref 6–23)
CO2: 24 mEq/L (ref 19–32)
Calcium: 9.9 mg/dL (ref 8.4–10.5)
Chloride: 98 mEq/L (ref 96–112)
Creatinine, Ser: 0.72 mg/dL (ref 0.50–1.10)
GLUCOSE: 219 mg/dL — AB (ref 70–99)
POTASSIUM: 4.1 meq/L (ref 3.7–5.3)
Sodium: 137 mEq/L (ref 137–147)
Total Bilirubin: <0.2 mg/dL — ABNORMAL LOW (ref 0.3–1.2)
Total Protein: 8.4 g/dL — ABNORMAL HIGH (ref 6.0–8.3)

## 2014-06-30 LAB — CBC WITH DIFFERENTIAL/PLATELET
Basophils Absolute: 0 10*3/uL (ref 0.0–0.1)
Basophils Relative: 0 % (ref 0–1)
Eosinophils Absolute: 0.3 10*3/uL (ref 0.0–0.7)
Eosinophils Relative: 4 % (ref 0–5)
HEMATOCRIT: 35.2 % — AB (ref 36.0–46.0)
HEMOGLOBIN: 11 g/dL — AB (ref 12.0–15.0)
LYMPHS ABS: 2.5 10*3/uL (ref 0.7–4.0)
Lymphocytes Relative: 35 % (ref 12–46)
MCH: 25.1 pg — AB (ref 26.0–34.0)
MCHC: 31.3 g/dL (ref 30.0–36.0)
MCV: 80.2 fL (ref 78.0–100.0)
Monocytes Absolute: 0.4 10*3/uL (ref 0.1–1.0)
Monocytes Relative: 5 % (ref 3–12)
NEUTROS ABS: 4.1 10*3/uL (ref 1.7–7.7)
NEUTROS PCT: 56 % (ref 43–77)
Platelets: 235 10*3/uL (ref 150–400)
RBC: 4.39 MIL/uL (ref 3.87–5.11)
RDW: 14.5 % (ref 11.5–15.5)
WBC: 7.2 10*3/uL (ref 4.0–10.5)

## 2014-06-30 LAB — URINALYSIS, ROUTINE W REFLEX MICROSCOPIC
BILIRUBIN URINE: NEGATIVE
Glucose, UA: NEGATIVE mg/dL
Hgb urine dipstick: NEGATIVE
KETONES UR: NEGATIVE mg/dL
Nitrite: NEGATIVE
PROTEIN: NEGATIVE mg/dL
Specific Gravity, Urine: 1.018 (ref 1.005–1.030)
Urobilinogen, UA: 0.2 mg/dL (ref 0.0–1.0)
pH: 5 (ref 5.0–8.0)

## 2014-06-30 MED ORDER — ONDANSETRON 4 MG PO TBDP
8.0000 mg | ORAL_TABLET | Freq: Once | ORAL | Status: AC
  Administered 2014-06-30: 8 mg via ORAL
  Filled 2014-06-30: qty 2

## 2014-06-30 NOTE — ED Notes (Signed)
Pt reports dizziness for two days, increase urination and dysuria, n/v, and right leg pain. Pt states symptoms feel the same when she had a kidney and bladder infection.

## 2014-07-01 ENCOUNTER — Emergency Department (HOSPITAL_COMMUNITY): Payer: Medicare HMO

## 2014-07-01 DIAGNOSIS — M179 Osteoarthritis of knee, unspecified: Secondary | ICD-10-CM | POA: Diagnosis not present

## 2014-07-01 MED ORDER — TRAMADOL HCL 50 MG PO TABS: 50.0000 mg | ORAL_TABLET | Freq: Four times a day (QID) | ORAL | Status: DC | PRN

## 2014-07-01 NOTE — Discharge Instructions (Signed)
Arthritis, Nonspecific °Arthritis is inflammation of a joint. This usually means pain, redness, warmth or swelling are present. One or more joints may be involved. There are a number of types of arthritis. Your caregiver may not be able to tell what type of arthritis you have right away. °CAUSES  °The most common cause of arthritis is the wear and tear on the joint (osteoarthritis). This causes damage to the cartilage, which can break down over time. The knees, hips, back and neck are most often affected by this type of arthritis. °Other types of arthritis and common causes of joint pain include: °· Sprains and other injuries near the joint. Sometimes minor sprains and injuries cause pain and swelling that develop hours later. °· Rheumatoid arthritis. This affects hands, feet and knees. It usually affects both sides of your body at the same time. It is often associated with chronic ailments, fever, weight loss and general weakness. °· Crystal arthritis. Gout and pseudo gout can cause occasional acute severe pain, redness and swelling in the foot, ankle, or knee. °· Infectious arthritis. Bacteria can get into a joint through a break in overlying skin. This can cause infection of the joint. Bacteria and viruses can also spread through the blood and affect your joints. °· Drug, infectious and allergy reactions. Sometimes joints can become mildly painful and slightly swollen with these types of illnesses. °SYMPTOMS  °· Pain is the main symptom. °· Your joint or joints can also be red, swollen and warm or hot to the touch. °· You may have a fever with certain types of arthritis, or even feel overall ill. °· The joint with arthritis will hurt with movement. Stiffness is present with some types of arthritis. °DIAGNOSIS  °Your caregiver will suspect arthritis based on your description of your symptoms and on your exam. Testing may be needed to find the type of arthritis: °· Blood and sometimes urine tests. °· X-ray tests  and sometimes CT or MRI scans. °· Removal of fluid from the joint (arthrocentesis) is done to check for bacteria, crystals or other causes. Your caregiver (or a specialist) will numb the area over the joint with a local anesthetic, and use a needle to remove joint fluid for examination. This procedure is only minimally uncomfortable. °· Even with these tests, your caregiver may not be able to tell what kind of arthritis you have. Consultation with a specialist (rheumatologist) may be helpful. °TREATMENT  °Your caregiver will discuss with you treatment specific to your type of arthritis. If the specific type cannot be determined, then the following general recommendations may apply. °Treatment of severe joint pain includes: °· Rest. °· Elevation. °· Anti-inflammatory medication (for example, ibuprofen) may be prescribed. Avoiding activities that cause increased pain. °· Only take over-the-counter or prescription medicines for pain and discomfort as recommended by your caregiver. °· Cold packs over an inflamed joint may be used for 10 to 15 minutes every hour. Hot packs sometimes feel better, but do not use overnight. Do not use hot packs if you are diabetic without your caregiver's permission. °· A cortisone shot into arthritic joints may help reduce pain and swelling. °· Any acute arthritis that gets worse over the next 1 to 2 days needs to be looked at to be sure there is no joint infection. °Long-term arthritis treatment involves modifying activities and lifestyle to reduce joint stress jarring. This can include weight loss. Also, exercise is needed to nourish the joint cartilage and remove waste. This helps keep the muscles   around the joint strong. HOME CARE INSTRUCTIONS   Do not take aspirin to relieve pain if gout is suspected. This elevates uric acid levels.  Only take over-the-counter or prescription medicines for pain, discomfort or fever as directed by your caregiver.  Rest the joint as much as  possible.  If your joint is swollen, keep it elevated.  Use crutches if the painful joint is in your leg.  Drinking plenty of fluids may help for certain types of arthritis.  Follow your caregiver's dietary instructions.  Try low-impact exercise such as:  Swimming.  Water aerobics.  Biking.  Walking.  Morning stiffness is often relieved by a warm shower.  Put your joints through regular range-of-motion. SEEK MEDICAL CARE IF:   You do not feel better in 24 hours or are getting worse.  You have side effects to medications, or are not getting better with treatment. SEEK IMMEDIATE MEDICAL CARE IF:   You have a fever.  You develop severe joint pain, swelling or redness.  Many joints are involved and become painful and swollen.  There is severe back pain and/or leg weakness.  You have loss of bowel or bladder control. Document Released: 09/11/2004 Document Revised: 10/27/2011 Document Reviewed: 09/27/2008 [facility] Patient Information 2015 [place], [place]. This information is not intended to replace advice given to you by your health care provider. Make sure you discuss any questions you have with your health care provider.  Dizziness Dizziness is a common problem. It is a feeling of unsteadiness or light-headedness. You may feel like you are about to faint. Dizziness can lead to injury if you stumble or fall. A person of any age group can suffer from dizziness, but dizziness is more common in older adults. CAUSES  Dizziness can be caused by many different things, including:  Middle ear problems.  Standing for too long.  Infections.  An allergic reaction.  Aging.  An emotional response to something, such as the sight of blood.  Side effects of medicines.  Tiredness.  Problems with circulation or blood pressure.  Excessive use of alcohol or medicines, or illegal drug use.  Breathing too fast (hyperventilation).  An irregular heart rhythm (arrhythmia).  A  low red blood cell count (anemia).  Pregnancy.  Vomiting, diarrhea, fever, or other illnesses that cause body fluid loss (dehydration).  Diseases or conditions such as Parkinson's disease, high blood pressure (hypertension), diabetes, and thyroid problems.  Exposure to extreme heat. DIAGNOSIS  Your health care provider will ask about your symptoms, perform a physical exam, and perform an electrocardiogram (ECG) to record the electrical activity of your heart. Your health care provider may also perform other heart or blood tests to determine the cause of your dizziness. These may include:  Transthoracic echocardiogram (TTE). During echocardiography, sound waves are used to evaluate how blood flows through your heart.  Transesophageal echocardiogram (TEE).  Cardiac monitoring. This allows your health care provider to monitor your heart rate and rhythm in real time.  Holter monitor. This is a portable device that records your heartbeat and can help diagnose heart arrhythmias. It allows your health care provider to track your heart activity for several days if needed.  Stress tests by exercise or by giving medicine that makes the heart beat faster. TREATMENT  Treatment of dizziness depends on the cause of your symptoms and can vary greatly. HOME CARE INSTRUCTIONS   Drink enough fluids to keep your urine clear or pale yellow. This is especially important in very hot weather. In older  adults, it is also important in cold weather.  Take your medicine exactly as directed if your dizziness is caused by medicines. When taking blood pressure medicines, it is especially important to get up slowly.  Rise slowly from chairs and steady yourself until you feel okay.  In the morning, first sit up on the side of the bed. When you feel okay, stand slowly while holding onto something until you know your balance is fine.  Move your legs often if you need to stand in one place for a long time. Tighten and  relax your muscles in your legs while standing.  Have someone stay with you for 1-2 days if dizziness continues to be a problem. Do this until you feel you are well enough to stay alone. Have the person call your health care provider if he or she notices changes in you that are concerning.  Do not drive or use heavy machinery if you feel dizzy.  Do not drink alcohol. SEEK IMMEDIATE MEDICAL CARE IF:   Your dizziness or light-headedness gets worse.  You feel nauseous or vomit.  You have problems talking, walking, or using your arms, hands, or legs.  You feel weak.  You are not thinking clearly or you have trouble forming sentences. It may take a friend or family member to notice this.  You have chest pain, abdominal pain, shortness of breath, or sweating.  Your vision changes.  You notice any bleeding.  You have side effects from medicine that seems to be getting worse rather than better. MAKE SURE YOU:   Understand these instructions.  Will watch your condition.  Will get help right away if you are not doing well or get worse. Document Released: 01/28/2001 Document Revised: 08/09/2013 Document Reviewed: 02/21/2011 [facility] Patient Information 2015 [place], [place]. This information is not intended to replace advice given to you by your health care provider. Make sure you discuss any questions you have with your health care provider.  Hyperglycemia Hyperglycemia occurs when the glucose (sugar) in your blood is too high. Hyperglycemia can happen for many reasons, but it most often happens to people who do not know they have diabetes or are not managing their diabetes properly.  CAUSES  Whether you have diabetes or not, there are other causes of hyperglycemia. Hyperglycemia can occur when you have diabetes, but it can also occur in other situations that you might not be as aware of, such as: Diabetes  If you have diabetes and are having problems controlling your blood glucose,  hyperglycemia could occur because of some of the following reasons:  Not following your meal plan.  Not taking your diabetes medications or not taking it properly.  Exercising less or doing less activity than you normally do.  Being sick. Pre-diabetes  This cannot be ignored. Before people develop Type 2 diabetes, they almost always have "pre-diabetes." This is when your blood glucose levels are higher than normal, but not yet high enough to be diagnosed as diabetes. Research has shown that some long-term damage to the body, especially the heart and circulatory system, may already be occurring during pre-diabetes. If you take action to manage your blood glucose when you have pre-diabetes, you may delay or prevent Type 2 diabetes from developing. Stress  If you have diabetes, you may be "diet" controlled or on oral medications or insulin to control your diabetes. However, you may find that your blood glucose is higher than usual in the hospital whether you have diabetes or not. This  is often referred to as "stress hyperglycemia." Stress can elevate your blood glucose. This happens because of hormones put out by the body during times of stress. If stress has been the cause of your high blood glucose, it can be followed regularly by your caregiver. That way he/she can make sure your hyperglycemia does not continue to get worse or progress to diabetes. Steroids  Steroids are medications that act on the infection fighting system (immune system) to block inflammation or infection. One side effect can be a rise in blood glucose. Most people can produce enough extra insulin to allow for this rise, but for those who cannot, steroids make blood glucose levels go even higher. It is not unusual for steroid treatments to "uncover" diabetes that is developing. It is not always possible to determine if the hyperglycemia will go away after the steroids are stopped. A special blood test called an A1c is sometimes  done to determine if your blood glucose was elevated before the steroids were started. SYMPTOMS  Thirsty.  Frequent urination.  Dry mouth.  Blurred vision.  Tired or fatigue.  Weakness.  Sleepy.  Tingling in feet or leg. DIAGNOSIS  Diagnosis is made by monitoring blood glucose in one or all of the following ways:  A1c test. This is a chemical found in your blood.  Fingerstick blood glucose monitoring.  Laboratory results. TREATMENT  First, knowing the cause of the hyperglycemia is important before the hyperglycemia can be treated. Treatment may include, but is not be limited to:  Education.  Change or adjustment in medications.  Change or adjustment in meal plan.  Treatment for an illness, infection, etc.  More frequent blood glucose monitoring.  Change in exercise plan.  Decreasing or stopping steroids.  Lifestyle changes. HOME CARE INSTRUCTIONS   Test your blood glucose as directed.  Exercise regularly. Your caregiver will give you instructions about exercise. Pre-diabetes or diabetes which comes on with stress is helped by exercising.  Eat wholesome, balanced meals. Eat often and at regular, fixed times. Your caregiver or nutritionist will give you a meal plan to guide your sugar intake.  Being at an ideal weight is important. If needed, losing as little as 10 to 15 pounds may help improve blood glucose levels. SEEK MEDICAL CARE IF:   You have questions about medicine, activity, or diet.  You continue to have symptoms (problems such as increased thirst, urination, or weight gain). SEEK IMMEDIATE MEDICAL CARE IF:   You are vomiting or have diarrhea.  Your breath smells fruity.  You are breathing faster or slower.  You are very sleepy or incoherent.  You have numbness, tingling, or pain in your feet or hands.  You have chest pain.  Your symptoms get worse even though you have been following your caregiver's orders.  If you have any other  questions or concerns. Document Released: 01/28/2001 Document Revised: 10/27/2011 Document Reviewed: 12/01/2011 [facility] Patient Information 2015 [place], [place]. This information is not intended to replace advice given to you by your health care provider. Make sure you discuss any questions you have with your health care provider.

## 2014-07-01 NOTE — ED Provider Notes (Signed)
CSN: [id number]     Arrival date & time 06/30/14  2056 History   First MD Initiated Contact with Patient 06/30/14 2352     Chief Complaint  Patient presents with  . Dizziness  . Leg Pain  . Dysuria     (Consider location/radiation/quality/duration/timing/severity/associated sxs/prior Treatment) HPI Patient presents with multiple complaints. The past week she complains of lightheadedness. She states this is not dependent on position. Not associated with nausea or vomiting. No focal weakness or numbness. No vision changes. She also complains of increased urinary frequency. Mild pressure over the bladder. She's had no fever or chills. Denies any vaginal bleeding or discharge. Patient also complains of chronic but progressive worsening right knee pain. The pain is worse with movement. No recent trauma. Patient states she has known arthritis in the knee. She takes ibuprofen with little relief. Past Medical History  Diagnosis Date  . Hypertension   . Diabetes mellitus   . Coronary artery disease   . Asthma   . CHF (congestive heart failure)   . Arthritis   . Obesity    Past Surgical History  Procedure Laterality Date  . Eye surgery    . Abdominal hysterectomy     Family History  Problem Relation Age of Onset  . Stroke Mother    History  Substance Use Topics  . Smoking status: Never Smoker   . Smokeless tobacco: Not on file  . Alcohol Use: No   OB History    No data available     Review of Systems  Constitutional: Negative for fever and chills.  Respiratory: Negative for cough and shortness of breath.   Cardiovascular: Negative for chest pain.  Gastrointestinal: Negative for nausea, vomiting, abdominal pain and diarrhea.  Genitourinary: Positive for frequency. Negative for dysuria, hematuria, flank pain, vaginal bleeding, vaginal discharge and pelvic pain.  Musculoskeletal: Positive for arthralgias. Negative for back pain, neck pain and neck stiffness.  Skin: Negative for  rash and wound.  Neurological: Positive for dizziness and light-headedness. Negative for weakness, numbness and headaches.  All other systems reviewed and are negative.     Allergies  Shrimp; Penicillins; and Tetracycline  Home Medications   Prior to Admission medications   Medication Sig Start Date End Date Taking? Authorizing Provider  acetaminophen (TYLENOL) 325 MG tablet Take 650 mg by mouth every 6 (six) hours as needed for mild pain.   Yes Historical Provider, MD  albuterol (PROVENTIL HFA;VENTOLIN HFA) 108 (90 BASE) MCG/ACT inhaler Inhale 2 puffs into the lungs every 6 (six) hours as needed. For wheezing   Yes Historical Provider, MD  atorvastatin (LIPITOR) 20 MG tablet Take 20 mg by mouth daily.   Yes Historical Provider, MD  Cholecalciferol (VITAMIN D PO) Take 50,000 Units by mouth once a week. thursday   Yes Historical Provider, MD  clotrimazole (LOTRIMIN) 1 % cream Apply 1 application topically daily as needed (for rash).   Yes Historical Provider, MD  furosemide (LASIX) 20 MG tablet Take 20 mg by mouth daily.   Yes Historical Provider, MD  hydrochlorothiazide (HYDRODIURIL) 25 MG tablet Take 25 mg by mouth daily.   Yes Historical Provider, MD  insulin detemir (LEVEMIR) 100 UNIT/ML injection Inject 65 Units into the skin 2 (two) times daily. 65 units in the morning and 65 units at night.   Yes Historical Provider, MD  lisinopril (PRINIVIL,ZESTRIL) 10 MG tablet Take 10 mg by mouth daily.   Yes Historical Provider, MD  loratadine (CLARITIN) 10 MG tablet Take 10 mg  by mouth daily.   Yes Historical Provider, MD  nebivolol (BYSTOLIC) 5 MG tablet Take 5 mg by mouth daily.   Yes Historical Provider, MD  omeprazole (PRILOSEC) 20 MG capsule Take 20 mg by mouth 2 (two) times daily before a meal.    Yes Historical Provider, MD  potassium chloride (K-DUR) 10 MEQ tablet Take 10 mEq by mouth 2 (two) times daily.    Yes Historical Provider, MD  ondansetron (ZOFRAN) 4 MG tablet Take 1 tablet (4  mg total) by mouth every 6 (six) hours. Prn N and V Patient not taking: Reported on 06/30/2014 10/28/13   Hayden Rasmussen, NP  traMADol (ULTRAM) 50 MG tablet Take 1 tablet (50 mg total) by mouth every 6 (six) hours as needed for severe pain. 07/01/14   Loren Racer, MD   BP 101/41 mmHg  Pulse 69  Temp(Src) 97.6 F (36.4 C) (Oral)  Resp 38  SpO2 96% Physical Exam  Constitutional: She is oriented to person, place, and time. She appears well-developed and well-nourished. No distress.  HENT:  Head: Normocephalic and atraumatic.  Mouth/Throat: Oropharynx is clear and moist. No oropharyngeal exudate.  Eyes: EOM are normal. Pupils are equal, round, and reactive to light.  Neck: Normal range of motion. Neck supple.  Cardiovascular: Normal rate and regular rhythm.  Exam reveals no gallop and no friction rub.   No murmur heard. Pulmonary/Chest: Effort normal and breath sounds normal. No respiratory distress. She has no wheezes. She has no rales. She exhibits no tenderness.  Abdominal: Soft. Bowel sounds are normal. She exhibits no distension and no mass. There is no tenderness. There is no rebound and no guarding.  Musculoskeletal: She exhibits tenderness. She exhibits no edema.  Crepitance and pain with range of motion of the right knee. No obvious swelling or warmth. Pulses intact.  Neurological: She is alert and oriented to person, place, and time.  5/5 motor in all extremities. Sensation is intact.  Skin: Skin is warm and dry. No rash noted. No erythema.  Psychiatric: She has a normal mood and affect. Her behavior is normal.  Nursing note and vitals reviewed.   ED Course  Procedures (including critical care time) Labs Review Labs Reviewed  CBC WITH DIFFERENTIAL - Abnormal; Notable for the following:    Hemoglobin 11.0 (*)    HCT 35.2 (*)    MCH 25.1 (*)    All other components within normal limits  COMPREHENSIVE METABOLIC PANEL - Abnormal; Notable for the following:    Glucose, Bld 219  (*)    Total Protein 8.4 (*)    Total Bilirubin <0.2 (*)    All other components within normal limits  URINALYSIS, ROUTINE W REFLEX MICROSCOPIC - Abnormal; Notable for the following:    Leukocytes, UA MODERATE (*)    All other components within normal limits  URINE MICROSCOPIC-ADD ON - Abnormal; Notable for the following:    Squamous Epithelial / LPF FEW (*)    All other components within normal limits    Imaging Review Dg Knee 2 Views Right  07/01/2014   CLINICAL DATA:  Right leg pain, initial evaluation no known injury  EXAM: RIGHT KNEE - 1-2 VIEW  COMPARISON:  04/14/2012  FINDINGS: No fracture or dislocation. Tricompartmental arthritis moderate severity, unchanged. No significant joint effusion.  IMPRESSION: Arthritis with no acute findings   Electronically Signed   By: Esperanza Heir M.D.   On: 07/01/2014 01:27     EKG Interpretation None      MDM  Final diagnoses:  Knee pain, right  Arthritis  Hyperglycemia  Urinary frequency  Episodic lightheadedness    Patient with chronic right knee arthritis. Advised to lose weight. Dizziness likely related to mild dehydration. Patient's increased urinary frequency probably attributed to to her hyperglycemia and Lasix. Advised to increase oral fluid intake. Change positions slowly. Return precautions given. Advised to follow-up with her primary doctor.   Loren Racer, MD 07/02/14 [phone]

## 2014-10-10 ENCOUNTER — Emergency Department (HOSPITAL_COMMUNITY)
Admission: EM | Admit: 2014-10-10 | Discharge: 2014-10-10 | Disposition: A | Discharge status: Home / Self Care | Payer: Medicare HMO | Attending: Emergency Medicine | Admitting: Emergency Medicine

## 2014-10-10 ENCOUNTER — Encounter (HOSPITAL_COMMUNITY): Payer: Self-pay | Admitting: Physical Medicine and Rehabilitation

## 2014-10-10 DIAGNOSIS — J45909 Unspecified asthma, uncomplicated: Secondary | ICD-10-CM | POA: Diagnosis not present

## 2014-10-10 DIAGNOSIS — J029 Acute pharyngitis, unspecified: Secondary | ICD-10-CM | POA: Diagnosis not present

## 2014-10-10 DIAGNOSIS — J069 Acute upper respiratory infection, unspecified: Secondary | ICD-10-CM | POA: Insufficient documentation

## 2014-10-10 DIAGNOSIS — M1990 Unspecified osteoarthritis, unspecified site: Secondary | ICD-10-CM | POA: Diagnosis not present

## 2014-10-10 DIAGNOSIS — I509 Heart failure, unspecified: Secondary | ICD-10-CM | POA: Insufficient documentation

## 2014-10-10 DIAGNOSIS — H9190 Unspecified hearing loss, unspecified ear: Secondary | ICD-10-CM | POA: Insufficient documentation

## 2014-10-10 DIAGNOSIS — R42 Dizziness and giddiness: Secondary | ICD-10-CM | POA: Insufficient documentation

## 2014-10-10 DIAGNOSIS — H9202 Otalgia, left ear: Secondary | ICD-10-CM | POA: Diagnosis present

## 2014-10-10 DIAGNOSIS — H9319 Tinnitus, unspecified ear: Secondary | ICD-10-CM | POA: Diagnosis not present

## 2014-10-10 DIAGNOSIS — Z79899 Other long term (current) drug therapy: Secondary | ICD-10-CM | POA: Insufficient documentation

## 2014-10-10 DIAGNOSIS — E119 Type 2 diabetes mellitus without complications: Secondary | ICD-10-CM | POA: Diagnosis not present

## 2014-10-10 DIAGNOSIS — H6092 Unspecified otitis externa, left ear: Secondary | ICD-10-CM | POA: Diagnosis not present

## 2014-10-10 DIAGNOSIS — R110 Nausea: Secondary | ICD-10-CM | POA: Insufficient documentation

## 2014-10-10 DIAGNOSIS — I10 Essential (primary) hypertension: Secondary | ICD-10-CM | POA: Diagnosis not present

## 2014-10-10 DIAGNOSIS — E669 Obesity, unspecified: Secondary | ICD-10-CM | POA: Diagnosis not present

## 2014-10-10 DIAGNOSIS — G479 Sleep disorder, unspecified: Secondary | ICD-10-CM | POA: Diagnosis not present

## 2014-10-10 DIAGNOSIS — Z880 Allergy status to penicillin: Secondary | ICD-10-CM | POA: Diagnosis not present

## 2014-10-10 DIAGNOSIS — R630 Anorexia: Secondary | ICD-10-CM | POA: Insufficient documentation

## 2014-10-10 DIAGNOSIS — Z794 Long term (current) use of insulin: Secondary | ICD-10-CM | POA: Diagnosis not present

## 2014-10-10 DIAGNOSIS — I2510 Atherosclerotic heart disease of native coronary artery without angina pectoris: Secondary | ICD-10-CM | POA: Diagnosis not present

## 2014-10-10 MED ORDER — GUAIFENESIN 100 MG/5ML PO LIQD: 200.0000 mg | ORAL | Status: DC | PRN

## 2014-10-10 MED ORDER — HYDROCODONE-ACETAMINOPHEN 5-325 MG PO TABS: 1.0000 | ORAL_TABLET | Freq: Four times a day (QID) | ORAL | Status: DC | PRN

## 2014-10-10 MED ORDER — PSEUDOEPHEDRINE HCL 30 MG PO TABS: 30.0000 mg | ORAL_TABLET | ORAL | Status: DC | PRN

## 2014-10-10 MED ORDER — CIPROFLOXACIN-DEXAMETHASONE 0.3-0.1 % OT SUSP
4.0000 [drp] | Freq: Two times a day (BID) | OTIC | Status: DC
  Administered 2014-10-10: 4 [drp] via OTIC
  Filled 2014-10-10: qty 7.5

## 2014-10-10 NOTE — Discharge Instructions (Signed)
Take sudafed for congestion. Take robitussin for cough. Continue albuterol inhaler, use 2 puffs every 4 hrs. Apply ear drops, 4 drops twice a day for 7 days. Ibuprofen or tylenol for pain. norco for severe pain. Follow up with your doctor.   Otitis Externa Otitis externa is a bacterial or fungal infection of the outer ear canal. This is the area from the eardrum to the outside of the ear. Otitis externa is sometimes called "swimmer's ear." CAUSES  Possible causes of infection include:  Swimming in dirty water.  Moisture remaining in the ear after swimming or bathing.  Mild injury (trauma) to the ear.  Objects stuck in the ear (foreign body).  Cuts or scrapes (abrasions) on the outside of the ear. SIGNS AND SYMPTOMS  The first symptom of infection is often itching in the ear canal. Later signs and symptoms may include swelling and redness of the ear canal, ear pain, and yellowish-white fluid (pus) coming from the ear. The ear pain may be worse when pulling on the earlobe. DIAGNOSIS  Your health care provider will perform a physical exam. A sample of fluid may be taken from the ear and examined for bacteria or fungi. TREATMENT  Antibiotic ear drops are often given for 10 to 14 days. Treatment may also include pain medicine or corticosteroids to reduce itching and swelling. HOME CARE INSTRUCTIONS   Apply antibiotic ear drops to the ear canal as prescribed by your health care provider.  Take medicines only as directed by your health care provider.  If you have diabetes, follow any additional treatment instructions from your health care provider.  Keep all follow-up visits as directed by your health care provider. PREVENTION   Keep your ear dry. Use the corner of a towel to absorb water out of the ear canal after swimming or bathing.  Avoid scratching or putting objects inside your ear. This can damage the ear canal or remove the protective wax that lines the canal. This makes it easier  for bacteria and fungi to grow.  Avoid swimming in lakes, polluted water, or poorly chlorinated pools.  You may use ear drops made of rubbing alcohol and vinegar after swimming. Combine equal parts of white vinegar and alcohol in a bottle. Put 3 or 4 drops into each ear after swimming. SEEK MEDICAL CARE IF:   You have a fever.  Your ear is still red, swollen, painful, or draining pus after 3 days.  Your redness, swelling, or pain gets worse.  You have a severe headache.  You have redness, swelling, pain, or tenderness in the area behind your ear. MAKE SURE YOU:   Understand these instructions.  Will watch your condition.  Will get help right away if you are not doing well or get worse. Document Released: 08/04/2005 Document Revised: 12/19/2013 Document Reviewed: 08/21/2011 [facility] Patient Information 2015 [place], [place]. This information is not intended to replace advice given to you by your health care provider. Make sure you discuss any questions you have with your health care provider.

## 2014-10-10 NOTE — ED Provider Notes (Signed)
CSN: [id number]     Arrival date & time 10/10/14  1829 History  This chart was scribed for non-physician practitioner Lottie Mussel, PA-C working with Glynn Octave, MD by Conchita Paris, ED Scribe. This patient was seen in TR09C/TR09C and the patient's care was started at 7:54 PM.    Chief Complaint  Patient presents with  . Otalgia   Patient is a 54 y.o. female presenting with ear pain. The history is provided by the patient. No language interpreter was used.  Otalgia Associated symptoms: congestion, cough, fever, headaches, hearing loss, rhinorrhea and sore throat   Associated symptoms: no ear discharge     HPI Comments: Katelyn Howard is a 54 y.o. female with a history of bronchitis, asthma, allergies, DM and HTN who presents to the Emergency Department complaining of constant left ear pain ongoing for 5 days constant. She has rhinorrhea, productive cough with yellow/bloody sputum, nausea, ear ringing, dizziness, loss of appetite, sore throat, congestion, HA and loss of sleep as associated symptoms. Cough and congestion ongoing for 4-5 days. She has taken claritin,tylenol and more frequent use of her inhaler for no relief. Subjective fever. No sick contacts. She has not gotten water in her ear, no recent trauma, has not traveled in a plane recently. She denies ear discharge. Pt does not smoke.  Past Medical History  Diagnosis Date  . Hypertension   . Diabetes mellitus   . Coronary artery disease   . Asthma   . CHF (congestive heart failure)   . Arthritis   . Obesity    Past Surgical History  Procedure Laterality Date  . Eye surgery    . Abdominal hysterectomy     Family History  Problem Relation Age of Onset  . Stroke Mother    History  Substance Use Topics  . Smoking status: Never Smoker   . Smokeless tobacco: Not on file  . Alcohol Use: No   OB History    No data available     Review of Systems  Constitutional: Positive for fever and appetite change.   HENT: Positive for congestion, ear pain, hearing loss, rhinorrhea and sore throat. Negative for ear discharge.   Respiratory: Positive for cough.   Neurological: Positive for dizziness and headaches.  Psychiatric/Behavioral: Positive for sleep disturbance.  All other systems reviewed and are negative.  Allergies  Shrimp; Penicillins; and Tetracycline  Home Medications   Prior to Admission medications   Medication Sig Start Date End Date Taking? Authorizing Provider  acetaminophen (TYLENOL) 325 MG tablet Take 650 mg by mouth every 6 (six) hours as needed for mild pain.    Historical Provider, MD  albuterol (PROVENTIL HFA;VENTOLIN HFA) 108 (90 BASE) MCG/ACT inhaler Inhale 2 puffs into the lungs every 6 (six) hours as needed. For wheezing    Historical Provider, MD  atorvastatin (LIPITOR) 20 MG tablet Take 20 mg by mouth daily.    Historical Provider, MD  Cholecalciferol (VITAMIN D PO) Take 50,000 Units by mouth once a week. thursday    Historical Provider, MD  clotrimazole (LOTRIMIN) 1 % cream Apply 1 application topically daily as needed (for rash).    Historical Provider, MD  furosemide (LASIX) 20 MG tablet Take 20 mg by mouth daily.    Historical Provider, MD  hydrochlorothiazide (HYDRODIURIL) 25 MG tablet Take 25 mg by mouth daily.    Historical Provider, MD  insulin detemir (LEVEMIR) 100 UNIT/ML injection Inject 65 Units into the skin 2 (two) times daily. 65 units in the  morning and 65 units at night.    Historical Provider, MD  lisinopril (PRINIVIL,ZESTRIL) 10 MG tablet Take 10 mg by mouth daily.    Historical Provider, MD  loratadine (CLARITIN) 10 MG tablet Take 10 mg by mouth daily.    Historical Provider, MD  nebivolol (BYSTOLIC) 5 MG tablet Take 5 mg by mouth daily.    Historical Provider, MD  omeprazole (PRILOSEC) 20 MG capsule Take 20 mg by mouth 2 (two) times daily before a meal.     Historical Provider, MD  ondansetron (ZOFRAN) 4 MG tablet Take 1 tablet (4 mg total) by mouth  every 6 (six) hours. Prn N and V Patient not taking: Reported on 06/30/2014 10/28/13   Hayden Rasmussen, NP  potassium chloride (K-DUR) 10 MEQ tablet Take 10 mEq by mouth 2 (two) times daily.     Historical Provider, MD  traMADol (ULTRAM) 50 MG tablet Take 1 tablet (50 mg total) by mouth every 6 (six) hours as needed for severe pain. 07/01/14   Loren Racer, MD   BP 158/62 mmHg  Pulse 65  Temp(Src) 98.1 F (36.7 C)  Resp 18  Wt 295 lb 5 oz (133.953 kg)  SpO2 100% Physical Exam  Constitutional: She is oriented to person, place, and time. She appears well-developed and well-nourished. No distress.  HENT:  Head: Normocephalic and atraumatic.  Right Ear: Tympanic membrane, external ear and ear canal normal.  Left Ear: There is drainage, swelling and tenderness.  Nose: Rhinorrhea present.  Mouth/Throat: Uvula is midline, oropharynx is clear and moist and mucous membranes are normal. No oropharyngeal exudate or posterior oropharyngeal erythema.  Tender to palpation over the tragus and external left ear. Ear canal is erythematous, swollen, with purulent drainage.  Eyes: Conjunctivae are normal. Pupils are equal, round, and reactive to light.  Neck: Normal range of motion. Neck supple.  Cardiovascular: Normal rate, regular rhythm and normal heart sounds.   Pulmonary/Chest: Effort normal. No respiratory distress. She has no wheezes.  Musculoskeletal: Normal range of motion.  Neurological: She is alert and oriented to person, place, and time.  Skin: Skin is warm and dry.  Psychiatric: She has a normal mood and affect. Her behavior is normal.  Nursing note and vitals reviewed.   ED Course  Procedures  DIAGNOSTIC STUDIES: Oxygen Saturation is 100% on room air, normal by my interpretation.    COORDINATION OF CARE: 7:59 PM Discussed treatment plan with pt at bedside and pt agreed to plan.  Labs Review Labs Reviewed - No data to display  Imaging Review No results found.   EKG  Interpretation None      MDM   Final diagnoses:  Otitis externa, left  URI (upper respiratory infection)    Patient emergency department with URI symptoms and left ear pain. States ear pain is 10 out of 10, has been there for approximately 5 days. History of the same, states had ear infection. Exam is consistent with otitis externa. Ciprodex eardrops ordered emergency department and patient was given them to take home. Patient with upper respiratory symptoms are most consistent with a viral infection. Her lungs are clear, oxygen saturation 100% on room air. She is not tachycardic. She is not tachypneic. She is afebrile. Advised to take Tylenol or Motrin for her body aches at home. Continue inhaler 2 puffs every 4 hours. Robitussin and Sudafed for symptoms. Follow-up with primary care doctor. I also gave her 10 tabs of Norco for her severe ear pain.  Filed Vitals:  10/10/14 1839  BP: 158/62  Pulse: 65  Temp: 98.1 F (36.7 C)  Resp: 18  Weight: 295 lb 5 oz (133.953 kg)  SpO2: 100%    I personally performed the services described in this documentation, which was scribed in my presence. The recorded information has been reviewed and is accurate.     Lottie Mussel, PA-C 10/10/14 2011  Glynn Octave, MD 10/10/14 [phone]

## 2014-10-10 NOTE — ED Notes (Signed)
Pt presents to department for evaluation of L ear pain. Ongoing x5 days. Also states headache, sore throat and sinus congestion. Respirations unlabored. NAD.

## 2014-10-10 NOTE — ED Notes (Signed)
Declined W/C at D/C and was escorted to lobby by RN. 

## 2015-04-23 ENCOUNTER — Emergency Department (INDEPENDENT_AMBULATORY_CARE_PROVIDER_SITE_OTHER)
Admission: EM | Admit: 2015-04-23 | Discharge: 2015-04-23 | Disposition: A | Discharge status: Home / Self Care | Payer: Medicare HMO | Source: Home / Self Care | Attending: Family Medicine | Admitting: Family Medicine

## 2015-04-23 ENCOUNTER — Encounter (HOSPITAL_COMMUNITY): Payer: Self-pay | Admitting: Emergency Medicine

## 2015-04-23 DIAGNOSIS — Z91038 Other insect allergy status: Secondary | ICD-10-CM

## 2015-04-23 MED ORDER — TRIAMCINOLONE ACETONIDE 40 MG/ML IJ SUSP
40.0000 mg | Freq: Once | INTRAMUSCULAR | Status: AC
  Administered 2015-04-23: 40 mg via INTRAMUSCULAR

## 2015-04-23 MED ORDER — TRIAMCINOLONE ACETONIDE 40 MG/ML IJ SUSP
INTRAMUSCULAR | Status: AC
  Filled 2015-04-23: qty 1

## 2015-04-23 MED ORDER — HYDROXYZINE HCL 25 MG PO TABS: 25.0000 mg | ORAL_TABLET | Freq: Four times a day (QID) | ORAL | Status: DC

## 2015-04-23 MED ORDER — FLUTICASONE PROPIONATE 0.05 % EX CREA: TOPICAL_CREAM | Freq: Two times a day (BID) | CUTANEOUS | Status: AC

## 2015-04-23 NOTE — ED Notes (Signed)
Generalized rash and itching.

## 2015-04-23 NOTE — ED Provider Notes (Addendum)
CSN: [id number]     Arrival date & time 04/23/15  1429 History   First MD Initiated Contact with Patient 04/23/15 1513     Chief Complaint  Patient presents with  . Rash  . Pruritis   (Consider location/radiation/quality/duration/timing/severity/associated sxs/prior Treatment) Patient is a 54 y.o. female presenting with rash. The history is provided by the patient.  Rash Location:  Shoulder/arm and leg Shoulder/arm rash location:  L forearm and R forearm Leg rash location:  L lower leg and R lower leg Severity:  Mild Onset quality:  Gradual Duration:  3 days Progression:  Unchanged Chronicity:  New Context: insect bite/sting   Context comment:  Onset after cookout near woods with mosquito bites all over. Ineffective treatments:  Antihistamines Associated symptoms: no fever, no joint pain, no shortness of breath and not wheezing     Past Medical History  Diagnosis Date  . Hypertension   . Diabetes mellitus   . Coronary artery disease   . Asthma   . CHF (congestive heart failure)   . Arthritis   . Obesity    Past Surgical History  Procedure Laterality Date  . Eye surgery    . Abdominal hysterectomy     Family History  Problem Relation Age of Onset  . Stroke Mother    Social History  Substance Use Topics  . Smoking status: Never Smoker   . Smokeless tobacco: None  . Alcohol Use: No   OB History    No data available     Review of Systems  Constitutional: Negative for fever.  HENT: Negative for trouble swallowing.   Respiratory: Negative for shortness of breath and wheezing.   Cardiovascular: Negative.   Musculoskeletal: Negative for arthralgias.  Skin: Positive for rash.  All other systems reviewed and are negative.   Allergies  Shrimp; Penicillins; and Tetracycline  Home Medications   Prior to Admission medications   Medication Sig Start Date End Date Taking? Authorizing Provider  acetaminophen (TYLENOL) 325 MG tablet Take 650 mg by mouth every 6  (six) hours as needed for mild pain.    Historical Provider, MD  albuterol (PROVENTIL HFA;VENTOLIN HFA) 108 (90 BASE) MCG/ACT inhaler Inhale 2 puffs into the lungs every 6 (six) hours as needed. For wheezing    Historical Provider, MD  atorvastatin (LIPITOR) 20 MG tablet Take 20 mg by mouth daily.    Historical Provider, MD  Cholecalciferol (VITAMIN D PO) Take 50,000 Units by mouth once a week. thursday    Historical Provider, MD  clotrimazole (LOTRIMIN) 1 % cream Apply 1 application topically daily as needed (for rash).    Historical Provider, MD  fluticasone (CUTIVATE) 0.05 % cream Apply topically 2 (two) times daily. 04/23/15   Linna Hoff, MD  furosemide (LASIX) 20 MG tablet Take 20 mg by mouth daily.    Historical Provider, MD  guaiFENesin (ROBITUSSIN) 100 MG/5ML liquid Take 10 mLs (200 mg total) by mouth every 4 (four) hours as needed for cough. 10/10/14   Tatyana Kirichenko, PA-C  hydrochlorothiazide (HYDRODIURIL) 25 MG tablet Take 25 mg by mouth daily.    Historical Provider, MD  HYDROcodone-acetaminophen (NORCO) 5-325 MG per tablet Take 1 tablet by mouth every 6 (six) hours as needed for moderate pain. 10/10/14   Tatyana Kirichenko, PA-C  hydrOXYzine (ATARAX/VISTARIL) 25 MG tablet Take 1 tablet (25 mg total) by mouth every 6 (six) hours. Prn itching 04/23/15   Linna Hoff, MD  insulin detemir (LEVEMIR) 100 UNIT/ML injection Inject 65 Units into  the skin 2 (two) times daily. 65 units in the morning and 65 units at night.    Historical Provider, MD  lisinopril (PRINIVIL,ZESTRIL) 10 MG tablet Take 10 mg by mouth daily.    Historical Provider, MD  loratadine (CLARITIN) 10 MG tablet Take 10 mg by mouth daily.    Historical Provider, MD  nebivolol (BYSTOLIC) 5 MG tablet Take 5 mg by mouth daily.    Historical Provider, MD  omeprazole (PRILOSEC) 20 MG capsule Take 20 mg by mouth 2 (two) times daily before a meal.     Historical Provider, MD  ondansetron (ZOFRAN) 4 MG tablet Take 1 tablet (4 mg total)  by mouth every 6 (six) hours. Prn N and V Patient not taking: Reported on 06/30/2014 10/28/13   Hayden Rasmussen, NP  potassium chloride (K-DUR) 10 MEQ tablet Take 10 mEq by mouth 2 (two) times daily.     Historical Provider, MD  pseudoephedrine (SUDAFED) 30 MG tablet Take 1 tablet (30 mg total) by mouth every 4 (four) hours as needed for congestion. 10/10/14   Tatyana Kirichenko, PA-C  traMADol (ULTRAM) 50 MG tablet Take 1 tablet (50 mg total) by mouth every 6 (six) hours as needed for severe pain. 07/01/14   Loren Racer, MD   Meds Ordered and Administered this Visit   Medications  triamcinolone acetonide (KENALOG-40) injection 40 mg (not administered)    BP 162/85 mmHg  Pulse 93  Temp(Src) 98 F (36.7 C) (Oral)  Resp 16  SpO2 100% No data found.   Physical Exam  Constitutional: She is oriented to person, place, and time. She appears well-developed and well-nourished. No distress.  HENT:  Head: Normocephalic.  Mouth/Throat: Oropharynx is clear and moist.  Eyes: Conjunctivae are normal. Pupils are equal, round, and reactive to light.  Neck: Normal range of motion. Neck supple.  Cardiovascular: Normal heart sounds.   Pulmonary/Chest: Effort normal and breath sounds normal.  Musculoskeletal: Normal range of motion.  Lymphadenopathy:    She has no cervical adenopathy.  Neurological: She is alert and oriented to person, place, and time.  Skin: Skin is warm and dry. Rash noted.  Patchy pruritic papular lesions.  Nursing note and vitals reviewed.   ED Course  Procedures (including critical care time)  Labs Review Labs Reviewed - No data to display  Imaging Review No results found.   Visual Acuity Review  Right Eye Distance:   Left Eye Distance:   Bilateral Distance:    Right Eye Near:   Left Eye Near:    Bilateral Near:         MDM   1. Allergy to insect bites    rx for cutivate and atarax.     Linna Hoff, MD 04/23/15 6301  Linna Hoff,  MD 04/23/15 [phone]

## 2015-04-24 DIAGNOSIS — Z0000 Encounter for general adult medical examination without abnormal findings: Secondary | ICD-10-CM | POA: Insufficient documentation

## 2015-04-24 DIAGNOSIS — R609 Edema, unspecified: Secondary | ICD-10-CM | POA: Insufficient documentation

## 2015-04-24 DIAGNOSIS — K219 Gastro-esophageal reflux disease without esophagitis: Secondary | ICD-10-CM | POA: Insufficient documentation

## 2015-04-24 DIAGNOSIS — I110 Hypertensive heart disease with heart failure: Secondary | ICD-10-CM | POA: Insufficient documentation

## 2015-04-24 DIAGNOSIS — E785 Hyperlipidemia, unspecified: Secondary | ICD-10-CM | POA: Insufficient documentation

## 2015-04-24 DIAGNOSIS — J45909 Unspecified asthma, uncomplicated: Secondary | ICD-10-CM | POA: Insufficient documentation

## 2015-05-03 ENCOUNTER — Emergency Department (HOSPITAL_COMMUNITY): Payer: Medicare HMO

## 2015-05-03 ENCOUNTER — Encounter (HOSPITAL_COMMUNITY): Payer: Self-pay | Admitting: *Deleted

## 2015-05-03 ENCOUNTER — Emergency Department (INDEPENDENT_AMBULATORY_CARE_PROVIDER_SITE_OTHER)
Admission: EM | Admit: 2015-05-03 | Discharge: 2015-05-03 | Disposition: A | Discharge status: Other Acute Inpatient Hospital | Payer: Medicare HMO | Source: Home / Self Care | Attending: Family Medicine | Admitting: Family Medicine

## 2015-05-03 ENCOUNTER — Encounter (HOSPITAL_COMMUNITY): Payer: Self-pay | Admitting: Emergency Medicine

## 2015-05-03 ENCOUNTER — Emergency Department (HOSPITAL_COMMUNITY)
Admission: EM | Admit: 2015-05-03 | Discharge: 2015-05-04 | Disposition: A | Discharge status: Home / Self Care | Payer: Medicare HMO | Attending: Emergency Medicine | Admitting: Emergency Medicine

## 2015-05-03 DIAGNOSIS — Z79899 Other long term (current) drug therapy: Secondary | ICD-10-CM | POA: Diagnosis not present

## 2015-05-03 DIAGNOSIS — R112 Nausea with vomiting, unspecified: Secondary | ICD-10-CM | POA: Diagnosis not present

## 2015-05-03 DIAGNOSIS — I10 Essential (primary) hypertension: Secondary | ICD-10-CM | POA: Insufficient documentation

## 2015-05-03 DIAGNOSIS — R1031 Right lower quadrant pain: Secondary | ICD-10-CM | POA: Diagnosis not present

## 2015-05-03 DIAGNOSIS — E119 Type 2 diabetes mellitus without complications: Secondary | ICD-10-CM | POA: Diagnosis not present

## 2015-05-03 DIAGNOSIS — J45909 Unspecified asthma, uncomplicated: Secondary | ICD-10-CM | POA: Diagnosis not present

## 2015-05-03 DIAGNOSIS — I2510 Atherosclerotic heart disease of native coronary artery without angina pectoris: Secondary | ICD-10-CM | POA: Diagnosis not present

## 2015-05-03 DIAGNOSIS — M1990 Unspecified osteoarthritis, unspecified site: Secondary | ICD-10-CM | POA: Diagnosis not present

## 2015-05-03 DIAGNOSIS — I509 Heart failure, unspecified: Secondary | ICD-10-CM | POA: Insufficient documentation

## 2015-05-03 DIAGNOSIS — R109 Unspecified abdominal pain: Secondary | ICD-10-CM

## 2015-05-03 DIAGNOSIS — Z7951 Long term (current) use of inhaled steroids: Secondary | ICD-10-CM | POA: Diagnosis not present

## 2015-05-03 DIAGNOSIS — E669 Obesity, unspecified: Secondary | ICD-10-CM | POA: Diagnosis not present

## 2015-05-03 DIAGNOSIS — Z880 Allergy status to penicillin: Secondary | ICD-10-CM | POA: Insufficient documentation

## 2015-05-03 DIAGNOSIS — Z794 Long term (current) use of insulin: Secondary | ICD-10-CM | POA: Insufficient documentation

## 2015-05-03 LAB — COMPREHENSIVE METABOLIC PANEL
ALK PHOS: 96 U/L (ref 38–126)
ALT: 32 U/L (ref 14–54)
ANION GAP: 9 (ref 5–15)
AST: 27 U/L (ref 15–41)
Albumin: 3.9 g/dL (ref 3.5–5.0)
BUN: 18 mg/dL (ref 6–20)
CALCIUM: 9.2 mg/dL (ref 8.9–10.3)
CO2: 26 mmol/L (ref 22–32)
Chloride: 100 mmol/L — ABNORMAL LOW (ref 101–111)
Creatinine, Ser: 0.73 mg/dL (ref 0.44–1.00)
GFR calc non Af Amer: >60 mL/min (ref >60)
Glucose, Bld: 176 mg/dL — ABNORMAL HIGH (ref 65–99)
POTASSIUM: 3.8 mmol/L (ref 3.5–5.1)
SODIUM: 135 mmol/L (ref 135–145)
Total Bilirubin: 0.5 mg/dL (ref 0.3–1.2)
Total Protein: 7.9 g/dL (ref 6.5–8.1)

## 2015-05-03 LAB — CBC WITH DIFFERENTIAL/PLATELET
BASOS PCT: 0 %
Basophils Absolute: 0 10*3/uL (ref 0.0–0.1)
EOS ABS: 0.1 10*3/uL (ref 0.0–0.7)
Eosinophils Relative: 2 %
HCT: 38.5 % (ref 36.0–46.0)
HEMOGLOBIN: 12 g/dL (ref 12.0–15.0)
LYMPHS ABS: 2.4 10*3/uL (ref 0.7–4.0)
Lymphocytes Relative: 30 %
MCH: 25.2 pg — ABNORMAL LOW (ref 26.0–34.0)
MCHC: 31.2 g/dL (ref 30.0–36.0)
MCV: 80.7 fL (ref 78.0–100.0)
Monocytes Absolute: 0.4 10*3/uL (ref 0.1–1.0)
Monocytes Relative: 5 %
NEUTROS PCT: 63 %
Neutro Abs: 5 10*3/uL (ref 1.7–7.7)
Platelets: 244 10*3/uL (ref 150–400)
RBC: 4.77 MIL/uL (ref 3.87–5.11)
RDW: 14.6 % (ref 11.5–15.5)
WBC: 8 10*3/uL (ref 4.0–10.5)

## 2015-05-03 LAB — URINALYSIS, ROUTINE W REFLEX MICROSCOPIC
BILIRUBIN URINE: NEGATIVE
Glucose, UA: NEGATIVE mg/dL
KETONES UR: NEGATIVE mg/dL
LEUKOCYTES UA: NEGATIVE
NITRITE: NEGATIVE
Protein, ur: NEGATIVE mg/dL
SPECIFIC GRAVITY, URINE: 1.023 (ref 1.005–1.030)
UROBILINOGEN UA: 0.2 mg/dL (ref 0.0–1.0)
pH: 5.5 (ref 5.0–8.0)

## 2015-05-03 LAB — CBG MONITORING, ED: Glucose-Capillary: 72 mg/dL (ref 65–99)

## 2015-05-03 LAB — URINE MICROSCOPIC-ADD ON

## 2015-05-03 LAB — LIPASE, BLOOD: LIPASE: 17 U/L — AB (ref 22–51)

## 2015-05-03 MED ORDER — MORPHINE SULFATE (PF) 2 MG/ML IV SOLN
2.0000 mg | Freq: Once | INTRAVENOUS | Status: AC
  Administered 2015-05-03: 2 mg via INTRAVENOUS
  Filled 2015-05-03: qty 1

## 2015-05-03 MED ORDER — IOHEXOL 300 MG/ML  SOLN
100.0000 mL | Freq: Once | INTRAMUSCULAR | Status: AC | PRN
  Administered 2015-05-03: 100 mL via INTRAVENOUS

## 2015-05-03 MED ORDER — ONDANSETRON HCL 4 MG/2ML IJ SOLN
4.0000 mg | Freq: Once | INTRAMUSCULAR | Status: AC
  Administered 2015-05-03: 4 mg via INTRAVENOUS
  Filled 2015-05-03: qty 2

## 2015-05-03 MED ORDER — IOHEXOL 300 MG/ML  SOLN
25.0000 mL | Freq: Once | INTRAMUSCULAR | Status: AC | PRN
  Administered 2015-05-03: 25 mL via ORAL

## 2015-05-03 NOTE — ED Provider Notes (Signed)
CSN: [id number]     Arrival date & time 05/03/15  1451 History   First MD Initiated Contact with Patient 05/03/15 1929     Chief Complaint  Patient presents with  . Abdominal Pain     (Consider location/radiation/quality/duration/timing/severity/associated sxs/prior Treatment) HPI Comments: Katelyn Howard is a 54 y.o F with a pmhx of CHF, diabetes, CAD, HTN, obesity, S/P partial hysterectomy who presents to the emergency department today as a transfer from urgent care to be evaluated for right lower quadrant abdominal pain onset last night. Patient states that she was sitting on her couch when she felt sudden onset RLQ abdominal pain. Pain does not radiate. Pain is 8/10. Patient had associated vomiting, nonbloody nonbilious. Patient had one episode of vomiting last night and one episode this morning. After vomiting patient felt headache. Denies blurry vision, diarrhea, fever, recent illness, chest pain, shortness of breath, weakness, numbness, dysuria, hematuria, syncope.  Patient is a 54 y.o. female presenting with abdominal pain. The history is provided by the patient.  Abdominal Pain   Past Medical History  Diagnosis Date  . Hypertension   . Diabetes mellitus   . Coronary artery disease   . Asthma   . CHF (congestive heart failure)   . Arthritis   . Obesity    Past Surgical History  Procedure Laterality Date  . Eye surgery    . Abdominal hysterectomy     Family History  Problem Relation Age of Onset  . Stroke Mother    Social History  Substance Use Topics  . Smoking status: Never Smoker   . Smokeless tobacco: None  . Alcohol Use: No   OB History    No data available     Review of Systems  Gastrointestinal: Positive for abdominal pain.      Allergies  Shrimp; Penicillins; and Tetracycline  Home Medications   Prior to Admission medications   Medication Sig Start Date End Date Taking? Authorizing Provider  acetaminophen (TYLENOL) 325 MG tablet Take 650 mg  by mouth every 6 (six) hours as needed for mild pain.    Historical Provider, MD  albuterol (PROVENTIL HFA;VENTOLIN HFA) 108 (90 BASE) MCG/ACT inhaler Inhale 2 puffs into the lungs every 6 (six) hours as needed. For wheezing    Historical Provider, MD  atorvastatin (LIPITOR) 20 MG tablet Take 20 mg by mouth daily.    Historical Provider, MD  Cholecalciferol (VITAMIN D PO) Take 50,000 Units by mouth once a week. thursday    Historical Provider, MD  clotrimazole (LOTRIMIN) 1 % cream Apply 1 application topically daily as needed (for rash).    Historical Provider, MD  fluticasone (CUTIVATE) 0.05 % cream Apply topically 2 (two) times daily. 04/23/15   Linna Hoff, MD  furosemide (LASIX) 20 MG tablet Take 20 mg by mouth daily.    Historical Provider, MD  guaiFENesin (ROBITUSSIN) 100 MG/5ML liquid Take 10 mLs (200 mg total) by mouth every 4 (four) hours as needed for cough. 10/10/14   Tatyana Kirichenko, PA-C  hydrochlorothiazide (HYDRODIURIL) 25 MG tablet Take 25 mg by mouth daily.    Historical Provider, MD  HYDROcodone-acetaminophen (NORCO) 5-325 MG per tablet Take 1 tablet by mouth every 6 (six) hours as needed for moderate pain. 10/10/14   Tatyana Kirichenko, PA-C  hydrOXYzine (ATARAX/VISTARIL) 25 MG tablet Take 1 tablet (25 mg total) by mouth every 6 (six) hours. Prn itching 04/23/15   Linna Hoff, MD  insulin detemir (LEVEMIR) 100 UNIT/ML injection Inject 65 Units into the  skin 2 (two) times daily. 65 units in the morning and 65 units at night.    Historical Provider, MD  lisinopril (PRINIVIL,ZESTRIL) 10 MG tablet Take 10 mg by mouth daily.    Historical Provider, MD  loratadine (CLARITIN) 10 MG tablet Take 10 mg by mouth daily.    Historical Provider, MD  nebivolol (BYSTOLIC) 5 MG tablet Take 5 mg by mouth daily.    Historical Provider, MD  omeprazole (PRILOSEC) 20 MG capsule Take 20 mg by mouth 2 (two) times daily before a meal.     Historical Provider, MD  ondansetron (ZOFRAN) 4 MG tablet Take 1  tablet (4 mg total) by mouth every 6 (six) hours. Prn N and V Patient not taking: Reported on 06/30/2014 10/28/13   Hayden Rasmussen, NP  potassium chloride (K-DUR) 10 MEQ tablet Take 10 mEq by mouth 2 (two) times daily.     Historical Provider, MD  pseudoephedrine (SUDAFED) 30 MG tablet Take 1 tablet (30 mg total) by mouth every 4 (four) hours as needed for congestion. 10/10/14   Tatyana Kirichenko, PA-C  traMADol (ULTRAM) 50 MG tablet Take 1 tablet (50 mg total) by mouth every 6 (six) hours as needed for severe pain. 07/01/14   Loren Racer, MD   BP 160/115 mmHg  Pulse 63  Temp(Src) 98.7 F (37.1 C) (Oral)  Resp 18  SpO2 97% Physical Exam  Constitutional: She is oriented to person, place, and time. She appears well-developed and well-nourished. No distress.  HENT:  Head: Normocephalic and atraumatic.  Mouth/Throat: Oropharynx is clear and moist. No oropharyngeal exudate.  Eyes: Conjunctivae and EOM are normal. Pupils are equal, round, and reactive to light. Right eye exhibits no discharge. Left eye exhibits no discharge. No scleral icterus.  Neck: Normal range of motion. Neck supple.  No meningismus.   Cardiovascular: Normal rate, regular rhythm, normal heart sounds and intact distal pulses.  Exam reveals no gallop and no friction rub.   No murmur heard. Pulmonary/Chest: Effort normal and breath sounds normal. No respiratory distress. She has no wheezes. She has no rales. She exhibits no tenderness.  Abdominal: Soft. Bowel sounds are normal. She exhibits no distension and no mass. There is tenderness. There is no rebound and no guarding.  TTP of RLQ. Negative Rosvings, obturator or heel jar. Negative Murphy's sign  Musculoskeletal: Normal range of motion. She exhibits no edema or tenderness.  Lymphadenopathy:    She has no cervical adenopathy.  Neurological: She is alert and oriented to person, place, and time.  Strength 5/5 throughout. No sensory deficits.  No gait abnormality.   Skin:  Skin is warm and dry. No rash noted. She is not diaphoretic. No erythema. No pallor.  Psychiatric: She has a normal mood and affect. Her behavior is normal.  Nursing note and vitals reviewed.   ED Course  Procedures (including critical care time)  CT abdomen/pelvis ordered to rule out appendicitis.  Labs Review Labs Reviewed  LIPASE, BLOOD - Abnormal; Notable for the following:    Lipase 17 (*)    All other components within normal limits  COMPREHENSIVE METABOLIC PANEL - Abnormal; Notable for the following:    Chloride 100 (*)    Glucose, Bld 176 (*)    All other components within normal limits  CBC WITH DIFFERENTIAL/PLATELET - Abnormal; Notable for the following:    MCH 25.2 (*)    All other components within normal limits  URINALYSIS, ROUTINE W REFLEX MICROSCOPIC (NOT AT [facility])  CBG MONITORING, ED  Imaging Review Ct Abdomen Pelvis W Contrast  05/04/2015   CLINICAL DATA:  Right lower quadrant abdominal pain with nausea and vomiting since yesterday.  EXAM: CT ABDOMEN AND PELVIS WITH CONTRAST  TECHNIQUE: Multidetector CT imaging of the abdomen and pelvis was performed using the standard protocol following bolus administration of intravenous contrast.  CONTRAST:  OMNIPAQUE IOHEXOL 300 MG/ML  SOLN  COMPARISON:  Lower chest: Minimal linear atelectasis in the lingula. Lung bases are otherwise clear.  Liver: Prominent in size with probable steatosis.  No focal lesion.  Hepatobiliary: Gallbladder physiologically distended, no calcified stone. No biliary dilatation.  Pancreas: Normal.  Spleen: Normal.  Adrenal glands: No nodule.  Kidneys: Symmetric renal enhancement and excretion. No hydronephrosis. No localizing renal abnormality.  Stomach/Bowel: Small hiatal hernia. Stomach physiologically distended. There are no dilated or thickened small bowel loops. Small volume of stool throughout the colon without colonic wall thickening. The appendix is normal.  Vascular/Lymphatic: No  retroperitoneal adenopathy. Abdominal aorta is normal in caliber.  Reproductive: The uterus is surgically absent. Both ovaries are tentatively identified, quiescent normal for age. No adnexal mass.  Bladder: Physiologically distended.  Other: No free air, free fluid, or intra-abdominal fluid collection. There is mild soft tissue stranding of the anterior abdominal wall.  Musculoskeletal: There are no acute or suspicious osseous abnormalities. Multilevel degenerative change throughout the spine with primarily facet arthropathy. Degenerative change of the sacroiliac joints.  FINDINGS: No acute abnormality of the abdomen/pelvis. Particularly, normal appendix.   Electronically Signed   By: Rubye Oaks M.D.   On: 05/04/2015 00:02   I have personally reviewed and evaluated these images and lab results as part of my medical decision-making.   EKG Interpretation None      MDM   Final diagnoses:  Right lower quadrant abdominal pain    He should seen for sudden onset right lower quadrant abdominal pain with associated nausea and vomiting. Concern for appendicitis. CT negative for acute abnormality, rule out appendicitis. Patient is s/p partial hysterectomy. No adnexal masses seen on CT. Vital signs stable. Recommend follow-up with PCP. Discussed with patient and plan who is agreeable. Return precautions outlined in patient discharge instructions.    Lester Kinsman [place], PA-C 05/04/15 0101  Lavera Guise, MD 05/04/15 [phone]

## 2015-05-03 NOTE — ED Notes (Signed)
Pt  Reports  Symptoms  X  1  Days    Getting   Worse

## 2015-05-03 NOTE — ED Notes (Addendum)
Pt reports   Symptoms    Of       r  Sided abdominal  Pain     X  1  Day           With  Symptoms   Worse          With nausea   And  Vomiting           Also reports             Some      Swelling in her  Lips  Associated   With  Some  Hives          Pt  Reports   Pt  A  History  Of  A  Hysterectomy  In  Past     -  Pt  Reports  She  Still  Has  Her  appendex

## 2015-05-03 NOTE — ED Provider Notes (Signed)
CSN: [id number]     Arrival date & time 05/03/15  1303 History   First MD Initiated Contact with Patient 05/03/15 1348     Chief Complaint  Patient presents with  . Abdominal Pain   (Consider location/radiation/quality/duration/timing/severity/associated sxs/prior Treatment) Patient is a 54 y.o. female presenting with abdominal pain.  Abdominal Pain Pain location:  RLQ Pain quality: sharp   Pain radiates to:  Does not radiate Pain severity:  Moderate Onset quality:  Sudden Duration:  1 day Progression:  Waxing and waning Chronicity:  New Context: awakening from sleep   Context: not sick contacts and not suspicious food intake   Relieved by:  None tried Worsened by:  Nothing tried Ineffective treatments:  None tried Associated symptoms: nausea and vomiting   Associated symptoms: no constipation, no diarrhea, no dysuria, no fever, no hematuria and no vaginal bleeding   Risk factors: not pregnant   Risk factors comment:  S/p vag hyst.   Past Medical History  Diagnosis Date  . Hypertension   . Diabetes mellitus   . Coronary artery disease   . Asthma   . CHF (congestive heart failure)   . Arthritis   . Obesity    Past Surgical History  Procedure Laterality Date  . Eye surgery    . Abdominal hysterectomy     Family History  Problem Relation Age of Onset  . Stroke Mother    Social History  Substance Use Topics  . Smoking status: Never Smoker   . Smokeless tobacco: Not on file  . Alcohol Use: No   OB History    No data available     Review of Systems  Constitutional: Negative.  Negative for fever.  Gastrointestinal: Positive for nausea, vomiting and abdominal pain. Negative for diarrhea and constipation.  Genitourinary: Negative for dysuria, urgency, frequency, hematuria, vaginal bleeding and pelvic pain.  Musculoskeletal: Positive for myalgias.  All other systems reviewed and are negative.   Allergies  Shrimp; Penicillins; and Tetracycline  Home  Medications   Prior to Admission medications   Medication Sig Start Date End Date Taking? Authorizing Provider  acetaminophen (TYLENOL) 325 MG tablet Take 650 mg by mouth every 6 (six) hours as needed for mild pain.    Historical Provider, MD  albuterol (PROVENTIL HFA;VENTOLIN HFA) 108 (90 BASE) MCG/ACT inhaler Inhale 2 puffs into the lungs every 6 (six) hours as needed. For wheezing    Historical Provider, MD  atorvastatin (LIPITOR) 20 MG tablet Take 20 mg by mouth daily.    Historical Provider, MD  Cholecalciferol (VITAMIN D PO) Take 50,000 Units by mouth once a week. thursday    Historical Provider, MD  clotrimazole (LOTRIMIN) 1 % cream Apply 1 application topically daily as needed (for rash).    Historical Provider, MD  fluticasone (CUTIVATE) 0.05 % cream Apply topically 2 (two) times daily. 04/23/15   Linna Hoff, MD  furosemide (LASIX) 20 MG tablet Take 20 mg by mouth daily.    Historical Provider, MD  guaiFENesin (ROBITUSSIN) 100 MG/5ML liquid Take 10 mLs (200 mg total) by mouth every 4 (four) hours as needed for cough. 10/10/14   Tatyana Kirichenko, PA-C  hydrochlorothiazide (HYDRODIURIL) 25 MG tablet Take 25 mg by mouth daily.    Historical Provider, MD  HYDROcodone-acetaminophen (NORCO) 5-325 MG per tablet Take 1 tablet by mouth every 6 (six) hours as needed for moderate pain. 10/10/14   Tatyana Kirichenko, PA-C  hydrOXYzine (ATARAX/VISTARIL) 25 MG tablet Take 1 tablet (25 mg total) by mouth  every 6 (six) hours. Prn itching 04/23/15   Linna Hoff, MD  insulin detemir (LEVEMIR) 100 UNIT/ML injection Inject 65 Units into the skin 2 (two) times daily. 65 units in the morning and 65 units at night.    Historical Provider, MD  lisinopril (PRINIVIL,ZESTRIL) 10 MG tablet Take 10 mg by mouth daily.    Historical Provider, MD  loratadine (CLARITIN) 10 MG tablet Take 10 mg by mouth daily.    Historical Provider, MD  nebivolol (BYSTOLIC) 5 MG tablet Take 5 mg by mouth daily.    Historical Provider, MD   omeprazole (PRILOSEC) 20 MG capsule Take 20 mg by mouth 2 (two) times daily before a meal.     Historical Provider, MD  ondansetron (ZOFRAN) 4 MG tablet Take 1 tablet (4 mg total) by mouth every 6 (six) hours. Prn N and V Patient not taking: Reported on 06/30/2014 10/28/13   Hayden Rasmussen, NP  potassium chloride (K-DUR) 10 MEQ tablet Take 10 mEq by mouth 2 (two) times daily.     Historical Provider, MD  pseudoephedrine (SUDAFED) 30 MG tablet Take 1 tablet (30 mg total) by mouth every 4 (four) hours as needed for congestion. 10/10/14   Tatyana Kirichenko, PA-C  traMADol (ULTRAM) 50 MG tablet Take 1 tablet (50 mg total) by mouth every 6 (six) hours as needed for severe pain. 07/01/14   Loren Racer, MD   Meds Ordered and Administered this Visit  Medications - No data to display  BP 168/84 mmHg  Pulse 61  Temp(Src) 98.6 F (37 C) (Oral)  Resp 20  SpO2 97% No data found.   Physical Exam  Constitutional: She is oriented to person, place, and time. She appears well-developed and well-nourished. She appears distressed.  Abdominal: Soft. Normal appearance. She exhibits no distension and no mass. Bowel sounds are decreased. There is tenderness in the right lower quadrant. There is tenderness at McBurney's point. There is no rigidity, no rebound, no guarding, no CVA tenderness and negative Murphy's sign.  Neurological: She is alert and oriented to person, place, and time.  Skin: Skin is warm and dry.  Nursing note and vitals reviewed.   ED Course  Procedures (including critical care time)  Labs Review Labs Reviewed - No data to display  Imaging Review No results found.   Visual Acuity Review  Right Eye Distance:   Left Eye Distance:   Bilateral Distance:    Right Eye Near:   Left Eye Near:    Bilateral Near:         MDM   1. Abdominal pain in female patient    Sent for eval of abd pain with sev episodes of n/v, no diarrhea, no fever, sx localized to rlq.    Linna Hoff, MD 05/03/15 [phone]

## 2015-05-03 NOTE — ED Notes (Signed)
Pt sts RLQ pain with N/V and some HA and cough

## 2015-05-04 MED ORDER — ACETAMINOPHEN 500 MG PO TABS: 500.0000 mg | ORAL_TABLET | Freq: Four times a day (QID) | ORAL | Status: DC | PRN

## 2015-05-04 MED ORDER — ONDANSETRON HCL 4 MG PO TABS: 4.0000 mg | ORAL_TABLET | Freq: Four times a day (QID) | ORAL | Status: DC

## 2015-05-04 NOTE — Discharge Instructions (Signed)
Abdominal Pain, Women °Abdominal (stomach, pelvic, or belly) pain can be caused by many things. It is important to tell your doctor: °· The location of the pain. °· Does it come and go or is it present all the time? °· Are there things that start the pain (eating certain foods, exercise)? °· Are there other symptoms associated with the pain (fever, nausea, vomiting, diarrhea)? °All of this is helpful to know when trying to find the cause of the pain. °CAUSES  °· Stomach: virus or bacteria infection, or ulcer. °· Intestine: appendicitis (inflamed appendix), regional ileitis (Crohn's disease), ulcerative colitis (inflamed colon), irritable bowel syndrome, diverticulitis (inflamed diverticulum of the colon), or cancer of the stomach or intestine. °· Gallbladder disease or stones in the gallbladder. °· Kidney disease, kidney stones, or infection. °· Pancreas infection or cancer. °· Fibromyalgia (pain disorder). °· Diseases of the female organs: °¨ Uterus: fibroid (non-cancerous) tumors or infection. °¨ Fallopian tubes: infection or tubal pregnancy. °¨ Ovary: cysts or tumors. °¨ Pelvic adhesions (scar tissue). °¨ Endometriosis (uterus lining tissue growing in the pelvis and on the pelvic organs). °¨ Pelvic congestion syndrome (female organs filling up with blood just before the menstrual period). °¨ Pain with the menstrual period. °¨ Pain with ovulation (producing an egg). °¨ Pain with an IUD (intrauterine device, birth control) in the uterus. °¨ Cancer of the female organs. °· Functional pain (pain not caused by a disease, may improve without treatment). °· Psychological pain. °· Depression. °DIAGNOSIS  °Your doctor will decide the seriousness of your pain by doing an examination. °· Blood tests. °· X-rays. °· Ultrasound. °· CT scan (computed tomography, special type of X-ray). °· MRI (magnetic resonance imaging). °· Cultures, for infection. °· Barium enema (dye inserted in the large intestine, to better view it with  X-rays). °· Colonoscopy (looking in intestine with a lighted tube). °· Laparoscopy (minor surgery, looking in abdomen with a lighted tube). °· Major abdominal exploratory surgery (looking in abdomen with a large incision). °TREATMENT  °The treatment will depend on the cause of the pain.  °· Many cases can be observed and treated at home. °· Over-the-counter medicines recommended by your caregiver. °· Prescription medicine. °· Antibiotics, for infection. °· Birth control pills, for painful periods or for ovulation pain. °· Hormone treatment, for endometriosis. °· Nerve blocking injections. °· Physical therapy. °· Antidepressants. °· Counseling with a psychologist or psychiatrist. °· Minor or major surgery. °HOME CARE INSTRUCTIONS  °· Do not take laxatives, unless directed by your caregiver. °· Take over-the-counter pain medicine only if ordered by your caregiver. Do not take aspirin because it can cause an upset stomach or bleeding. °· Try a clear liquid diet (broth or water) as ordered by your caregiver. Slowly move to a bland diet, as tolerated, if the pain is related to the stomach or intestine. °· Have a thermometer and take your temperature several times a day, and record it. °· Bed rest and sleep, if it helps the pain. °· Avoid sexual intercourse, if it causes pain. °· Avoid stressful situations. °· Keep your follow-up appointments and tests, as your caregiver orders. °· If the pain does not go away with medicine or surgery, you may try: °¨ Acupuncture. °¨ Relaxation exercises (yoga, meditation). °¨ Group therapy. °¨ Counseling. °SEEK MEDICAL CARE IF:  °· You notice certain foods cause stomach pain. °· Your home care treatment is not helping your pain. °· You need stronger pain medicine. °· You want your IUD removed. °· You feel faint or   lightheaded.  You develop nausea and vomiting.  You develop a rash.  You are having side effects or an allergy to your medicine. SEEK IMMEDIATE MEDICAL CARE IF:   Your  pain does not go away or gets worse.  You have a fever.  Your pain is felt only in portions of the abdomen. The right side could possibly be appendicitis. The left lower portion of the abdomen could be colitis or diverticulitis.  You are passing blood in your stools (bright red or black tarry stools, with or without vomiting).  You have blood in your urine.  You develop chills, with or without a fever.  You pass out. MAKE SURE YOU:   Understand these instructions.  Will watch your condition.  Will get help right away if you are not doing well or get worse. Document Released: 06/01/2007 Document Revised: 12/19/2013 Document Reviewed: 06/21/2009 [facility] Patient Information 2015 [place], [place]. This information is not intended to replace advice given to you by your health care provider. Make sure you discuss any questions you have with your health care provider.  Follow up with PCP. Return to the emergency department if you experience worsening abdominal pain, difficulty urinating or having a bowel movement, intractable vomiting, fever, shortness of breath.

## 2015-08-07 DIAGNOSIS — E784 Other hyperlipidemia: Secondary | ICD-10-CM | POA: Diagnosis not present

## 2015-08-07 DIAGNOSIS — I10 Essential (primary) hypertension: Secondary | ICD-10-CM | POA: Diagnosis not present

## 2015-08-07 DIAGNOSIS — G609 Hereditary and idiopathic neuropathy, unspecified: Secondary | ICD-10-CM | POA: Diagnosis not present

## 2015-08-07 DIAGNOSIS — E119 Type 2 diabetes mellitus without complications: Secondary | ICD-10-CM | POA: Diagnosis not present

## 2015-08-07 DIAGNOSIS — R29818 Other symptoms and signs involving the nervous system: Secondary | ICD-10-CM | POA: Insufficient documentation

## 2015-08-07 DIAGNOSIS — Z23 Encounter for immunization: Secondary | ICD-10-CM | POA: Diagnosis not present

## 2015-08-07 DIAGNOSIS — Z6841 Body Mass Index (BMI) 40.0 and over, adult: Secondary | ICD-10-CM | POA: Diagnosis not present

## 2015-08-07 DIAGNOSIS — I509 Heart failure, unspecified: Secondary | ICD-10-CM | POA: Diagnosis not present

## 2015-09-09 ENCOUNTER — Encounter (HOSPITAL_COMMUNITY): Payer: Self-pay | Admitting: Emergency Medicine

## 2015-09-09 ENCOUNTER — Emergency Department (HOSPITAL_COMMUNITY)
Admission: EM | Admit: 2015-09-09 | Discharge: 2015-09-09 | Disposition: A | Discharge status: Home / Self Care | Payer: Commercial Managed Care - HMO | Attending: Emergency Medicine | Admitting: Emergency Medicine

## 2015-09-09 ENCOUNTER — Emergency Department (HOSPITAL_COMMUNITY): Payer: Commercial Managed Care - HMO

## 2015-09-09 DIAGNOSIS — E669 Obesity, unspecified: Secondary | ICD-10-CM | POA: Diagnosis not present

## 2015-09-09 DIAGNOSIS — M545 Low back pain, unspecified: Secondary | ICD-10-CM

## 2015-09-09 DIAGNOSIS — I509 Heart failure, unspecified: Secondary | ICD-10-CM | POA: Diagnosis not present

## 2015-09-09 DIAGNOSIS — M7989 Other specified soft tissue disorders: Secondary | ICD-10-CM | POA: Insufficient documentation

## 2015-09-09 DIAGNOSIS — Z7982 Long term (current) use of aspirin: Secondary | ICD-10-CM | POA: Insufficient documentation

## 2015-09-09 DIAGNOSIS — M79605 Pain in left leg: Secondary | ICD-10-CM | POA: Diagnosis not present

## 2015-09-09 DIAGNOSIS — M79604 Pain in right leg: Secondary | ICD-10-CM | POA: Insufficient documentation

## 2015-09-09 DIAGNOSIS — Z794 Long term (current) use of insulin: Secondary | ICD-10-CM | POA: Insufficient documentation

## 2015-09-09 DIAGNOSIS — I10 Essential (primary) hypertension: Secondary | ICD-10-CM | POA: Diagnosis not present

## 2015-09-09 DIAGNOSIS — R008 Other abnormalities of heart beat: Secondary | ICD-10-CM | POA: Diagnosis not present

## 2015-09-09 DIAGNOSIS — G8929 Other chronic pain: Secondary | ICD-10-CM | POA: Diagnosis not present

## 2015-09-09 DIAGNOSIS — Z79899 Other long term (current) drug therapy: Secondary | ICD-10-CM | POA: Diagnosis not present

## 2015-09-09 DIAGNOSIS — E119 Type 2 diabetes mellitus without complications: Secondary | ICD-10-CM | POA: Insufficient documentation

## 2015-09-09 DIAGNOSIS — M1990 Unspecified osteoarthritis, unspecified site: Secondary | ICD-10-CM | POA: Insufficient documentation

## 2015-09-09 DIAGNOSIS — I2510 Atherosclerotic heart disease of native coronary artery without angina pectoris: Secondary | ICD-10-CM | POA: Insufficient documentation

## 2015-09-09 DIAGNOSIS — R0602 Shortness of breath: Secondary | ICD-10-CM | POA: Diagnosis not present

## 2015-09-09 DIAGNOSIS — J45909 Unspecified asthma, uncomplicated: Secondary | ICD-10-CM | POA: Insufficient documentation

## 2015-09-09 DIAGNOSIS — Z7984 Long term (current) use of oral hypoglycemic drugs: Secondary | ICD-10-CM | POA: Diagnosis not present

## 2015-09-09 DIAGNOSIS — Z0389 Encounter for observation for other suspected diseases and conditions ruled out: Secondary | ICD-10-CM | POA: Diagnosis not present

## 2015-09-09 LAB — CBC
HCT: 39.5 % (ref 36.0–46.0)
Hemoglobin: 12.4 g/dL (ref 12.0–15.0)
MCH: 25.6 pg — ABNORMAL LOW (ref 26.0–34.0)
MCHC: 31.4 g/dL (ref 30.0–36.0)
MCV: 81.6 fL (ref 78.0–100.0)
Platelets: 222 10*3/uL (ref 150–400)
RBC: 4.84 MIL/uL (ref 3.87–5.11)
RDW: 14.6 % (ref 11.5–15.5)
WBC: 8.2 10*3/uL (ref 4.0–10.5)

## 2015-09-09 LAB — BASIC METABOLIC PANEL
Anion gap: 8 (ref 5–15)
BUN: 14 mg/dL (ref 6–20)
CO2: 29 mmol/L (ref 22–32)
Calcium: 9.9 mg/dL (ref 8.9–10.3)
Chloride: 101 mmol/L (ref 101–111)
Creatinine, Ser: 0.78 mg/dL (ref 0.44–1.00)
GFR calc Af Amer: >60 mL/min (ref >60)
GFR calc non Af Amer: >60 mL/min (ref >60)
Glucose, Bld: 125 mg/dL — ABNORMAL HIGH (ref 65–99)
Potassium: 3.9 mmol/L (ref 3.5–5.1)
Sodium: 138 mmol/L (ref 135–145)

## 2015-09-09 LAB — I-STAT TROPONIN, ED: Troponin i, poc: 0 ng/mL (ref 0.00–0.08)

## 2015-09-09 MED ORDER — OXYCODONE-ACETAMINOPHEN 5-325 MG PO TABS
1.0000 | ORAL_TABLET | Freq: Once | ORAL | Status: AC
  Administered 2015-09-09: 1 via ORAL
  Filled 2015-09-09: qty 1

## 2015-09-09 MED ORDER — ENOXAPARIN SODIUM 150 MG/ML ~~LOC~~ SOLN
1.0000 mg/kg | Freq: Once | SUBCUTANEOUS | Status: DC
  Filled 2015-09-09: qty 0.88

## 2015-09-09 NOTE — ED Provider Notes (Signed)
CSN: [id number]     Arrival date & time 09/09/15  1641 History   First MD Initiated Contact with Patient 09/09/15 2034     Chief Complaint  Patient presents with  . Back Pain  . Leg Pain  . heart fluttering     HPI   55 year old female presents today for evaluation of DVT. Patient reports that for the last week both of her legs have been hurting. She reports that she has pins in both of her legs, worse on the left side. She notes the pain has gotten worse over the last several days with swelling to the left leg. She reports that her lower back has been hurting was well, she describes this is sharp pains, typical of her chronic back pain. She called [facility] provider [facility] who instructed her to come to the emergency room for ultrasound to rule out a DVT. Patient denies any history DVTs, history of unilateral leg swelling, does note a history of congestive heart failure with bilateral baseline edema. She denies any prolonged immobilization, recent surgeries, active malignancy, estrogen use, or any other risk factors for PE or DVT. Patient denies any chest pain, shortness of breath, orthopnea, or any other concerning signs for PE or congestive heart failure.  Patient notes intermittent fluttering of her heart, she reports this is typical, with no associated shortness of breath diaphoresis, chest pain. At this time I evaluation patient has no upper respiratory, chest complaints other than a dry nonproductive cough. Patient denies any trauma to her lower back, fever, sensory deficits, loss of strength or motor function. She denies any saddle anesthesia, bowel or bladder incontinence, or any other back pain red flags.  Review shows patient has been seen for similar presentation on 11/25/2013 with a negative ultrasound at that time.  Past Medical History  Diagnosis Date  . Hypertension   . Diabetes mellitus   . Coronary artery disease   . Asthma   . CHF (congestive heart  failure) (HCC)   . Arthritis   . Obesity    Past Surgical History  Procedure Laterality Date  . Eye surgery    . Abdominal hysterectomy     Family History  Problem Relation Age of Onset  . Stroke Mother    Social History  Substance Use Topics  . Smoking status: Never Smoker   . Smokeless tobacco: None  . Alcohol Use: No   OB History    No data available     Review of Systems  All other systems reviewed and are negative.   Allergies  Shrimp; Tetracycline; and Penicillins  Home Medications   Prior to Admission medications   Medication Sig Start Date End Date Taking? Authorizing Provider  acetaminophen (TYLENOL) 500 MG tablet Take 1 tablet (500 mg total) by mouth every 6 (six) hours as needed. 05/04/15  Yes Samantha Tripp Dowless, PA-C  albuterol (PROVENTIL HFA;VENTOLIN HFA) 108 (90 BASE) MCG/ACT inhaler Inhale 2 puffs into the lungs every 6 (six) hours as needed. For wheezing   Yes Historical Provider, MD  aspirin EC 81 MG tablet Take 81 mg by mouth daily.   Yes Historical Provider, MD  atorvastatin (LIPITOR) 80 MG tablet Take 80 mg by mouth every morning.  04/25/15  Yes Historical Provider, MD  Cholecalciferol 50000 units capsule Take 50,000 Units by mouth every Thursday.    Yes Historical Provider, MD  clotrimazole (LOTRIMIN) 1 % cream Apply 1 application topically daily as needed (for rash).   Yes  Historical Provider, MD  clotrimazole-betamethasone (LOTRISONE) cream Apply 1 application topically 2 (two) times daily as needed (for rash).   Yes Historical Provider, MD  esomeprazole (NEXIUM) 40 MG capsule Take 40 mg by mouth 2 (two) times daily before a meal.  04/25/15  Yes Historical Provider, MD  furosemide (LASIX) 20 MG tablet Take 20 mg by mouth daily.   Yes Historical Provider, MD  glimepiride (AMARYL) 4 MG tablet Take 4 mg by mouth daily with breakfast.  04/25/15  Yes Historical Provider, MD  hydrOXYzine (ATARAX/VISTARIL) 25 MG tablet Take 1 tablet (25 mg total) by mouth  every 6 (six) hours. Prn itching 04/23/15  Yes Linna Hoff, MD  insulin detemir (LEVEMIR) 100 UNIT/ML injection Inject 80 Units into the skin 2 (two) times daily.    Yes Historical Provider, MD  lisinopril-hydrochlorothiazide (PRINZIDE,ZESTORETIC) 20-25 MG per tablet Take 1 tablet by mouth daily. 04/25/15  Yes Historical Provider, MD  nebivolol (BYSTOLIC) 5 MG tablet Take 5 mg by mouth daily.   Yes Historical Provider, MD  potassium chloride (K-DUR) 10 MEQ tablet Take 10 mEq by mouth daily.    Yes Historical Provider, MD  fluticasone (CUTIVATE) 0.05 % cream Apply topically 2 (two) times daily. 04/23/15   Linna Hoff, MD  ondansetron (ZOFRAN) 4 MG tablet Take 1 tablet (4 mg total) by mouth every 6 (six) hours. 05/04/15   Samantha Tripp Dowless, PA-C   BP 126/81 mmHg  Pulse 58  Temp(Src) 98.4 F (36.9 C) (Oral)  Resp 18  Wt 132.45 kg  SpO2 97%   Physical Exam  Constitutional: She is oriented to person, place, and time. She appears well-developed and well-nourished. No distress.  HENT:  Head: Normocephalic and atraumatic.  Eyes: Conjunctivae are normal. Pupils are equal, round, and reactive to light. Right eye exhibits no discharge. Left eye exhibits no discharge. No scleral icterus.  Neck: Normal range of motion. Neck supple. No JVD present. No tracheal deviation present.  Cardiovascular: Normal rate, regular rhythm, normal heart sounds and intact distal pulses.  Exam reveals no gallop and no friction rub.   No murmur heard. Pulmonary/Chest: Effort normal and breath sounds normal. No stridor. No respiratory distress. She has no wheezes. She has no rales. She exhibits no tenderness.  Musculoskeletal: Normal range of motion. She exhibits tenderness. She exhibits no edema.  No C, T, or L spine tenderness to palpation. No obvious signs of trauma, deformity, infection, step-offs. Lung expansion normal. No scoliosis or kyphosis. Bilateral lower extremity strength 5 out of 5, sensation grossly intact,  patellar reflexes 2+, pedal pulses 2+, Refill less than 3 seconds.  She does have minor amount of distal edema, her legs are equal bilateral with no unilateral swelling. Patient has tenderness to the left knee and posterior calf.   Straight leg negative Ambulates with minimal difficulty   Neurological: She is alert and oriented to person, place, and time. Coordination normal.  Skin: Skin is warm and dry. No rash noted. She is not diaphoretic. No erythema. No pallor.  Psychiatric: She has a normal mood and affect. Her behavior is normal. Judgment and thought content normal.  Nursing note and vitals reviewed.     ED Course  Procedures (including critical care time) Labs Review Labs Reviewed  BASIC METABOLIC PANEL - Abnormal; Notable for the following:    Glucose, Bld 125 (*)    All other components within normal limits  CBC - Abnormal; Notable for the following:    MCH 25.6 (*)  All other components within normal limits  I-STAT TROPOININ, ED    Imaging Review Dg Chest 2 View  09/09/2015  CLINICAL DATA:  Chest pain and shortness of breath since last evening. EXAM: CHEST  2 VIEW COMPARISON:  Chest CT 11/25/2013. FINDINGS: The cardiac silhouette, mediastinal and hilar contours are within normal limits. The lungs are clear. No pleural effusion. The bony thorax is intact. IMPRESSION: No acute cardiopulmonary findings. Electronically Signed   By: Rudie Meyer M.D.   On: 09/09/2015 18:56   I have personally reviewed and evaluated these images and lab results as part of my medical decision-making.   EKG Interpretation None      MDM   Final diagnoses:  Pain of left lower extremity  Bilateral low back pain without sciatica    Labs: I-STAT troponin, BMP, CBC, no significant findings  Imaging: DG chest 2 view is significant findings; ED EKG normal sinus rhythm  Consults:  Therapeutics: Percocet  Discharge Meds:   Assessment/Plan: Patient's presentation most likely  represents acute on chronic low back pain. Patient has reports of "pins in her lower extremities, but has no loss of sensation strength or motor function. She is neurovascularly intact and has no red flags for back pain. Patient reports that her left leg is swollen, I cannot appreciate swelling at this time, but she is tender to palpation of the posterior calf. Patient has no history of DVT or PE, has no risk factors for today. Patient would like ultrasound for DVT rule out, this is unavailable at this time night. I discussed the need for Lovenox for prophylactic therapy, patient expressed great concern for taking anything that causes her to bleed and requested not to receive the medication. I discussed benefits and risks with patient, she understood the potential life-threatening consequences of not receiving Lovenox. Patient has no signs or symptoms that would indicate PE/ DVT other than the reported unilateral leg swelling. I feel she is safe for discharge home with ultrasound tomorrow morning. She was given Percocet here in the ED for her back pain, she is instructed follow-up with her primary care for reevaluation further management of her chronic pain. She was given strict return precautions, follow-up information. Patient verbalized understanding and agreement for today's plan and had no further questions or concerns at time of discharge        Eyvonne Mechanic, PA-C 09/09/15 2219  Gerhard Munch, MD 09/09/15 [phone]

## 2015-09-09 NOTE — Discharge Instructions (Signed)
Please return to [facility] emergency room tomorrow morning at 8 AM for ultrasound of left lower extremity. His contact her primary care provider first thing tomorrow morning and schedule follow-up evaluation .   Back Pain, Adult Back pain is very common in adults.The cause of back pain is rarely dangerous and the pain often gets better over time.The cause of your back pain may not be known. Some common causes of back pain include:  Strain of the muscles or ligaments supporting the spine.  Wear and tear (degeneration) of the spinal disks.  Arthritis.  Direct injury to the back. For many people, back pain may return. Since back pain is rarely dangerous, most people can learn to manage this condition on their own. HOME CARE INSTRUCTIONS Watch your back pain for any changes. The following actions may help to lessen any discomfort you are feeling:  Remain active. It is stressful on your back to sit or stand in one place for long periods of time. Do not sit, drive, or stand in one place for more than 30 minutes at a time. Take short walks on even surfaces as soon as you are able.Try to increase the length of time you walk each day.  Exercise regularly as directed by your health care provider. Exercise helps your back heal faster. It also helps avoid future injury by keeping your muscles strong and flexible.  Do not stay in bed.Resting more than 1-2 days can delay your recovery.  Pay attention to your body when you bend and lift. The most comfortable positions are those that put less stress on your recovering back. Always use proper lifting techniques, including:  Bending your knees.  Keeping the load close to your body.  Avoiding twisting.  Find a comfortable position to sleep. Use a firm mattress and lie on your side with your knees slightly bent. If you lie on your back, put a pillow under your knees.  Avoid feeling anxious or stressed.Stress increases muscle tension and can worsen  back pain.It is important to recognize when you are anxious or stressed and learn ways to manage it, such as with exercise.  Take medicines only as directed by your health care provider. Over-the-counter medicines to reduce pain and inflammation are often the most helpful.Your health care provider may prescribe muscle relaxant drugs.These medicines help dull your pain so you can more quickly return to your normal activities and healthy exercise.  Apply ice to the injured area:  Put ice in a plastic bag.  Place a towel between your skin and the bag.  Leave the ice on for 20 minutes, 2-3 times a day for the first 2-3 days. After that, ice and heat may be alternated to reduce pain and spasms.  Maintain a healthy weight. Excess weight puts extra stress on your back and makes it difficult to maintain good posture. SEEK MEDICAL CARE IF:  You have pain that is not relieved with rest or medicine.  You have increasing pain going down into the legs or buttocks.  You have pain that does not improve in one week.  You have night pain.  You lose weight.  You have a fever or chills. SEEK IMMEDIATE MEDICAL CARE IF:   You develop new bowel or bladder control problems.  You have unusual weakness or numbness in your arms or legs.  You develop nausea or vomiting.  You develop abdominal pain.  You feel faint.   This information is not intended to replace advice given to you  by your health care provider. Make sure you discuss any questions you have with your health care provider.   Document Released: 08/04/2005 Document Revised: 08/25/2014 Document Reviewed: 12/06/2013 Elsevier Interactive Patient Education [employer]

## 2015-09-09 NOTE — ED Notes (Signed)
C/o lower back pain with L leg pain and swelling since Friday.  Also reports fluttering in L chest that started today.  Denies nausea, vomiting, sob.

## 2015-09-10 ENCOUNTER — Ambulatory Visit (HOSPITAL_COMMUNITY): Payer: Medicare HMO

## 2015-09-12 DIAGNOSIS — M79605 Pain in left leg: Secondary | ICD-10-CM | POA: Diagnosis not present

## 2015-09-12 DIAGNOSIS — Z6841 Body Mass Index (BMI) 40.0 and over, adult: Secondary | ICD-10-CM | POA: Diagnosis not present

## 2015-09-12 DIAGNOSIS — I509 Heart failure, unspecified: Secondary | ICD-10-CM | POA: Diagnosis not present

## 2015-10-02 DIAGNOSIS — M5442 Lumbago with sciatica, left side: Secondary | ICD-10-CM | POA: Diagnosis not present

## 2015-10-02 DIAGNOSIS — M4316 Spondylolisthesis, lumbar region: Secondary | ICD-10-CM | POA: Diagnosis not present

## 2015-10-10 DIAGNOSIS — M5442 Lumbago with sciatica, left side: Secondary | ICD-10-CM | POA: Diagnosis not present

## 2015-10-10 DIAGNOSIS — M5441 Lumbago with sciatica, right side: Secondary | ICD-10-CM | POA: Diagnosis not present

## 2015-10-16 DIAGNOSIS — M4316 Spondylolisthesis, lumbar region: Secondary | ICD-10-CM | POA: Diagnosis not present

## 2015-10-16 DIAGNOSIS — M5442 Lumbago with sciatica, left side: Secondary | ICD-10-CM | POA: Diagnosis not present

## 2015-10-16 DIAGNOSIS — M47896 Other spondylosis, lumbar region: Secondary | ICD-10-CM | POA: Diagnosis not present

## 2015-11-05 ENCOUNTER — Ambulatory Visit (HOSPITAL_COMMUNITY): Payer: Medicare HMO | Admitting: Physical Therapy

## 2015-11-07 DIAGNOSIS — I10 Essential (primary) hypertension: Secondary | ICD-10-CM | POA: Diagnosis not present

## 2015-11-07 DIAGNOSIS — E119 Type 2 diabetes mellitus without complications: Secondary | ICD-10-CM | POA: Diagnosis not present

## 2015-11-07 DIAGNOSIS — I509 Heart failure, unspecified: Secondary | ICD-10-CM | POA: Diagnosis not present

## 2015-11-07 DIAGNOSIS — Z1389 Encounter for screening for other disorder: Secondary | ICD-10-CM | POA: Diagnosis not present

## 2015-11-07 DIAGNOSIS — E668 Other obesity: Secondary | ICD-10-CM | POA: Diagnosis not present

## 2015-11-07 DIAGNOSIS — Z6841 Body Mass Index (BMI) 40.0 and over, adult: Secondary | ICD-10-CM | POA: Diagnosis not present

## 2015-11-22 ENCOUNTER — Ambulatory Visit: Payer: Commercial Managed Care - HMO | Attending: Internal Medicine

## 2015-11-22 VITALS — BP 128/72 | HR 60

## 2015-11-22 DIAGNOSIS — M6281 Muscle weakness (generalized): Secondary | ICD-10-CM | POA: Insufficient documentation

## 2015-11-22 DIAGNOSIS — M545 Low back pain: Secondary | ICD-10-CM | POA: Diagnosis not present

## 2015-11-22 DIAGNOSIS — R262 Difficulty in walking, not elsewhere classified: Secondary | ICD-10-CM | POA: Diagnosis not present

## 2015-11-22 NOTE — Therapy (Signed)
[facility] [address] [place], [place], [address] Phone: [phone]   Fax:  [phone]  Physical Therapy Evaluation  Patient Details  Name: VI BIDDINGER MRN: [id number] Date of Birth: 06-Feb-1961 Referring Provider: Dr Rodrigo Ran  Encounter Date: 11/22/2015      PT End of Session - 11/22/15 1738    Visit Number 1   Number of Visits 24   Date for PT Re-Evaluation 01/17/16   PT Start Time 1415   PT Stop Time 1500   PT Time Calculation (min) 45 min   Activity Tolerance Patient tolerated treatment well   Behavior During Therapy [facility] for tasks assessed/performed      Past Medical History  Diagnosis Date  . Hypertension   . Diabetes mellitus   . Coronary artery disease   . Asthma   . CHF (congestive heart failure) (HCC)   . Arthritis   . Obesity     Past Surgical History  Procedure Laterality Date  . Eye surgery    . Abdominal hysterectomy      Filed Vitals:   11/22/15 1440  BP: 128/72  Pulse: 60    Visit Diagnosis:  Midline low back pain, with sciatica presence unspecified - Plan: PT plan of care cert/re-cert  Muscle weakness (generalized) - Plan: PT plan of care cert/re-cert  Difficulty in walking, not elsewhere classified - Plan: PT plan of care cert/re-cert      Subjective Assessment - 11/22/15 1421    Subjective Pt reports chronic hx of back pain (intermittent back pain 5- 9) with exacerbation of symptoms that radiated severe pain into bilat LEs on March 10th that sent her to ER. Pt went to PCP who referred to specialist, Dr Enrique Sack, who did an MRI and found "disc problem and arthritis."   Pertinent History HTN, DM, Cholesterol, DJD, asthma, obesity    Limitations Standing;Walking   How long can you sit comfortably? 20 mins and stiffness onsets    How long can you stand comfortably? 5 minutes    How long can you walk comfortably? 8  mins    Diagnostic tests MRI   Patient Stated Goals return to  gym    Currently in Pain? Yes   Pain Score 8    Pain Location Back   Pain Orientation Mid;Right;Left   Pain Descriptors / Indicators Aching;Numbness   Pain Radiating Towards into anterior legs to mid anterior lower leg ( below the knee)    Pain Onset More than a month ago   Pain Frequency Constant   Aggravating Factors  standing and walking, prolonged sitting.    Pain Relieving Factors sitting and lying down, medication    Effect of Pain on Daily Activities difficulty with housework, prolonged walking.             [facility] PT Assessment - 11/22/15 0001    Assessment   Medical Diagnosis LBP with radiculopathy   Referring Provider Dr Rodrigo Ran   Onset Date/Surgical Date 10/26/15   Hand Dominance Right   Next MD Visit June   Prior Therapy none   Precautions   Precautions None   Restrictions   Weight Bearing Restrictions No   Balance Screen   Has the patient fallen in the past 6 months No   Home Environment   Living Environment Private residence   Prior Function   Level of Independence Independent   Cognition   Overall Cognitive Status Within Functional Limits for tasks assessed   Observation/Other Assessments  Focus on Therapeutic Outcomes (FOTO)  Intake: 84% limited. Predicted: 52% limited   Posture/Postural Control   Posture/Postural Control Postural limitations   Postural Limitations Rounded Shoulders;Forward head;Increased lumbar lordosis;Increased thoracic kyphosis;Anterior pelvic tilt;Flexed trunk   ROM / Strength   AROM / PROM / Strength AROM;Strength   AROM   AROM Assessment Site Hip;Lumbar   Right/Left Hip Right;Left   Right Hip Extension -20  lacking from neutral   Left Hip Extension -20  lacking from neutral   Lumbar Flexion 35   Lumbar Extension 8   Lumbar - Right Side Bend 8   Lumbar - Left Side Bend 8   Strength   Strength Assessment Site Hip;Knee   Right/Left Hip Right;Left   Right Hip Flexion 3-/5   Right Hip ABduction 2/5   Left Hip Flexion  2+/5   Left Hip ABduction 2/5   Right/Left Knee Right;Left   Right Knee Flexion 4-/5   Right Knee Extension 4-/5   Left Knee Flexion 4-/5   Left Knee Extension 4-/5   Flexibility   Soft Tissue Assessment /Muscle Length yes   Special Tests    Special Tests Lumbar  Lumbar Foraminal closure: + bilat    Lumbar Tests Straight Leg Raise   Straight Leg Raise   Findings Positive   Side  --  bilat   Comment Pain at 5 degrees with SLR                   [facility] Adult PT Treatment/Exercise - 11/22/15 0001    Self-Care   Self-Care Other Self-Care Comments   Other Self-Care Comments  see education section    Exercises   Exercises Lumbar   Lumbar Exercises: Stretches   Single Knee to Chest Stretch 1 rep;30 seconds   Single Knee to Chest Stretch Limitations increased pain, so  discontinued                PT Education - 11/22/15 1742    Education provided Yes   Education Details PT POC, recommended 3 x a week, log roll, effects of obesity on pain and joints. recommended healthy weight loss and the roll healthy eating  in weight loss.    Person(s) Educated Patient   Methods Explanation;Demonstration;Handout   Comprehension Verbalized understanding          PT Short Term Goals - 11/22/15 1748    PT SHORT TERM GOAL #1   Title Pt will be independent with initial HEP for continued strengthening and mobility by 12/22/15    Time 4   Period Weeks   Status New   PT SHORT TERM GOAL #2   Title Pt will be able to demo posture and body mechanics as it relates to lumbar spine by 12/22/15.    Time 4   Period Weeks   Status New   PT SHORT TERM GOAL #3   Title Lumbar extension AROM improved to 10 degrees and pain - free by 12/22/15.   Time 4   Period Weeks   Status New           PT Long Term Goals - 11/22/15 1749    PT LONG TERM GOAL #1   Title FOTO score will improve from 84% limited to 52% to demo improved function and mobility by 01/16/16.     Time 8   Period Weeks    Status New   PT LONG TERM GOAL #2   Title SLR will improve to 40 degrees without increased pain  in order to walk for 30 mins without increased pain by 01/16/16   Time 8   Period Weeks   Status New   PT LONG TERM GOAL #3   Title Hip ABD and flexion  strength will improve to 3+/5   in order to walk for 30 mins without increased pain by 01/16/16.    Time 8   Period Weeks   Status New   PT LONG TERM GOAL #4   Title Hip extension will improve to -10 degrees   in order to stand for 30 mins without increased pain by 01/16/16.    Time 8   Period Weeks   Status New               Plan - 12-01-15 1745    Clinical Impression Statement Pt presents for mod complexity evaluation for back pain. Signs and symptoms are compatible with lumbar stenosis with radiculopathy. Pt presents with impairments including pain, impaired mobility/ROM, and impaired strength, which limit pt's functional abilities with sitting, standing, walking, bending, lifting. Pt will benefit from oupt PT for 3 times a week for 8 weeks for core strengthening, stretching, pt education in order to address these impairments and functional limitations and diagnosis of back pain wiht radiculopathy, weakness, and difficulty in walking,  and return pt to pain-free PLOF.    Pt will benefit from skilled therapeutic intervention in order to improve on the following deficits Abnormal gait;Decreased activity tolerance;Decreased balance;Decreased mobility;Decreased strength;Postural dysfunction;Impaired flexibility;Improper body mechanics;Pain;Obesity;Increased muscle spasms;Decreased endurance;Decreased range of motion;Difficulty walking   Rehab Potential Good   PT Frequency 3x / week   PT Duration 8 weeks   PT Treatment/Interventions ADLs/Self Care Home Management;Cryotherapy;Electrical Stimulation;Moist Heat;Traction;Therapeutic activities;Therapeutic exercise;Balance training;Functional mobility training;Stair training;Gait  training;Ultrasound;Neuromuscular re-education;Cognitive remediation;Patient/family education;Manual techniques;Taping;Energy conservation;Dry needling;Passive range of motion   PT Next Visit Plan Est HEP with stretching and core strengthening.    PT Home Exercise Plan none established   Consulted and Agree with Plan of Care Patient          G-Codes - 12-01-2015 1800    Functional Assessment Tool Used FOTO and clinical judgement    Functional Limitation Mobility: Walking and moving around   Mobility: Walking and Moving Around Current Status [phone]) At least 80 percent but less than 100 percent impaired, limited or restricted   Mobility: Walking and Moving Around Goal Status [phone]) At least 40 percent but less than 60 percent impaired, limited or restricted       Problem List Patient Active Problem List   Diagnosis Date Noted  . DM, UNCOMPLICATED, TYPE II, UNCONTROLLED 02/12/2007  . HYPERCHOLESTEROLEMIA, PURE 02/12/2007  . OBESITY NOS 02/12/2007  . HYPERTENSION, BENIGN ESSENTIAL, CONTROLLED 02/12/2007  . ALLERGIC RHINITIS 02/12/2007  . ASTHMA, CHILDHOOD 02/12/2007  . DEGENERATIVE JOINT DISEASE 02/12/2007    Haze Rushing , PT, DPT  01-Dec-2015, 6:04 PM  [facility] [address] [place], [place], [address] Phone: [phone]   Fax:  [phone]  Name: IVANELLE KEESLER MRN: [id number] Date of Birth: 1960-12-15

## 2015-11-27 ENCOUNTER — Ambulatory Visit: Payer: Commercial Managed Care - HMO

## 2015-11-27 DIAGNOSIS — M545 Low back pain: Secondary | ICD-10-CM

## 2015-11-27 DIAGNOSIS — M6281 Muscle weakness (generalized): Secondary | ICD-10-CM

## 2015-11-27 DIAGNOSIS — R262 Difficulty in walking, not elsewhere classified: Secondary | ICD-10-CM

## 2015-11-27 NOTE — Patient Instructions (Addendum)

## 2015-11-27 NOTE — Therapy (Signed)
[facility] Outpatient Rehabilitation [facility] [address] [place], [place], [address] Phone: [phone]   Fax:  [phone]  Physical Therapy Treatment  Patient Details  Name: Katelyn Howard MRN: [id number] Date of Birth: Feb 02, 1961 Referring Provider: Dr Rodrigo Ran  Encounter Date: 11/27/2015      PT End of Session - 11/27/15 1425    Visit Number 2   Number of Visits 24   Date for PT Re-Evaluation 01/17/16   PT Start Time 0143   PT Stop Time 0235   PT Time Calculation (min) 52 min   Activity Tolerance Patient limited by pain   Behavior During Therapy [facility] for tasks assessed/performed      Past Medical History  Diagnosis Date  . Hypertension   . Diabetes mellitus   . Coronary artery disease   . Asthma   . CHF (congestive heart failure) (HCC)   . Arthritis   . Obesity     Past Surgical History  Procedure Laterality Date  . Eye surgery    . Abdominal hysterectomy      There were no vitals filed for this visit.      Subjective Assessment - 11/27/15 1344    Subjective Nothing different   Currently in Pain? Yes   Pain Score 7    Pain Location Back   Pain Orientation Right;Left;Mid   Pain Descriptors / Indicators Aching;Numbness   Pain Type Chronic pain   Pain Onset More than a month ago   Pain Frequency Constant   Multiple Pain Sites No                         [facility] Adult PT Treatment/Exercise - 11/27/15 1348    Lumbar Exercises: Stretches   Single Knee to Chest Stretch 2 reps;20 seconds   Single Knee to Chest Stretch Limitations assisted with strap and PT   Lower Trunk Rotation 2 reps;30 seconds   Lower Trunk Rotation Limitations with feet apart to emphasize hip rotation    Pelvic Tilt 10 seconds   Pelvic Tilt Limitations 5 sec hold   Lumbar Exercises: Supine   Ab Set 10 reps;5 seconds   AB Set Limitations Transvers abdominus and pelvic floor for HEP   Other Supine Lumbar Exercises Seated she did flexion over  knee x 2 10 sec and trunk rotation x 3 RT and LT 8-10 seconds.    Modalities   Modalities Electrical Stimulation;Moist Heat   Manual Therapy   Manual Therapy Passive ROM;Manual Traction;Joint mobilization;Soft tissue mobilization   Passive ROM RT and LT hip circles with IR andER 30 reps each postion. SLR 30 sec x 2 RT and LT   Manual Traction RT and LT hip with SLR      HMP and electric stim IFC to tolerance x 12 min to lower back          PT Education - 11/27/15 1425    Education provided Yes   Education Details HEP   Person(s) Educated Patient   Methods Explanation;Demonstration;Tactile cues;Verbal cues;Handout   Comprehension Verbalized understanding;Returned demonstration;Need further instruction          PT Short Term Goals - 11/22/15 1748    PT SHORT TERM GOAL #1   Title Pt will be independent with initial HEP for continued strengthening and mobility by 12/22/15    Time 4   Period Weeks   Status New   PT SHORT TERM GOAL #2   Title Pt will be able  to demo posture and body mechanics as it relates to lumbar spine by 12/22/15.    Time 4   Period Weeks   Status New   PT SHORT TERM GOAL #3   Title Lumbar extension AROM improved to 10 degrees and pain - free by 12/22/15.   Time 4   Period Weeks   Status New           PT Long Term Goals - 11/22/15 1749    PT LONG TERM GOAL #1   Title FOTO score will improve from 84% limited to 52% to demo improved function and mobility by 01/16/16.     Time 8   Period Weeks   Status New   PT LONG TERM GOAL #2   Title SLR will improve to 40 degrees without increased pain in order to walk for 30 mins without increased pain by 01/16/16   Time 8   Period Weeks   Status New   PT LONG TERM GOAL #3   Title Hip ABD and flexion  strength will improve to 3+/5   in order to walk for 30 mins without increased pain by 01/16/16.    Time 8   Period Weeks   Status New   PT LONG TERM GOAL #4   Title Hip extension will improve to -10 degrees    in order to stand for 30 mins without increased pain by 01/16/16.    Time 8   Period Weeks   Status New               Plan - 11/27/15 1426    Clinical Impression Statement pain obesity and stiffness limiting her ability to perform exercises for HEP . We will need to review next time.      PT Next Visit Plan Revieew HEp and continue manual and modalities as helpful   Consulted and Agree with Plan of Care Patient      Patient will benefit from skilled therapeutic intervention in order to improve the following deficits and impairments:  Abnormal gait, Decreased activity tolerance, Decreased balance, Decreased mobility, Decreased strength, Postural dysfunction, Impaired flexibility, Improper body mechanics, Pain, Obesity, Increased muscle spasms, Decreased endurance, Decreased range of motion, Difficulty walking  Visit Diagnosis: Midline low back pain, with sciatica presence unspecified  Muscle weakness (generalized)  Difficulty in walking, not elsewhere classified     Problem List Patient Active Problem List   Diagnosis Date Noted  . DM, UNCOMPLICATED, TYPE II, UNCONTROLLED 02/12/2007  . HYPERCHOLESTEROLEMIA, PURE 02/12/2007  . OBESITY NOS 02/12/2007  . HYPERTENSION, BENIGN ESSENTIAL, CONTROLLED 02/12/2007  . ALLERGIC RHINITIS 02/12/2007  . ASTHMA, CHILDHOOD 02/12/2007  . DEGENERATIVE JOINT DISEASE 02/12/2007    Caprice Red PT 11/27/2015, 3:32 PM  [facility] [address] [place], [place], [address] Phone: [phone]   Fax:  [phone]  Name: MARIEKE LUBKE MRN: [id number] Date of Birth: 10-31-1960

## 2015-12-04 ENCOUNTER — Ambulatory Visit: Payer: Commercial Managed Care - HMO | Admitting: Physical Therapy

## 2015-12-11 ENCOUNTER — Ambulatory Visit: Payer: Commercial Managed Care - HMO | Admitting: Physical Therapy

## 2015-12-11 DIAGNOSIS — M6281 Muscle weakness (generalized): Secondary | ICD-10-CM | POA: Diagnosis not present

## 2015-12-11 DIAGNOSIS — M545 Low back pain: Secondary | ICD-10-CM

## 2015-12-11 DIAGNOSIS — R262 Difficulty in walking, not elsewhere classified: Secondary | ICD-10-CM | POA: Diagnosis not present

## 2015-12-12 ENCOUNTER — Ambulatory Visit: Payer: Commercial Managed Care - HMO | Admitting: Physical Therapy

## 2015-12-12 DIAGNOSIS — R262 Difficulty in walking, not elsewhere classified: Secondary | ICD-10-CM | POA: Diagnosis not present

## 2015-12-12 DIAGNOSIS — M6281 Muscle weakness (generalized): Secondary | ICD-10-CM

## 2015-12-12 DIAGNOSIS — M545 Low back pain: Secondary | ICD-10-CM | POA: Diagnosis not present

## 2015-12-12 NOTE — Patient Instructions (Signed)
Quads / HF, Supine    Lie near edge of bed, one leg bent, foot flat on bed. Other leg hanging over edge, relaxed, thigh resting entirely on bed. Bend hanging knee backward keeping thigh in contact with bed. Hold _60__ seconds.  Repeat __1_ times per session. Do __2_ sessions per day.  Copyright  VHI. All rights reserved.

## 2015-12-12 NOTE — Therapy (Signed)
[facility] Outpatient Rehabilitation [facility] [address] [place], [place], [address] Phone: [phone]   Fax:  [phone]  Physical Therapy Treatment  Patient Details  Name: Katelyn Howard MRN: [id number] Date of Birth: 1961/06/28 Referring Provider: Dr Rodrigo Ran  Encounter Date: 12/12/2015      PT End of Session - 12/12/15 0952    Visit Number 4   Number of Visits 24   Date for PT Re-Evaluation 01/17/16   PT Start Time 0930   PT Stop Time 1030   PT Time Calculation (min) 60 min      Past Medical History  Diagnosis Date  . Hypertension   . Diabetes mellitus   . Coronary artery disease   . Asthma   . CHF (congestive heart failure) (HCC)   . Arthritis   . Obesity     Past Surgical History  Procedure Laterality Date  . Eye surgery    . Abdominal hysterectomy      There were no vitals filed for this visit.      Subjective Assessment - 12/12/15 0946    Subjective Still about the same   Currently in Pain? Yes   Pain Score 5    Pain Location Back   Pain Orientation Right;Left;Mid   Pain Radiating Towards Down posterior legs to knees   Aggravating Factors  standing, walking, prolonged sitting.    Pain Relieving Factors lying on side, meds                         [facility] Adult PT Treatment/Exercise - 12/12/15 1003    Lumbar Exercises: Seated   Hip Flexion on Ball Both   Hip Flexion on Ball Limitations on sit fit with alternating bent knee raise, holding ab set, also bilateral and unilateral clams with ab set on sit fit and yellow band around thighs, posterior pelvic tilts on sit-fit 10 x 5 sec with cues for posture and breathing  feet on step    Lumbar Exercises: Sidelying   Clam 10 reps  2 sets.   Hip Abduction 10 reps   Other Sidelying Lumbar Exercises passive hip flexor stretch 2 x 30 sec bilateral, decreased stretch to decrease LBP   Knee/Hip Exercises: Stretches   Hip Flexor Stretch Both;1 rep;60 seconds   Hip  Flexor Stretch Limitations off edge of mat with opposite knee bent   Electrical Stimulation   Electrical Stimulation Location lower back   Electrical Stimulation Action IFC x 15 min   Electrical Stimulation Parameters to tolerance   Electrical Stimulation Goals Pain                PT Education - 12/12/15 1019    Education provided Yes   Education Details Hip flexor stretch   Person(s) Educated Patient   Methods Explanation;Handout   Comprehension Verbalized understanding          PT Short Term Goals - 12/12/15 0839    PT SHORT TERM GOAL #1   Title Pt will be independent with initial HEP for continued strengthening and mobility by 12/22/15    Baseline cues reqd   Time 4   Period Weeks   Status On-going   PT SHORT TERM GOAL #2   Title Pt will be able to demo posture and body mechanics as it relates to lumbar spine by 12/22/15.    Time 4   Period Weeks   Status On-going   PT SHORT TERM GOAL #3  Title Lumbar extension AROM improved to 10 degrees and pain - free by 12/22/15.   Time 4   Period Weeks   Status Unable to assess           PT Long Term Goals - 11/22/15 1749    PT LONG TERM GOAL #1   Title FOTO score will improve from 84% limited to 52% to demo improved function and mobility by 01/16/16.     Time 8   Period Weeks   Status New   PT LONG TERM GOAL #2   Title SLR will improve to 40 degrees without increased pain in order to walk for 30 mins without increased pain by 01/16/16   Time 8   Period Weeks   Status New   PT LONG TERM GOAL #3   Title Hip ABD and flexion  strength will improve to 3+/5   in order to walk for 30 mins without increased pain by 01/16/16.    Time 8   Period Weeks   Status New   PT LONG TERM GOAL #4   Title Hip extension will improve to -10 degrees   in order to stand for 30 mins without increased pain by 01/16/16.    Time 8   Period Weeks   Status New               Plan - 12/12/15 1137    Clinical Impression Statement Pt  with good tolerance to seated sit-fit exercises, continued pain with supine so focused seated and sidelying. Good tolerance to sidelying hip exercises. Added hip flexor stretch to HEP. IFC again per pt request. Pain after therex no change, after IFC alittle better.    PT Next Visit Plan Review hip flexor stretch and HEP, continue seated and sidelying core, manual?      Patient will benefit from skilled therapeutic intervention in order to improve the following deficits and impairments:  Abnormal gait, Decreased activity tolerance, Decreased balance, Decreased mobility, Decreased strength, Postural dysfunction, Impaired flexibility, Improper body mechanics, Pain, Obesity, Increased muscle spasms, Decreased endurance, Decreased range of motion, Difficulty walking  Visit Diagnosis: Midline low back pain, with sciatica presence unspecified  Muscle weakness (generalized)  Difficulty in walking, not elsewhere classified     Problem List Patient Active Problem List   Diagnosis Date Noted  . DM, UNCOMPLICATED, TYPE II, UNCONTROLLED 02/12/2007  . HYPERCHOLESTEROLEMIA, PURE 02/12/2007  . OBESITY NOS 02/12/2007  . HYPERTENSION, BENIGN ESSENTIAL, CONTROLLED 02/12/2007  . ALLERGIC RHINITIS 02/12/2007  . ASTHMA, CHILDHOOD 02/12/2007  . DEGENERATIVE JOINT DISEASE 02/12/2007    Sherrie Mustache, PTA 12/12/2015, 11:39 AM  [facility] [address] [place], [place], [address] Phone: [phone]   Fax:  [phone]  Name: AMILA CALLIES MRN: [id number] Date of Birth: October 10, 1960

## 2015-12-12 NOTE — Therapy (Signed)
[facility] Outpatient Rehabilitation [facility] [address] [place], [place], [address] Phone: [phone]   Fax:  [phone]  Physical Therapy Treatment  Patient Details  Name: Katelyn Howard MRN: [id number] Date of Birth: 08-05-1961 Referring Provider: Dr Rodrigo Ran  Encounter Date: 12/11/2015      PT End of Session - 12/11/15 1537    Visit Number 3   Number of Visits 24   Date for PT Re-Evaluation 01/17/16   PT Start Time 1145   PT Stop Time 1245   PT Time Calculation (min) 60 min      Past Medical History  Diagnosis Date  . Hypertension   . Diabetes mellitus   . Coronary artery disease   . Asthma   . CHF (congestive heart failure) (HCC)   . Arthritis   . Obesity     Past Surgical History  Procedure Laterality Date  . Eye surgery    . Abdominal hysterectomy      There were no vitals filed for this visit.      Subjective Assessment - 12/11/15 1522    Subjective Pt. rated pain as a 4 or 5/10 in her lower back before tx.   Currently in Pain? Yes   Pain Score 4    Pain Location Back                         [facility] Adult PT Treatment/Exercise - 12/12/15 0001    Exercises   Exercises Knee/Hip   Lumbar Exercises: Stretches   Single Knee to Chest Stretch 2 reps;20 seconds   Single Knee to Chest Stretch Limitations passive.   Lower Trunk Rotation 2 reps   Lumbar Exercises: Supine   Ab Set 10 reps   Lumbar Exercises: Sidelying   Clam 10 reps  2 sets.   Other Sidelying Lumbar Exercises Ab set  2 sets. x 10 secs. 10 reps.   Knee/Hip Exercises: Seated   Long Arc Quad AROM;Strengthening;Both;1 set;10 reps  C/O pain due to arthritis.   Other Seated Knee/Hip Exercises calf raises  3x x 20   Marching AROM;Strengthening;2 sets;10 reps   Knee/Hip Exercises: Sidelying   Clams 2x x 10  yellow band on second set. Also did foot up for IR.   Modalities   Modalities Insurance account manager Location lower back   Electrical Stimulation Action IFC x 15   Electrical Stimulation Parameters 15   Electrical Stimulation Goals Pain                  PT Short Term Goals - 12/12/15 0839    PT SHORT TERM GOAL #1   Title Pt will be independent with initial HEP for continued strengthening and mobility by 12/22/15    Baseline cues reqd   Time 4   Period Weeks   Status On-going   PT SHORT TERM GOAL #2   Title Pt will be able to demo posture and body mechanics as it relates to lumbar spine by 12/22/15.    Time 4   Period Weeks   Status On-going   PT SHORT TERM GOAL #3   Title Lumbar extension AROM improved to 10 degrees and pain - free by 12/22/15.   Time 4   Period Weeks   Status Unable to assess           PT Long Term Goals - 11/22/15 1749    PT LONG TERM GOAL #  1   Title FOTO score will improve from 84% limited to 52% to demo improved function and mobility by 01/16/16.     Time 8   Period Weeks   Status New   PT LONG TERM GOAL #2   Title SLR will improve to 40 degrees without increased pain in order to walk for 30 mins without increased pain by 01/16/16   Time 8   Period Weeks   Status New   PT LONG TERM GOAL #3   Title Hip ABD and flexion  strength will improve to 3+/5   in order to walk for 30 mins without increased pain by 01/16/16.    Time 8   Period Weeks   Status New   PT LONG TERM GOAL #4   Title Hip extension will improve to -10 degrees   in order to stand for 30 mins without increased pain by 01/16/16.    Time 8   Period Weeks   Status New               Plan - 12/11/15 1532    Clinical Impression Statement C/O pain during LAQ's because of arthritis. Was uncomfortable in supine so moved to sidelying where she was more comfortable. Claimed that manual traction last time did not make her feel better. Stated that she like e-stim. but her pain did not go down after she finished with it. Pt. rated pain as a 5/10 after tx.   PT  Next Visit Plan Check goals. Review HEP. Continue with modalities as helpful. Progress in clam series as tolerable. Continue with exercises in sidelying unless supine becomes tolerable.      Patient will benefit from skilled therapeutic intervention in order to improve the following deficits and impairments:  Abnormal gait, Decreased activity tolerance, Decreased balance, Decreased mobility, Decreased strength, Postural dysfunction, Impaired flexibility, Improper body mechanics, Pain, Obesity, Increased muscle spasms, Decreased endurance, Decreased range of motion, Difficulty walking  Visit Diagnosis: Midline low back pain, with sciatica presence unspecified  Muscle weakness (generalized)  Difficulty in walking, not elsewhere classified     Problem List Patient Active Problem List   Diagnosis Date Noted  . DM, UNCOMPLICATED, TYPE II, UNCONTROLLED 02/12/2007  . HYPERCHOLESTEROLEMIA, PURE 02/12/2007  . OBESITY NOS 02/12/2007  . HYPERTENSION, BENIGN ESSENTIAL, CONTROLLED 02/12/2007  . ALLERGIC RHINITIS 02/12/2007  . ASTHMA, CHILDHOOD 02/12/2007  . DEGENERATIVE JOINT DISEASE 02/12/2007    Emiliano Dyer, SPTA  Sherrie Mustache 12/12/2015, 9:41 AM  [facility] [address] [place], [place], [address] Phone: [phone]   Fax:  [phone]  Name: NYEISHA GOODALL MRN: [id number] Date of Birth: Apr 29, 1961

## 2015-12-13 ENCOUNTER — Ambulatory Visit: Payer: Commercial Managed Care - HMO | Admitting: Physical Therapy

## 2015-12-14 ENCOUNTER — Encounter: Payer: Medicare HMO | Admitting: Physical Therapy

## 2015-12-17 ENCOUNTER — Ambulatory Visit: Payer: Commercial Managed Care - HMO | Attending: Internal Medicine | Admitting: Physical Therapy

## 2015-12-17 DIAGNOSIS — M6281 Muscle weakness (generalized): Secondary | ICD-10-CM | POA: Diagnosis not present

## 2015-12-17 DIAGNOSIS — M545 Low back pain: Secondary | ICD-10-CM | POA: Diagnosis not present

## 2015-12-17 DIAGNOSIS — R262 Difficulty in walking, not elsewhere classified: Secondary | ICD-10-CM | POA: Diagnosis not present

## 2015-12-17 NOTE — Therapy (Signed)
[facility] Outpatient Rehabilitation [facility] [address] [place], [place], [address] Phone: [phone]   Fax:  [phone]  Physical Therapy Treatment  Patient Details  Name: Katelyn Howard MRN: [id number] Date of Birth: 07/24/1961 Referring Provider: Dr Rodrigo Ran  Encounter Date: 12/17/2015      PT End of Session - 12/17/15 1441    Visit Number 5   Number of Visits 24   Date for PT Re-Evaluation 01/17/16   PT Start Time 1100   PT Stop Time 1200   PT Time Calculation (min) 60 min      Past Medical History  Diagnosis Date  . Hypertension   . Diabetes mellitus   . Coronary artery disease   . Asthma   . CHF (congestive heart failure) (HCC)   . Arthritis   . Obesity     Past Surgical History  Procedure Laterality Date  . Eye surgery    . Abdominal hysterectomy      There were no vitals filed for this visit.      Subjective Assessment - 12/17/15 1313    Subjective Pt. rated pain in knees as a 4/10 before tx. Stated that the e-stim helped last time.                         [facility] Adult PT Treatment/Exercise - 12/17/15 0001    Lumbar Exercises: Seated   Hip Flexion on Ball AROM;Strengthening;Both;10 reps   Hip Flexion on Ball Limitations on sit fit with alt. bent knee raises, ab. sets., posterior pelvic titlts 10 x 5secs.   Lumbar Exercises: Sidelying   Clam 10 reps  2 sets. yellow.   Knee/Hip Exercises: Stretches   Hip Flexor Stretch Right;2 reps;30 seconds   Hip Flexor Stretch Limitations off edge of mat with opposite knee bent   Knee/Hip Exercises: Aerobic   Nustep L5 x 5 min.   Knee/Hip Exercises: Seated   Long Arc Quad AROM;Strengthening;Both;2 sets;10 reps   Long Arc Quad Weight 3 lbs.   Long [employer] Limitations Cues to slow down.   Other Seated Knee/Hip Exercises Calf raises  2x x 15   Marching AROM;Strengthening;Both;2 sets;10 reps  on sit fit.   Programme researcher, broadcasting/film/video Location  lower back   Electrical Stimulation Action IFC x 15   Electrical Stimulation Parameters to tolerance   Electrical Stimulation Goals Pain                  PT Short Term Goals - 12/12/15 0839    PT SHORT TERM GOAL #1   Title Pt will be independent with initial HEP for continued strengthening and mobility by 12/22/15    Baseline cues reqd   Time 4   Period Weeks   Status On-going   PT SHORT TERM GOAL #2   Title Pt will be able to demo posture and body mechanics as it relates to lumbar spine by 12/22/15.    Time 4   Period Weeks   Status On-going   PT SHORT TERM GOAL #3   Title Lumbar extension AROM improved to 10 degrees and pain - free by 12/22/15.   Time 4   Period Weeks   Status Unable to assess           PT Long Term Goals - 11/22/15 1749    PT LONG TERM GOAL #1   Title FOTO score will improve from 84% limited to 52% to demo improved  function and mobility by 01/16/16.     Time 8   Period Weeks   Status New   PT LONG TERM GOAL #2   Title SLR will improve to 40 degrees without increased pain in order to walk for 30 mins without increased pain by 01/16/16   Time 8   Period Weeks   Status New   PT LONG TERM GOAL #3   Title Hip ABD and flexion  strength will improve to 3+/5   in order to walk for 30 mins without increased pain by 01/16/16.    Time 8   Period Weeks   Status New   PT LONG TERM GOAL #4   Title Hip extension will improve to -10 degrees   in order to stand for 30 mins without increased pain by 01/16/16.    Time 8   Period Weeks   Status New               Plan - 12/17/15 1316    Clinical Impression Statement Pt. claims to be compliant with HEP.  Reviewed hip flexor stretch with her. C/O pain in hip during clams in right sidelying. Is doing a better job of drawing in abs without cueing.   PT Next Visit Plan Continue core and hip strengthening.      Patient will benefit from skilled therapeutic intervention in order to improve the following  deficits and impairments:  Abnormal gait, Decreased activity tolerance, Decreased balance, Decreased mobility, Decreased strength, Postural dysfunction, Impaired flexibility, Improper body mechanics, Pain, Obesity, Increased muscle spasms, Decreased endurance, Decreased range of motion, Difficulty walking  Visit Diagnosis: Midline low back pain, with sciatica presence unspecified  Muscle weakness (generalized)  Difficulty in walking, not elsewhere classified     Problem List Patient Active Problem List   Diagnosis Date Noted  . DM, UNCOMPLICATED, TYPE II, UNCONTROLLED 02/12/2007  . HYPERCHOLESTEROLEMIA, PURE 02/12/2007  . OBESITY NOS 02/12/2007  . HYPERTENSION, BENIGN ESSENTIAL, CONTROLLED 02/12/2007  . ALLERGIC RHINITIS 02/12/2007  . ASTHMA, CHILDHOOD 02/12/2007  . DEGENERATIVE JOINT DISEASE 02/12/2007    Emiliano Dyer, SPTA 12/17/2015, 2:43 PM  [facility] [address] [place], [place], [address] Phone: [phone]   Fax:  [phone]  Name: Katelyn Howard MRN: [id number] Date of Birth: [birth year]

## 2015-12-19 ENCOUNTER — Encounter: Payer: Medicare HMO | Admitting: Physical Therapy

## 2015-12-20 ENCOUNTER — Ambulatory Visit: Payer: Commercial Managed Care - HMO | Admitting: Physical Therapy

## 2015-12-20 DIAGNOSIS — M545 Low back pain: Secondary | ICD-10-CM | POA: Diagnosis not present

## 2015-12-20 DIAGNOSIS — R262 Difficulty in walking, not elsewhere classified: Secondary | ICD-10-CM

## 2015-12-20 DIAGNOSIS — M6281 Muscle weakness (generalized): Secondary | ICD-10-CM

## 2015-12-20 NOTE — Patient Instructions (Signed)
  Abduction: Side Leg Lift (Eccentric) - Side-Lying   Lie on side. Lift top leg slightly higher than shoulder level. Keep top leg straight with body, toes pointing forward. Slowly lower for 3-5 seconds. _8__ reps per set, _2__ sets per day, __7_ days per week.

## 2015-12-20 NOTE — Therapy (Signed)
[facility] [address] [place], [place], [address] Phone: [phone]   Fax:  [phone]  Physical Therapy Treatment  Patient Details  Name: Katelyn Howard MRN: [id number] Date of Birth: 08/13/1961 Referring Provider: Dr Mark Perini  Encounter Date: 12/20/2015      PT End of Session - 12/20/15 1454    Visit Number 6   Number of Visits 24   Date for PT Re-Evaluation 01/17/16   PT Start Time 0130   PT Stop Time 0230   PT Time Calculation (min) 60 min      Past Medical History  Diagnosis Date  . Hypertension   . Diabetes mellitus   . Coronary artery disease   . Asthma   . CHF (congestive heart failure) (HCC)   . Arthritis   . Obesity     Past Surgical History  Procedure Laterality Date  . Eye surgery    . Abdominal hysterectomy      There were no vitals filed for this visit.      Subjective Assessment - 12/20/15 1426    Subjective Pt. rated pain in knees as a 4/10 before tx.   Currently in Pain? Yes   Pain Score 4    Pain Location Knee            [facility] PT Assessment - 12/20/15 0001    Strength   Right Hip ABduction 3/5   Left Hip ABduction 3+/5   Flexibility   Soft Tissue Assessment /Muscle Length yes   Hamstrings 30 degrees with SLR bilateral                     [facility] Adult PT Treatment/Exercise - 12/20/15 0001    Lumbar Exercises: Stretches   Passive Hamstring Stretch 3 reps;30 seconds   Passive Hamstring Stretch Limitations sidelying   Lumbar Exercises: Supine   Ab Set 5 reps  10 secs.   Other Supine Lumbar Exercises deadbugs.  2x x 10. cues for tight abs. 4" step.   Lumbar Exercises: Sidelying   Clam 10 reps  3 sets.   Hip Abduction 10 reps  2 sets. x 8 reps.   Knee/Hip Exercises: Aerobic   Nustep L5 x 5 min.   Knee/Hip Exercises: Seated   Other Seated Knee/Hip Exercises Clams.  2x x 10. pain in lower back down to buttocks.   Marching AROM;Strengthening;Both;10  reps   Marching Limitations on sit fit, feet on 6" step.                PT Education - 12/20/15 1454    Education provided Yes   Education Details sidelying hip abduction.   Methods Explanation;Demonstration   Comprehension Verbalized understanding;Returned demonstration          PT Short Term Goals - 12/12/15 0839    PT SHORT TERM GOAL #1   Title Pt will be independent with initial HEP for continued strengthening and mobility by 12/22/15    Baseline cues reqd   Time 4   Period Weeks   Status On-going   PT SHORT TERM GOAL #2   Title Pt will be able to demo posture and body mechanics as it relates to lumbar spine by 12/22/15.    Time 4   Period Weeks   Status On-going   PT SHORT TERM GOAL #3   Title Lumbar extension AROM improved to 10 degrees and pain - free by 12/22/15.   Time 4   Period Weeks     Status Unable to assess           PT Long Term Goals - 12/20/15 1732    PT LONG TERM GOAL #1   Title FOTO score will improve from 84% limited to 52% to demo improved function and mobility by 01/16/16.     Time 8   Period Weeks   Status On-going   PT LONG TERM GOAL #2   Title SLR will improve to 40 degrees without increased pain in order to walk for 30 mins without increased pain by 01/16/16   Time 8   Period Weeks   Status On-going   PT LONG TERM GOAL #3   Title Hip ABD and flexion  strength will improve to 3+/5   in order to walk for 30 mins without increased pain by 01/16/16.    Time 8   Period Weeks   Status Partially Met   PT LONG TERM GOAL #4   Title Hip extension will improve to -10 degrees   in order to stand for 30 mins without increased pain by 01/16/16.    Time 8   Period Weeks   Status On-going               Plan - 12/20/15 1443    Clinical Impression Statement Pt. stated that she has been doing her HEP. Was given new home exercise of sidelying hip abduction. C/O pain in hip during seated clams. VC's to draw abs. in. LTG #3 partially met, as she  was able to reach a 3+/5 on right side for abduction. Stated that e-stim. helped last time. Was able to do passive hip flexor stretch in sidelying with minimal pain.   PT Next Visit Plan Review new home exercise, continue core and hip strengthening, hip flexor stretching      Patient will benefit from skilled therapeutic intervention in order to improve the following deficits and impairments:     Visit Diagnosis: Midline low back pain, with sciatica presence unspecified  Muscle weakness (generalized)  Difficulty in walking, not elsewhere classified  During this treatment session, the therapist was present, participating in and directing the treatment.   Problem List Patient Active Problem List   Diagnosis Date Noted  . DM, UNCOMPLICATED, TYPE II, UNCONTROLLED 02/12/2007  . HYPERCHOLESTEROLEMIA, PURE 02/12/2007  . OBESITY NOS 02/12/2007  . HYPERTENSION, BENIGN ESSENTIAL, CONTROLLED 02/12/2007  . ALLERGIC RHINITIS 02/12/2007  . ASTHMA, CHILDHOOD 02/12/2007  . DEGENERATIVE JOINT DISEASE 02/12/2007   Tendler, Andrew, SPTA  [place], Jessica McGee, PTA 12/20/2015, 5:33 PM  [facility] [address] [place], [place], [address] Phone: [phone]   Fax:  [phone]  Name: Katelyn Howard MRN: [id number] Date of Birth: 10/09/1960     

## 2015-12-21 ENCOUNTER — Ambulatory Visit: Payer: Commercial Managed Care - HMO | Admitting: Physical Therapy

## 2015-12-21 DIAGNOSIS — M6281 Muscle weakness (generalized): Secondary | ICD-10-CM

## 2015-12-21 DIAGNOSIS — R262 Difficulty in walking, not elsewhere classified: Secondary | ICD-10-CM

## 2015-12-21 DIAGNOSIS — M545 Low back pain: Secondary | ICD-10-CM | POA: Diagnosis not present

## 2015-12-21 NOTE — Therapy (Signed)
[place], [place], [address] Phone: [phone]   Fax:  [phone]  Physical Therapy Treatment  Patient Details  Name: Katelyn Howard MRN: [id number] Date of Birth: 1961-03-31 Referring Provider: Dr Crist Infante  Encounter Date: 12/21/2015      PT End of Session - 12/21/15 1038    Visit Number 7   Number of Visits 24   Date for PT Re-Evaluation 01/17/16   PT Start Time 6767   PT Stop Time 1120   PT Time Calculation (min) 45 min      Past Medical History  Diagnosis Date  . Hypertension   . Diabetes mellitus   . Coronary artery disease   . Asthma   . CHF (congestive heart failure) ([place])   . Arthritis   . Obesity     Past Surgical History  Procedure Laterality Date  . Eye surgery    . Abdominal hysterectomy      There were no vitals filed for this visit.      Subjective Assessment - 12/21/15 1052    Subjective I am hurting today.    Currently in Pain? Yes   Pain Score 5    Pain Location Back   Pain Radiating Towards left leg                         [facility] Adult PT Treatment/Exercise - 12/21/15 0001    Lumbar Exercises: Standing   Other Standing Lumbar Exercises posterior pelvic tilts against wall- difficulty coordinating movements.    Lumbar Exercises: Prone   Other Prone Lumbar Exercises Prone lying over 2 pillows dueing STW x 15 minutes   Lumbar Exercises: Quadruped   [place] Horse 10 reps   Knee/Hip Exercises: Aerobic   Nustep L5 x 5 min.   Moist Heat Therapy   Number Minutes Moist Heat 15 Minutes   Moist Heat Location Lumbar Spine   Electrical Stimulation   Electrical Stimulation Location lower back   Electrical Stimulation Action IFCx 15    Electrical Stimulation Parameters to tolerance   Electrical Stimulation Goals Pain   Manual Therapy   Manual Therapy Soft tissue mobilization   Soft tissue mobilization foam roller used to decrease tension in  lower back musculature prone over pillows                PT Education - 12/20/15 1454    Education provided Yes   Education Details sidelying hip abduction.   Methods Explanation;Demonstration   Comprehension Verbalized understanding;Returned demonstration          PT Short Term Goals - 12/12/15 0839    PT SHORT TERM GOAL #1   Title Pt will be independent with initial HEP for continued strengthening and mobility by 12/22/15    Baseline cues reqd   Time 4   Period Weeks   Status On-going   PT SHORT TERM GOAL #2   Title Pt will be able to demo posture and body mechanics as it relates to lumbar spine by 12/22/15.    Time 4   Period Weeks   Status On-going   PT SHORT TERM GOAL #3   Title Lumbar extension AROM improved to 10 degrees and pain - free by 12/22/15.   Time 4   Period Weeks   Status Unable to assess           PT Long Term Goals - 12/20/15 1732    PT LONG  TERM GOAL #1   Title FOTO score will improve from 84% limited to 52% to demo improved function and mobility by 01/16/16.     Time 8   Period Weeks   Status On-going   PT LONG TERM GOAL #2   Title SLR will improve to 40 degrees without increased pain in order to walk for 30 mins without increased pain by 01/16/16   Time 8   Period Weeks   Status On-going   PT LONG TERM GOAL #3   Title Hip ABD and flexion  strength will improve to 3+/5   in order to walk for 30 mins without increased pain by 01/16/16.    Time 8   Period Weeks   Status Partially Met   PT LONG TERM GOAL #4   Title Hip extension will improve to -10 degrees   in order to stand for 30 mins without increased pain by 01/16/16.    Time 8   Period Weeks   Status On-going               Plan - 12/21/15 1052    Clinical Impression Statement Pt reports increased pain after yesterdays treatment. She attempted more sidelying hip abduction at home last night. This morning her low back is hurting more. USed session to decrease pain via gentle  prone lying with pillows for hip flexor stretch. Icreased back pain if on elbows. Instructed pt to try prone lying with pillows at home. STW using massage roler to lumbar area while prone on pillows. Trial of posterior pelvic tilts against wall-unable to coordinate movement. Posterior pelvic tilts in quadruped with much better form. IFC with HMP to decrease pain post session. Pt reports 3-4/10 pain after.    PT Next Visit Plan continue to work on pelvic mobility due to increased lordosis. Prone lying progressing as able, hip and core strengthening as tolerated. CHECK GOALS      Patient will benefit from skilled therapeutic intervention in order to improve the following deficits and impairments:  Abnormal gait, Decreased activity tolerance, Decreased balance, Decreased mobility, Decreased strength, Postural dysfunction, Impaired flexibility, Improper body mechanics, Pain, Obesity, Increased muscle spasms, Decreased endurance, Decreased range of motion, Difficulty walking  Visit Diagnosis: Midline low back pain, with sciatica presence unspecified  Muscle weakness (generalized)  Difficulty in walking, not elsewhere classified     Problem List Patient Active Problem List   Diagnosis Date Noted  . DM, UNCOMPLICATED, TYPE II, UNCONTROLLED 02/12/2007  . HYPERCHOLESTEROLEMIA, PURE 02/12/2007  . OBESITY NOS 02/12/2007  . HYPERTENSION, BENIGN ESSENTIAL, CONTROLLED 02/12/2007  . ALLERGIC RHINITIS 02/12/2007  . ASTHMA, CHILDHOOD 02/12/2007  . DEGENERATIVE JOINT DISEASE 02/12/2007    Dorene Ar, PTA 12/21/2015, 11:30 AM  [place], [place], [address] Phone: [phone]   Fax:  [phone]  Name: Katelyn Howard MRN: [id number] Date of Birth: [birth year]

## 2015-12-24 ENCOUNTER — Ambulatory Visit: Payer: Commercial Managed Care - HMO | Admitting: Physical Therapy

## 2015-12-24 DIAGNOSIS — M6281 Muscle weakness (generalized): Secondary | ICD-10-CM

## 2015-12-24 DIAGNOSIS — R262 Difficulty in walking, not elsewhere classified: Secondary | ICD-10-CM

## 2015-12-24 DIAGNOSIS — M545 Low back pain: Secondary | ICD-10-CM | POA: Diagnosis not present

## 2015-12-24 NOTE — Therapy (Signed)
[place], [place], [address] Phone: [phone]   Fax:  [phone]  Physical Therapy Treatment  Patient Details  Name: Katelyn Howard MRN: [id number] Date of Birth: 08-20-1960 Referring Provider: Dr Crist Infante  Encounter Date: 12/24/2015      PT End of Session - 12/24/15 1429    Visit Number 8   Number of Visits 24   Date for PT Re-Evaluation 01/17/16   PT Start Time 0115   PT Stop Time 0215   PT Time Calculation (min) 60 min      Past Medical History  Diagnosis Date  . Hypertension   . Diabetes mellitus   . Coronary artery disease   . Asthma   . CHF (congestive heart failure) ([place])   . Arthritis   . Obesity     Past Surgical History  Procedure Laterality Date  . Eye surgery    . Abdominal hysterectomy      There were no vitals filed for this visit.      Subjective Assessment - 12/24/15 1352    Subjective Still a little bit of pain   Currently in Pain? Yes   Pain Score 6    Pain Location Back   Pain Orientation Right;Left;Mid   Pain Descriptors / Indicators Aching            [facility] PT Assessment - 12/24/15 0001    Strength   Right Hip Flexion 3+/5   Left Hip Flexion 3+/5   Right Knee Flexion 4-/5   Right Knee Extension 4/5   Left Knee Flexion 4-/5   Left Knee Extension 4-/5                     [facility] Adult PT Treatment/Exercise - 12/24/15 0001    Lumbar Exercises: Stretches   Pelvic Tilt 10 seconds;5 reps   Lumbar Exercises: Supine   Ab Set 5 reps  10 secs.   Clam 20 reps   Clam Limitations with raw in    Bent Knee Raise 10 reps   Bent Knee Raise Limitations with draw in   Bridge 10 reps  increased pain   Bridge Limitations tried with knees on ball - painful so disc.    Lumbar Exercises: Sidelying   Hip Abduction 10 reps  2 sets   Hip Abduction Weights (lbs) bilateral   Knee/Hip Exercises: Stretches   Hip Flexor Stretch Right;2 reps;30  seconds;3 reps   Hip Flexor Stretch Limitations off edge of mat with opposite knee bent using strap    Knee/Hip Exercises: Aerobic   Nustep L6 x 6 min.   Moist Heat Therapy   Number Minutes Moist Heat 15 Minutes   Moist Heat Location Lumbar Spine   Electrical Stimulation   Electrical Stimulation Location lower back   Electrical Stimulation Action IFC x 15 min   Electrical Stimulation Parameters 23   Electrical Stimulation Goals Pain                  PT Short Term Goals - 12/12/15 0839    PT SHORT TERM GOAL #1   Title Pt will be independent with initial HEP for continued strengthening and mobility by 12/22/15    Baseline cues reqd   Time 4   Period Weeks   Status On-going   PT SHORT TERM GOAL #2   Title Pt will be able to demo posture and body mechanics as it relates to lumbar spine by 12/22/15.  Time 4   Period Weeks   Status On-going   PT SHORT TERM GOAL #3   Title Lumbar extension AROM improved to 10 degrees and pain - free by 12/22/15.   Time 4   Period Weeks   Status Unable to assess           PT Long Term Goals - 12/20/15 1732    PT LONG TERM GOAL #1   Title FOTO score will improve from 84% limited to 52% to demo improved function and mobility by 01/16/16.     Time 8   Period Weeks   Status On-going   PT LONG TERM GOAL #2   Title SLR will improve to 40 degrees without increased pain in order to walk for 30 mins without increased pain by 01/16/16   Time 8   Period Weeks   Status On-going   PT LONG TERM GOAL #3   Title Hip ABD and flexion  strength will improve to 3+/5   in order to walk for 30 mins without increased pain by 01/16/16.    Time 8   Period Weeks   Status Partially Met   PT LONG TERM GOAL #4   Title Hip extension will improve to -10 degrees   in order to stand for 30 mins without increased pain by 01/16/16.    Time 8   Period Weeks   Status On-going               Plan - 12/24/15 1422    Clinical Impression Statement Pt reports  she can stand and walk a little bit longer before increased leg and back pain. Pt demonstrates increased lordosis in standing. Trials of posterior pelvic tilts on wall with better tolerance today. Pt c/ o increased leg pain after. Worked on supine posterior pelvic tilts and abdominal draw ins with pt demonstrating improved toelrance to supine/hooklying exercises.Hip flexion slightly improved and pt hamstring strength 4-/5 bilateral. Bridges increase pain in lumbar and glutes.     PT Next Visit Plan continue to work on posterior pelvis tilts/abdominal strength  ? Prone lying progressing as able, hip and core strengthening as tolerated. CHECK GOALS/FOTO      Patient will benefit from skilled therapeutic intervention in order to improve the following deficits and impairments:  Abnormal gait, Decreased activity tolerance, Decreased balance, Decreased mobility, Decreased strength, Postural dysfunction, Impaired flexibility, Improper body mechanics, Pain, Obesity, Increased muscle spasms, Decreased endurance, Decreased range of motion, Difficulty walking  Visit Diagnosis: Midline low back pain, with sciatica presence unspecified  Muscle weakness (generalized)  Difficulty in walking, not elsewhere classified     Problem List Patient Active Problem List   Diagnosis Date Noted  . DM, UNCOMPLICATED, TYPE II, UNCONTROLLED 02/12/2007  . HYPERCHOLESTEROLEMIA, PURE 02/12/2007  . OBESITY NOS 02/12/2007  . HYPERTENSION, BENIGN ESSENTIAL, CONTROLLED 02/12/2007  . ALLERGIC RHINITIS 02/12/2007  . ASTHMA, CHILDHOOD 02/12/2007  . DEGENERATIVE JOINT DISEASE 02/12/2007    Dorene Ar, PTA 12/24/2015, 2:30 PM  [facility] [address] [place], [place], [address] Phone: [phone]   Fax:  [phone]  Name: Katelyn Howard MRN: [id number] Date of Birth: 1960-09-20

## 2015-12-27 ENCOUNTER — Ambulatory Visit: Payer: Commercial Managed Care - HMO | Admitting: Physical Therapy

## 2015-12-27 ENCOUNTER — Encounter: Payer: Self-pay | Admitting: Physical Therapy

## 2015-12-27 DIAGNOSIS — R262 Difficulty in walking, not elsewhere classified: Secondary | ICD-10-CM

## 2015-12-27 DIAGNOSIS — M6281 Muscle weakness (generalized): Secondary | ICD-10-CM

## 2015-12-27 DIAGNOSIS — M545 Low back pain: Secondary | ICD-10-CM

## 2015-12-27 NOTE — Therapy (Signed)
[place], [place], [address] Phone: [phone]   Fax:  [phone]  Physical Therapy Treatment  Patient Details  Name: Katelyn Howard MRN: [id number] Date of Birth: October 29, 1960 Referring Provider: Dr Crist Infante  Encounter Date: 12/27/2015      PT End of Session - 12/27/15 1506    Visit Number 9   Number of Visits 24   Date for PT Re-Evaluation 01/17/16   PT Start Time 5638   PT Stop Time 1520   PT Time Calculation (min) 63 min   Activity Tolerance Patient tolerated treatment well   Behavior During Therapy [facility] for tasks assessed/performed      Past Medical History  Diagnosis Date  . Hypertension   . Diabetes mellitus   . Coronary artery disease   . Asthma   . CHF (congestive heart failure) ([place])   . Arthritis   . Obesity     Past Surgical History  Procedure Laterality Date  . Eye surgery    . Abdominal hysterectomy      There were no vitals filed for this visit.      Subjective Assessment - 12/27/15 1420    Subjective Not having much pain in back today, just a little in her legs.   How long can you sit comfortably? 30 min   How long can you stand comfortably? 10 min   How long can you walk comfortably? 10 min   Diagnostic tests MRI   Patient Stated Goals return to gym    Currently in Pain? Yes   Pain Score 3    Pain Location Back                         [facility] Adult PT Treatment/Exercise - 12/27/15 0001    Lumbar Exercises: Supine   Ab Set 20 reps   Bridge 10 reps   Bridge Limitations maint DF, low amplitude   Large Ball Abdominal Isometric 10 reps;5 seconds   Lumbar Exercises: Prone   Other Prone Lumbar Exercises prone press ups x15   Other Prone Lumbar Exercises prone glut sets x20   Knee/Hip Exercises: Aerobic   Nustep L6 x 6 min.   Knee/Hip Exercises: Prone   Hamstring Curl 20 reps   Electrical Stimulation   Electrical Stimulation Location lower  back   Electrical Stimulation Action IFC 15 min   Electrical Stimulation Parameters to tolerance   Electrical Stimulation Goals Pain                PT Education - 12/27/15 1508    Education provided Yes   Education Details progression toward goals, water walking, exercise form/rationale   Person(s) Educated Patient   Methods Explanation;Tactile cues;Verbal cues   Comprehension Verbalized understanding;Verbal cues required;Tactile cues required;Need further instruction          PT Short Term Goals - 12/12/15 0839    PT SHORT TERM GOAL #1   Title Pt will be independent with initial HEP for continued strengthening and mobility by 12/22/15    Baseline cues reqd   Time 4   Period Weeks   Status On-going   PT SHORT TERM GOAL #2   Title Pt will be able to demo posture and body mechanics as it relates to lumbar spine by 12/22/15.    Time 4   Period Weeks   Status On-going   PT SHORT TERM GOAL #3   Title Lumbar extension AROM  improved to 10 degrees and pain - free by 12/22/15.   Time 4   Period Weeks   Status Unable to assess           PT Long Term Goals - 11-Jan-2016 1442    PT LONG TERM GOAL #1   Title FOTO score will improve from 84% limited to 52% to demo improved function and mobility by 01/16/16.     Baseline 68% limited on 01-11-2023   Time 8   Period Weeks   Status On-going   PT LONG TERM GOAL #2   Title SLR will improve to 40 degrees without increased pain in order to walk for 30 mins without increased pain by 01/16/16   Baseline LLE 30 deg, R 45. Cont pain on L. Reports ability to walk about 10 min   Time 8   Period Weeks   Status Partially Met   PT LONG TERM GOAL #3   Title Hip ABD and flexion  strength will improve to 3+/5   in order to walk for 30 mins without increased pain by 01/16/16.    Baseline hip flexion 4-/5, pain on L; R hip abd 4/5, L hip abd 3/5 with pain   Time 8   Period Weeks   Status Partially Met   PT LONG TERM GOAL #4   Title Hip extension will  improve to -10 degrees   in order to stand for 30 mins without increased pain by 01/16/16.    Baseline L-5, R -9 pain bilat   Time 8   Period Weeks   Status Partially Met               Plan - 2016/01/11 1509    Clinical Impression Statement Pt goals updated today and is maing progress toward achieving. Pt cont to report difficulty with sitting, standing or walking for extended periods but is able to do longer. Cont to lack appropriate ROM and strength required for walking and will continue to benefit from skilled PT to improve strength, ROM and coordination of contractions to meet set long term goals.    PT Treatment/Interventions ADLs/Self Care Home Management;Cryotherapy;Electrical Stimulation;Moist Heat;Traction;Therapeutic activities;Therapeutic exercise;Balance training;Functional mobility training;Stair training;Gait training;Ultrasound;Neuromuscular re-education;Cognitive remediation;Patient/family education;Manual techniques;Taping;Energy conservation;Dry needling;Passive range of motion   PT Next Visit Plan incoorporate extremity motions to core contractions    PT Home Exercise Plan water walking   Consulted and Agree with Plan of Care Patient      Patient will benefit from skilled therapeutic intervention in order to improve the following deficits and impairments:  Abnormal gait, Decreased activity tolerance, Decreased balance, Decreased mobility, Decreased strength, Postural dysfunction, Impaired flexibility, Improper body mechanics, Pain, Obesity, Increased muscle spasms, Decreased endurance, Decreased range of motion, Difficulty walking  Visit Diagnosis: Midline low back pain, with sciatica presence unspecified  Muscle weakness (generalized)  Difficulty in walking, not elsewhere classified       G-Codes - 01/11/2016 1514    Functional Assessment Tool Used FOTO and clinical judgement    Functional Limitation Mobility: Walking and moving around   Mobility: Walking and  Moving Around Current Status [phone]) At least 60 percent but less than 80 percent impaired, limited or restricted   Mobility: Walking and Moving Around Goal Status [phone]) At least 40 percent but less than 60 percent impaired, limited or restricted      Problem List Patient Active Problem List   Diagnosis Date Noted  . DM, UNCOMPLICATED, TYPE II, UNCONTROLLED 02/12/2007  . HYPERCHOLESTEROLEMIA, PURE 02/12/2007  .  OBESITY NOS 02/12/2007  . HYPERTENSION, BENIGN ESSENTIAL, CONTROLLED 02/12/2007  . ALLERGIC RHINITIS 02/12/2007  . ASTHMA, CHILDHOOD 02/12/2007  . DEGENERATIVE JOINT DISEASE 02/12/2007   Akira Adelsberger C. Camryn Quesinberry PT, DPT 12/27/2015 3:24 PM   [facility] [address] [place], [place], [address] Phone: [phone]   Fax:  [phone]  Name: Katelyn Howard MRN: [id number] Date of Birth: 1960-11-13

## 2015-12-31 ENCOUNTER — Ambulatory Visit: Payer: Commercial Managed Care - HMO | Admitting: Physical Therapy

## 2016-01-02 ENCOUNTER — Encounter: Payer: Medicare HMO | Admitting: Physical Therapy

## 2016-01-03 ENCOUNTER — Ambulatory Visit (INDEPENDENT_AMBULATORY_CARE_PROVIDER_SITE_OTHER): Payer: Commercial Managed Care - HMO

## 2016-01-03 ENCOUNTER — Ambulatory Visit: Payer: Commercial Managed Care - HMO | Admitting: Physical Therapy

## 2016-01-03 ENCOUNTER — Encounter (HOSPITAL_COMMUNITY): Payer: Self-pay | Admitting: Emergency Medicine

## 2016-01-03 ENCOUNTER — Ambulatory Visit (HOSPITAL_COMMUNITY)
Admission: EM | Admit: 2016-01-03 | Discharge: 2016-01-03 | Disposition: A | Discharge status: Home / Self Care | Payer: Commercial Managed Care - HMO | Attending: Emergency Medicine | Admitting: Emergency Medicine

## 2016-01-03 DIAGNOSIS — R05 Cough: Secondary | ICD-10-CM | POA: Diagnosis not present

## 2016-01-03 DIAGNOSIS — M545 Low back pain: Secondary | ICD-10-CM | POA: Diagnosis not present

## 2016-01-03 DIAGNOSIS — R262 Difficulty in walking, not elsewhere classified: Secondary | ICD-10-CM | POA: Diagnosis not present

## 2016-01-03 DIAGNOSIS — M6281 Muscle weakness (generalized): Secondary | ICD-10-CM

## 2016-01-03 DIAGNOSIS — J45901 Unspecified asthma with (acute) exacerbation: Secondary | ICD-10-CM

## 2016-01-03 MED ORDER — PREDNISONE 10 MG PO TABS: ORAL_TABLET | ORAL | Status: DC

## 2016-01-03 MED ORDER — AZITHROMYCIN 250 MG PO TABS: ORAL_TABLET | ORAL | Status: DC

## 2016-01-03 MED ORDER — ALBUTEROL SULFATE (2.5 MG/3ML) 0.083% IN NEBU
INHALATION_SOLUTION | RESPIRATORY_TRACT | Status: AC
  Filled 2016-01-03: qty 3

## 2016-01-03 MED ORDER — ALBUTEROL SULFATE (2.5 MG/3ML) 0.083% IN NEBU
2.5000 mg | INHALATION_SOLUTION | Freq: Once | RESPIRATORY_TRACT | Status: AC
  Administered 2016-01-03: 2.5 mg via RESPIRATORY_TRACT

## 2016-01-03 NOTE — Discharge Instructions (Signed)
Bronchospasm, Adult A bronchospasm is a spasm or tightening of the airways going into the lungs. During a bronchospasm breathing becomes more difficult because the airways get smaller. When this happens there can be coughing, a whistling sound when breathing (wheezing), and difficulty breathing. Bronchospasm is often associated with asthma, but not all patients who experience a bronchospasm have asthma. CAUSES  A bronchospasm is caused by inflammation or irritation of the airways. The inflammation or irritation may be triggered by:   Allergies (such as to animals, pollen, food, or mold). Allergens that cause bronchospasm may cause wheezing immediately after exposure or many hours later.   Infection. Viral infections are believed to be the most common cause of bronchospasm.   Exercise.   Irritants (such as pollution, cigarette smoke, strong odors, aerosol sprays, and paint fumes).   Weather changes. Winds increase molds and pollens in the air. Rain refreshes the air by washing irritants out. Cold air may cause inflammation.   Stress and emotional upset.  SIGNS AND SYMPTOMS   Wheezing.   Excessive nighttime coughing.   Frequent or severe coughing with a simple cold.   Chest tightness.   Shortness of breath.  DIAGNOSIS  Bronchospasm is usually diagnosed through a history and physical exam. Tests, such as chest X-rays, are sometimes done to look for other conditions. TREATMENT   Inhaled medicines can be given to open up your airways and help you breathe. The medicines can be given using either an inhaler or a nebulizer machine.  Corticosteroid medicines may be given for severe bronchospasm, usually when it is associated with asthma. HOME CARE INSTRUCTIONS   Always have a plan prepared for seeking medical care. Know when to call your health care provider and local emergency services (911 in the [place] Know where you can access local emergency care.  Only take medicines as  directed by your health care provider.  If you were prescribed an inhaler or nebulizer machine, ask your health care provider to explain how to use it correctly. Always use a spacer with your inhaler if you were given one.  It is necessary to remain calm during an attack. Try to relax and breathe more slowly.  Control your home environment in the following ways:   Change your heating and air conditioning filter at least once a month.   Limit your use of fireplaces and wood stoves.  Do not smoke and do not allow smoking in your home.   Avoid exposure to perfumes and fragrances.   Get rid of pests (such as roaches and mice) and their droppings.   Throw away plants if you see mold on them.   Keep your house clean and dust free.   Replace carpet with wood, tile, or vinyl flooring. Carpet can trap dander and dust.   Use allergy-proof pillows, mattress covers, and box spring covers.   Wash bed sheets and blankets every week in hot water and dry them in a dryer.   Use blankets that are made of polyester or cotton.   Wash hands frequently. SEEK MEDICAL CARE IF:   You have muscle aches.   You have chest pain.   The sputum changes from clear or white to yellow, green, gray, or bloody.   The sputum you cough up gets thicker.   There are problems that may be related to the medicine you are given, such as a rash, itching, swelling, or trouble breathing.  SEEK IMMEDIATE MEDICAL CARE IF:   You have worsening wheezing and coughing  even after taking your prescribed medicines.   You have increased difficulty breathing.   You develop severe chest pain. MAKE SURE YOU:   Understand these instructions.  Will watch your condition.  Will get help right away if you are not doing well or get worse.   This information is not intended to replace advice given to you by your health care provider. Make sure you discuss any questions you have with your health care  provider.   Document Released: 08/07/2003 Document Revised: 08/25/2014 Document Reviewed: 01/24/2013 Elsevier Interactive Patient Education 2016 Else[employer] Inc[employer]  Asthma, Adult Asthma is a condition of the lungs in which the airways tighten and narrow. Asthma can make it hard to breathe. Asthma cannot be cured, but medicine and lifestyle changes can help control it. Asthma may be started (triggered) by:  Animal skin flakes (dander).  Dust.  Cockroaches.  Pollen.  Mold.  Smoke.  Cleaning products.  Hair sprays or aerosol sprays.  Paint fumes or strong smells.  Cold air, weather changes, and winds.  Crying or laughing hard.  Stress.  Certain medicines or drugs.  Foods, such as dried fruit, potato chips, and sparkling grape juice.  Infections or conditions (colds, flu).  Exercise.  Certain medical conditions or diseases.  Exercise or tiring activities. HOME CARE   Take medicine as told by your doctor.  Use a peak flow meter as told by your doctor. A peak flow meter is a tool that measures how well the lungs are working.  Record and keep track of the peak flow meter's readings.  Understand and use the asthma action plan. An asthma action plan is a written plan for taking care of your asthma and treating your attacks.  To help prevent asthma attacks:  Do not smoke. Stay away from secondhand smoke.  Change your heating and air conditioning filter often.  Limit your use of fireplaces and wood stoves.  Get rid of pests (such as roaches and mice) and their droppings.  Throw away plants if you see mold on them.  Clean your floors. Dust regularly. Use cleaning products that do not smell.  Have someone vacuum when you are not home. Use a vacuum cleaner with a HEPA filter if possible.  Replace carpet with wood, tile, or vinyl flooring. Carpet can trap animal skin flakes and dust.  Use allergy-proof pillows, mattress covers, and box spring covers.  Wash bed  sheets and blankets every week in hot water and dry them in a dryer.  Use blankets that are made of polyester or cotton.  Clean bathrooms and kitchens with bleach. If possible, have someone repaint the walls in these rooms with mold-resistant paint. Keep out of the rooms that are being cleaned and painted.  Wash hands often. GET HELP IF:  You have make a whistling sound when breaking (wheeze), have shortness of breath, or have a cough even if taking medicine to prevent attacks.  The colored mucus you cough up (sputum) is thicker than usual.  The colored mucus you cough up changes from clear or white to yellow, green, gray, or bloody.  You have problems from the medicine you are taking such as:  A rash.  Itching.  Swelling.  Trouble breathing.  You need reliever medicines more than 2-3 times a week.  Your peak flow measurement is still at 50-79% of your personal best after following the action plan for 1 hour.  You have a fever. GET HELP RIGHT AWAY IF:   You seem to  be worse and are not responding to medicine during an asthma attack.  You are short of breath even at rest.  You get short of breath when doing very little activity.  You have trouble eating, drinking, or talking.  You have chest pain.  You have a fast heartbeat.  Your lips or fingernails start to turn blue.  You are light-headed, dizzy, or faint.  Your peak flow is less than 50% of your personal best.   This information is not intended to replace advice given to you by your health care provider. Make sure you discuss any questions you have with your health care provider.   Document Released: 01/21/2008 Document Revised: 04/25/2015 Document Reviewed: 03/03/2013 Elsevier Interactive Patient Education [employer]

## 2016-01-03 NOTE — ED Provider Notes (Signed)
CSN: [id number]     Arrival date & time 01/03/16  1655 History   First MD Initiated Contact with Patient 01/03/16 1728     Chief Complaint  Patient presents with  . Cough  . Weakness  . Generalized Body Aches   (Consider location/radiation/quality/duration/timing/severity/associated sxs/prior Treatment) HPI History obtained from patient:  Pt presents with the cc of: Cough, body aches, weakness Duration of symptoms: Over 1 week Treatment prior to arrival: Using albuterol and over-the-counter medications without significant relief Context: Onset of symptoms over a week ago steadily getting worse. Other symptoms include: Dry non-productive cough Pain score: 4 FAMILY HISTORY: Mother with diabetes SOCIAL HISTORY: Nonsmoker  Past Medical History  Diagnosis Date  . Hypertension   . Diabetes mellitus   . Coronary artery disease   . Asthma   . CHF (congestive heart failure) ([facility])   . Arthritis   . Obesity    Past Surgical History  Procedure Laterality Date  . Eye surgery    . Abdominal hysterectomy     Family History  Problem Relation Age of Onset  . Stroke Mother    Social History  Substance Use Topics  . Smoking status: Never Smoker   . Smokeless tobacco: None  . Alcohol Use: No   OB History    No data available     Review of Systems  Denies: HEADACHE, NAUSEA, ABDOMINAL PAIN, CHEST PAIN, CONGESTION, DYSURIA  Allergies  Shrimp; Tetracycline; and Penicillins  Home Medications   Prior to Admission medications   Medication Sig Start Date End Date Taking? Authorizing Provider  acetaminophen (TYLENOL) 500 MG tablet Take 1 tablet (500 mg total) by mouth every 6 (six) hours as needed. 05/04/15  Yes Samantha Tripp Dowless, PA-C  albuterol (PROVENTIL HFA;VENTOLIN HFA) 108 (90 BASE) MCG/ACT inhaler Inhale 2 puffs into the lungs every 6 (six) hours as needed. For wheezing   Yes Historical Provider, MD  aspirin EC 81 MG tablet Take 81 mg by mouth daily.   Yes Historical  Provider, MD  atorvastatin (LIPITOR) 80 MG tablet Take 80 mg by mouth every morning.  04/25/15  Yes Historical Provider, MD  Cholecalciferol 50000 units capsule Take 50,000 Units by mouth every Thursday.    Yes Historical Provider, MD  clotrimazole (LOTRIMIN) 1 % cream Apply 1 application topically daily as needed (for rash).   Yes Historical Provider, MD  clotrimazole-betamethasone (LOTRISONE) cream Apply 1 application topically 2 (two) times daily as needed (for rash).   Yes Historical Provider, MD  esomeprazole (NEXIUM) 40 MG capsule Take 40 mg by mouth 2 (two) times daily before a meal.  04/25/15  Yes Historical Provider, MD  fluticasone (CUTIVATE) 0.05 % cream Apply topically 2 (two) times daily. 04/23/15  Yes Linna Hoff, MD  furosemide (LASIX) 20 MG tablet Take 20 mg by mouth daily.   Yes Historical Provider, MD  glimepiride (AMARYL) 4 MG tablet Take 4 mg by mouth daily with breakfast.  04/25/15  Yes Historical Provider, MD  hydrOXYzine (ATARAX/VISTARIL) 25 MG tablet Take 1 tablet (25 mg total) by mouth every 6 (six) hours. Prn itching 04/23/15  Yes Linna Hoff, MD  insulin detemir (LEVEMIR) 100 UNIT/ML injection Inject 80 Units into the skin 2 (two) times daily.    Yes Historical Provider, MD  lisinopril-hydrochlorothiazide (PRINZIDE,ZESTORETIC) 20-25 MG per tablet Take 1 tablet by mouth daily. 04/25/15  Yes Historical Provider, MD  nebivolol (BYSTOLIC) 5 MG tablet Take 5 mg by mouth daily.   Yes Historical Provider, MD  ondansetron (ZOFRAN) 4 MG tablet Take 1 tablet (4 mg total) by mouth every 6 (six) hours. 05/04/15  Yes Samantha Tripp Dowless, PA-C  potassium chloride (K-DUR) 10 MEQ tablet Take 10 mEq by mouth daily.    Yes Historical Provider, MD   Meds Ordered and Administered this Visit   Medications  albuterol (PROVENTIL) (2.5 MG/3ML) 0.083% nebulizer solution 2.5 mg (2.5 mg Nebulization Given 01/03/16 1803)    BP 160/82 mmHg  Pulse 74  Temp(Src) 99.3 F (37.4 C) (Oral)  Resp 16  SpO2  99% No data found.   Physical Exam  ED Course  Procedures (including critical care time)  Labs Review Labs Reviewed - No data to display  Imaging Review Dg Chest 2 View  01/03/2016  CLINICAL DATA:  Cough, body aches and low-grade fever for over week, history chronic asthma, bronchitis, CHF, hypertension, coronary artery disease EXAM: CHEST  2 VIEW COMPARISON:  09/09/2015 FINDINGS: Enlargement of cardiac silhouette. Mediastinal contours and pulmonary vascularity normal. Tiny linear scarring in lingula unchanged. Lungs otherwise clear. No infiltrate, pleural effusion or pneumothorax. Multilevel endplate spur formation thoracic spine. IMPRESSION: Enlargement of cardiac silhouette with minimal lingular scarring. No acute abnormalities. Electronically Signed   By: Ulyses Southward M.D.   On: 01/03/2016 18:05   I HAVE PERSONALLY  REVIEWED AND DISCUSSED RESULTS OF  X-RAYS WITH PATIENT PRIOR TO DISCHARGE.     Visual Acuity Review  Right Eye Distance:   Left Eye Distance:   Bilateral Distance:    Right Eye Near:   Left Eye Near:    Bilateral Near:       Prednisone and azithromycin  MDM   1. Asthmatic bronchitis with acute exacerbation     Patient is reassured that there are no issues that require transfer to higher level of care at this time or additional tests. Patient is advised to continue home symptomatic treatment. Patient is advised that if there are new or worsening symptoms to attend the emergency department, contact primary care provider, or return to UC. Instructions of care provided discharged home in stable condition.    THIS NOTE WAS GENERATED USING A VOICE RECOGNITION SOFTWARE PROGRAM. ALL REASONABLE EFFORTS  WERE MADE TO PROOFREAD THIS DOCUMENT FOR ACCURACY.  I have verbally reviewed the discharge instructions with the patient. A printed AVS was given to the patient.  All questions were answered prior to discharge.      Tharon Aquas, PA 01/03/16 [phone]

## 2016-01-03 NOTE — Therapy (Signed)
[place], [place], [address] Phone: [phone]   Fax:  [phone]  Physical Therapy Treatment  Patient Details  Name: Katelyn Howard MRN: [id number] Date of Birth: [birth year] Referring Provider: Dr Crist Infante  Encounter Date: 01/03/2016      PT End of Session - 01/03/16 1333    Visit Number 10   Number of Visits 24   Date for PT Re-Evaluation 01/17/16   PT Start Time 0130   PT Stop Time 0208   PT Time Calculation (min) 38 min      Past Medical History  Diagnosis Date  . Hypertension   . Diabetes mellitus   . Coronary artery disease   . Asthma   . CHF (congestive heart failure) ([facility])   . Arthritis   . Obesity     Past Surgical History  Procedure Laterality Date  . Eye surgery    . Abdominal hysterectomy      There were no vitals filed for this visit.      Subjective Assessment - 01/03/16 1333    Subjective My back is better. Still have leg pain and knees are aching   Currently in Pain? Yes   Pain Score 4    Pain Location Leg            [facility] PT Assessment - 01/03/16 0001    Observation/Other Assessments   Focus on Therapeutic Outcomes (FOTO)  68 % limited from 84% limited on eval-taken last visit   AROM   Right Hip Extension -7  edge of mat stretch   Left Hip Extension -5  measured during edge of mat stretch   Lumbar Extension 10   Strength   Right Hip ABduction 3+/5   Left Hip ABduction 3+/5                     [facility] Adult PT Treatment/Exercise - 01/03/16 0001    Self-Care   Self-Care ADL's;Posture   ADL's Review of Body mechanics Handout   Posture Pt seated with foot on 4 inch step to demonstrate supported sitting.    Lumbar Exercises: Sidelying   Hip Abduction 10 reps  2 sets   Hip Abduction Weights (lbs) bilateral   Knee/Hip Exercises: Stretches   Hip Flexor Stretch Left;Both;60 seconds                PT Education - 01/03/16  1349    Education provided Yes   Education Details Body mechanics Handout   Person(s) Educated Patient   Methods Explanation;Handout   Comprehension Verbalized understanding          PT Short Term Goals - 01/03/16 1351    PT SHORT TERM GOAL #1   Title Pt will be independent with initial HEP for continued strengthening and mobility by 12/22/15    Time 4   Period Weeks   Status Achieved   PT SHORT TERM GOAL #2   Title Pt will be able to demo posture and body mechanics as it relates to lumbar spine by 12/22/15.    Time 4   Period Weeks   Status Partially Met   PT SHORT TERM GOAL #3   Title Lumbar extension AROM improved to 10 degrees and pain - free by 12/22/15.   Baseline 10 degrees 3/10 pain   Time 4   Period Weeks   Status Partially Met           PT Long Term Goals -  01/03/16 1400    PT LONG TERM GOAL #1   Title FOTO score will improve from 84% limited to 52% to demo improved function and mobility by 01/16/16.     Time 8   Period Weeks   Status On-going   PT LONG TERM GOAL #2   Title SLR will improve to 40 degrees without increased pain in order to walk for 30 mins without increased pain by 01/16/16   Baseline LLE 30 deg, R 45. Cont pain on L. Reports ability to walk about 10 min   Time 8   Period Weeks   Status Partially Met   PT LONG TERM GOAL #3   Title Hip ABD and flexion  strength will improve to 3+/5   in order to walk for 30 mins without increased pain by 01/16/16.    Baseline Strength at least 3+/5 all hip planes, still unable to walk 30 minutes   Time 8   Period Weeks   Status Partially Met   PT LONG TERM GOAL #4   Title Hip extension will improve to -10 degrees   in order to stand for 30 mins without increased pain by 01/16/16.    Baseline L-5, R -9 still pain with standing    Status Partially Met               Plan - 01/03/16 1406    Clinical Impression Statement Pt reports able to tolerate 10 minutes standing and walking without increased back  pain. Lumbar extension AROM improved, Hip abduction strength improved, Hip extension AROM improved. FOTO score Improved. Pt reports sickness today which caused her to cancel her last visit. She want to go to urgent care when she leaves today. Used our time to go over Biomedical scientist handout due to pt having some SOB, wheezing and trouble lying down on mat due to difficulty with breathing. Progressing toward all goals. Several goals partially Met.    PT Next Visit Plan incoorporate extremity motions to core contractions , have pt demonstrate proper lifting,       Patient will benefit from skilled therapeutic intervention in order to improve the following deficits and impairments:  Abnormal gait, Decreased activity tolerance, Decreased balance, Decreased mobility, Decreased strength, Postural dysfunction, Impaired flexibility, Improper body mechanics, Pain, Obesity, Increased muscle spasms, Decreased endurance, Decreased range of motion, Difficulty walking  Visit Diagnosis: Midline low back pain, with sciatica presence unspecified  Muscle weakness (generalized)     Problem List Patient Active Problem List   Diagnosis Date Noted  . DM, UNCOMPLICATED, TYPE II, UNCONTROLLED 02/12/2007  . HYPERCHOLESTEROLEMIA, PURE 02/12/2007  . OBESITY NOS 02/12/2007  . HYPERTENSION, BENIGN ESSENTIAL, CONTROLLED 02/12/2007  . ALLERGIC RHINITIS 02/12/2007  . ASTHMA, CHILDHOOD 02/12/2007  . DEGENERATIVE JOINT DISEASE 02/12/2007    Dorene Ar, PTA 01/03/2016, 2:15 PM  [facility] [address] [place], [place], [address] Phone: [phone]   Fax:  [phone]  Name: SANTINA TRILLO MRN: [id number] Date of Birth: 26-Jun-1961

## 2016-01-03 NOTE — Patient Instructions (Signed)

## 2016-01-03 NOTE — ED Notes (Signed)
Pt has been suffering from a cough, body aches, and weakness for one week.  She has been taking cough medicine with no relief.  She states she has been having night sweats, but has not measured her temperature at home.

## 2016-01-07 ENCOUNTER — Ambulatory Visit: Payer: Commercial Managed Care - HMO | Admitting: Physical Therapy

## 2016-01-07 ENCOUNTER — Encounter: Payer: Self-pay | Admitting: Physical Therapy

## 2016-01-07 DIAGNOSIS — M6281 Muscle weakness (generalized): Secondary | ICD-10-CM

## 2016-01-07 DIAGNOSIS — R262 Difficulty in walking, not elsewhere classified: Secondary | ICD-10-CM | POA: Diagnosis not present

## 2016-01-07 DIAGNOSIS — M545 Low back pain: Secondary | ICD-10-CM | POA: Diagnosis not present

## 2016-01-07 NOTE — Therapy (Addendum)
[place], [place], [address] Phone: [phone]   Fax:  [phone]  Physical Therapy Treatment/Discharge Summary  Patient Details  Name: Katelyn Howard MRN: [id number] Date of Birth: February 19, 1961 Referring Provider: Dr Crist Infante  Encounter Date: 01/07/2016      PT End of Session - 01/07/16 1337    Visit Number 11   Number of Visits 24   Date for PT Re-Evaluation 01/17/16   PT Start Time 1331   PT Stop Time 1409   PT Time Calculation (min) 38 min   Activity Tolerance Patient tolerated treatment well   Behavior During Therapy [facility] for tasks assessed/performed      Past Medical History  Diagnosis Date  . Hypertension   . Diabetes mellitus   . Coronary artery disease   . Asthma   . CHF (congestive heart failure) ([place])   . Arthritis   . Obesity     Past Surgical History  Procedure Laterality Date  . Eye surgery    . Abdominal hysterectomy      There were no vitals filed for this visit.      Subjective Assessment - 01/07/16 1335    Subjective back is feeling better, knee pain (R more than L) that increased over the weekend.    Pertinent History HTN, DM, Cholesterol, DJD, asthma, obesity    Currently in Pain? Yes   Pain Score 3    Pain Location Back   Pain Orientation Mid   Multiple Pain Sites Yes   Pain Score 6   Pain Location Knee   Pain Descriptors / Indicators Aching   Aggravating Factors  walking, standing    Pain Relieving Factors rest                         [facility] Adult PT Treatment/Exercise - 01/07/16 0001    Lumbar Exercises: Supine   AB Set Limitations ab set with marches x20   Other Supine Lumbar Exercises deadbugs. 2 lb   Knee/Hip Exercises: Stretches   Passive Hamstring Stretch Limitations seated EOB 2x30s ea   Gastroc Stretch Limitations slant board L1 2x30s   Knee/Hip Exercises: Aerobic   Nustep L6 5 min   Knee/Hip Exercises: Standing   Heel  Raises Limitations x30   Extension Limitations 2x10 ea   Gait Training retro walking   Knee/Hip Exercises: Seated   Long Arc Quad Limitations seated with OH reach 2lb   Abd/Adduction Limitations GTB x20   Knee/Hip Exercises: Supine   Other Supine Knee/Hip Exercises isometric hamstring curl over physioball                PT Education - 01/07/16 1412    Education provided Yes   Education Details exercise form/rationale and modifications   Person(s) Educated Patient   Methods Explanation;Demonstration;Tactile cues;Verbal cues   Comprehension Verbalized understanding;Returned demonstration;Verbal cues required;Tactile cues required;Need further instruction          PT Short Term Goals - 01/03/16 1351    PT SHORT TERM GOAL #1   Title Pt will be independent with initial HEP for continued strengthening and mobility by 12/22/15    Time 4   Period Weeks   Status Achieved   PT SHORT TERM GOAL #2   Title Pt will be able to demo posture and body mechanics as it relates to lumbar spine by 12/22/15.    Time 4   Period Weeks   Status Partially  Met   PT SHORT TERM GOAL #3   Title Lumbar extension AROM improved to 10 degrees and pain - free by 12/22/15.   Baseline 10 degrees 3/10 pain   Time 4   Period Weeks   Status Partially Met           PT Long Term Goals - 01/03/16 1400    PT LONG TERM GOAL #1   Title FOTO score will improve from 84% limited to 52% to demo improved function and mobility by 01/16/16.     Time 8   Period Weeks   Status On-going   PT LONG TERM GOAL #2   Title SLR will improve to 40 degrees without increased pain in order to walk for 30 mins without increased pain by 01/16/16   Baseline LLE 30 deg, R 45. Cont pain on L. Reports ability to walk about 10 min   Time 8   Period Weeks   Status Partially Met   PT LONG TERM GOAL #3   Title Hip ABD and flexion  strength will improve to 3+/5   in order to walk for 30 mins without increased pain by 01/16/16.     Baseline Strength at least 3+/5 all hip planes, still unable to walk 30 minutes   Time 8   Period Weeks   Status Partially Met   PT LONG TERM GOAL #4   Title Hip extension will improve to -10 degrees   in order to stand for 30 mins without increased pain by 01/16/16.    Baseline L-5, R -9 still pain with standing    Status Partially Met               Plan - 01/07/16 1330    Clinical Impression Statement Pt reports she is feeling much better than when she began. Discussed d/c plan and pt was instructed to pay attention to daily activities to determine any specific limitations that still exist.    PT Treatment/Interventions ADLs/Self Care Home Management;Cryotherapy;Electrical Stimulation;Moist Heat;Traction;Therapeutic activities;Therapeutic exercise;Balance training;Functional mobility training;Stair training;Gait training;Ultrasound;Neuromuscular re-education;Cognitive remediation;Patient/family education;Manual techniques;Taping;Energy conservation;Dry needling;Passive range of motion   PT Next Visit Plan review goals, FOTO, POC through 5/31   Consulted and Agree with Plan of Care Patient      Patient will benefit from skilled therapeutic intervention in order to improve the following deficits and impairments:  Abnormal gait, Decreased activity tolerance, Decreased balance, Decreased mobility, Decreased strength, Postural dysfunction, Impaired flexibility, Improper body mechanics, Pain, Obesity, Increased muscle spasms, Decreased endurance, Decreased range of motion, Difficulty walking  Visit Diagnosis: Midline low back pain, with sciatica presence unspecified  Muscle weakness (generalized)  Difficulty in walking, not elsewhere classified     Problem List Patient Active Problem List   Diagnosis Date Noted  . DM, UNCOMPLICATED, TYPE II, UNCONTROLLED 02/12/2007  . HYPERCHOLESTEROLEMIA, PURE 02/12/2007  . OBESITY NOS 02/12/2007  . HYPERTENSION, BENIGN ESSENTIAL, CONTROLLED  02/12/2007  . ALLERGIC RHINITIS 02/12/2007  . ASTHMA, CHILDHOOD 02/12/2007  . DEGENERATIVE JOINT DISEASE 02/12/2007   [place] Virrueta C. Charlean Carneal PT, DPT 01/07/2016 2:13 PM   [facility] [address] [place], [place], [address] Phone: [phone]   Fax:  [phone]  Name: Katelyn Howard MRN: [id number] Date of Birth: 11-09-1960   PHYSICAL THERAPY DISCHARGE SUMMARY  Visits from Start of Care: 11  Current functional level related to goals / functional outcomes: See above   Remaining deficits: See above   Education / Equipment: Anatomy of condition, POC, HEP,  exercise form/rationale  Plan: Patient agrees to discharge.  Patient goals were not met. Patient is being discharged due to not returning since the last visit.  ?????  Whitten Andreoni C. Weslynn Ke PT, DPT 06/05/16 3:41 PM

## 2016-01-09 ENCOUNTER — Encounter: Payer: Medicare HMO | Admitting: Physical Therapy

## 2016-01-10 ENCOUNTER — Ambulatory Visit: Payer: Commercial Managed Care - HMO | Admitting: Physical Therapy

## 2016-03-08 ENCOUNTER — Encounter (HOSPITAL_COMMUNITY): Payer: Self-pay

## 2016-03-08 ENCOUNTER — Emergency Department (HOSPITAL_COMMUNITY): Payer: Commercial Managed Care - HMO

## 2016-03-08 ENCOUNTER — Emergency Department (HOSPITAL_COMMUNITY)
Admission: EM | Admit: 2016-03-08 | Discharge: 2016-03-08 | Disposition: A | Discharge status: Home / Self Care | Payer: Commercial Managed Care - HMO | Attending: Emergency Medicine | Admitting: Emergency Medicine

## 2016-03-08 DIAGNOSIS — J45909 Unspecified asthma, uncomplicated: Secondary | ICD-10-CM | POA: Insufficient documentation

## 2016-03-08 DIAGNOSIS — R600 Localized edema: Secondary | ICD-10-CM | POA: Diagnosis not present

## 2016-03-08 DIAGNOSIS — M79604 Pain in right leg: Secondary | ICD-10-CM | POA: Insufficient documentation

## 2016-03-08 DIAGNOSIS — E119 Type 2 diabetes mellitus without complications: Secondary | ICD-10-CM | POA: Diagnosis not present

## 2016-03-08 DIAGNOSIS — Z794 Long term (current) use of insulin: Secondary | ICD-10-CM | POA: Diagnosis not present

## 2016-03-08 DIAGNOSIS — Z7984 Long term (current) use of oral hypoglycemic drugs: Secondary | ICD-10-CM | POA: Insufficient documentation

## 2016-03-08 DIAGNOSIS — M79605 Pain in left leg: Secondary | ICD-10-CM | POA: Diagnosis not present

## 2016-03-08 DIAGNOSIS — I10 Essential (primary) hypertension: Secondary | ICD-10-CM | POA: Diagnosis not present

## 2016-03-08 DIAGNOSIS — I2510 Atherosclerotic heart disease of native coronary artery without angina pectoris: Secondary | ICD-10-CM | POA: Diagnosis not present

## 2016-03-08 DIAGNOSIS — Z7982 Long term (current) use of aspirin: Secondary | ICD-10-CM | POA: Insufficient documentation

## 2016-03-08 DIAGNOSIS — I509 Heart failure, unspecified: Secondary | ICD-10-CM | POA: Diagnosis not present

## 2016-03-08 DIAGNOSIS — R52 Pain, unspecified: Secondary | ICD-10-CM

## 2016-03-08 DIAGNOSIS — I110 Hypertensive heart disease with heart failure: Secondary | ICD-10-CM | POA: Diagnosis not present

## 2016-03-08 DIAGNOSIS — R0602 Shortness of breath: Secondary | ICD-10-CM | POA: Diagnosis not present

## 2016-03-08 DIAGNOSIS — Z79899 Other long term (current) drug therapy: Secondary | ICD-10-CM | POA: Diagnosis not present

## 2016-03-08 DIAGNOSIS — M791 Myalgia: Secondary | ICD-10-CM | POA: Diagnosis not present

## 2016-03-08 LAB — COMPREHENSIVE METABOLIC PANEL
ALK PHOS: 99 U/L (ref 38–126)
ALT: 34 U/L (ref 14–54)
AST: 24 U/L (ref 15–41)
Albumin: 3.5 g/dL (ref 3.5–5.0)
Anion gap: 8 (ref 5–15)
BILIRUBIN TOTAL: 0.3 mg/dL (ref 0.3–1.2)
BUN: 6 mg/dL (ref 6–20)
CALCIUM: 9.2 mg/dL (ref 8.9–10.3)
CHLORIDE: 103 mmol/L (ref 101–111)
CO2: 27 mmol/L (ref 22–32)
CREATININE: 0.57 mg/dL (ref 0.44–1.00)
GFR calc Af Amer: >60 mL/min (ref >60)
Glucose, Bld: 95 mg/dL (ref 65–99)
Potassium: 3.5 mmol/L (ref 3.5–5.1)
Sodium: 138 mmol/L (ref 135–145)
Total Protein: 6.9 g/dL (ref 6.5–8.1)

## 2016-03-08 LAB — URINALYSIS, ROUTINE W REFLEX MICROSCOPIC
Bilirubin Urine: NEGATIVE
Glucose, UA: NEGATIVE mg/dL
KETONES UR: NEGATIVE mg/dL
LEUKOCYTES UA: NEGATIVE
Nitrite: NEGATIVE
PROTEIN: 100 mg/dL — AB
Specific Gravity, Urine: 1.023 (ref 1.005–1.030)
pH: 7 (ref 5.0–8.0)

## 2016-03-08 LAB — CBC WITH DIFFERENTIAL/PLATELET
BASOS PCT: 0 %
Basophils Absolute: 0 10*3/uL (ref 0.0–0.1)
Eosinophils Absolute: 0.2 10*3/uL (ref 0.0–0.7)
Eosinophils Relative: 3 %
HEMATOCRIT: 40.4 % (ref 36.0–46.0)
Hemoglobin: 12.3 g/dL (ref 12.0–15.0)
LYMPHS ABS: 2.5 10*3/uL (ref 0.7–4.0)
Lymphocytes Relative: 36 %
MCH: 24.5 pg — AB (ref 26.0–34.0)
MCHC: 30.4 g/dL (ref 30.0–36.0)
MCV: 80.5 fL (ref 78.0–100.0)
MONO ABS: 0.5 10*3/uL (ref 0.1–1.0)
MONOS PCT: 7 %
NEUTROS ABS: 3.8 10*3/uL (ref 1.7–7.7)
Neutrophils Relative %: 54 %
PLATELETS: 229 10*3/uL (ref 150–400)
RBC: 5.02 MIL/uL (ref 3.87–5.11)
RDW: 14.6 % (ref 11.5–15.5)
WBC: 6.9 10*3/uL (ref 4.0–10.5)

## 2016-03-08 LAB — URINE MICROSCOPIC-ADD ON
Bacteria, UA: NONE SEEN
RBC / HPF: NONE SEEN RBC/hpf (ref 0–5)
WBC UA: NONE SEEN WBC/hpf (ref 0–5)

## 2016-03-08 LAB — BRAIN NATRIURETIC PEPTIDE: B Natriuretic Peptide: 76.6 pg/mL (ref 0.0–100.0)

## 2016-03-08 LAB — CBG MONITORING, ED: GLUCOSE-CAPILLARY: 156 mg/dL — AB (ref 65–99)

## 2016-03-08 LAB — TROPONIN I

## 2016-03-08 MED ORDER — HYDROCODONE-ACETAMINOPHEN 5-325 MG PO TABS
2.0000 | ORAL_TABLET | Freq: Once | ORAL | Status: AC
  Administered 2016-03-08: 2 via ORAL
  Filled 2016-03-08: qty 2

## 2016-03-08 MED ORDER — CLONIDINE HCL 0.2 MG PO TABS
0.2000 mg | ORAL_TABLET | Freq: Once | ORAL | Status: AC
  Administered 2016-03-08: 0.2 mg via ORAL
  Filled 2016-03-08: qty 1

## 2016-03-08 MED ORDER — LABETALOL HCL 5 MG/ML IV SOLN
10.0000 mg | Freq: Once | INTRAVENOUS | Status: DC
  Filled 2016-03-08: qty 4

## 2016-03-08 MED ORDER — ACETAMINOPHEN 500 MG PO TABS
1000.0000 mg | ORAL_TABLET | Freq: Once | ORAL | Status: AC
  Administered 2016-03-08: 1000 mg via ORAL
  Filled 2016-03-08: qty 2

## 2016-03-08 MED ORDER — SODIUM CHLORIDE 0.9 % IV BOLUS (SEPSIS)
500.0000 mL | Freq: Once | INTRAVENOUS | Status: AC
  Administered 2016-03-08: 500 mL via INTRAVENOUS

## 2016-03-08 MED ORDER — CLONIDINE HCL 0.1 MG PO TABS: 0.1000 mg | ORAL_TABLET | Freq: Two times a day (BID) | ORAL | Status: AC

## 2016-03-08 MED ORDER — HYDRALAZINE HCL 20 MG/ML IJ SOLN: 10.0000 mg | Freq: Once | INTRAMUSCULAR | Status: DC

## 2016-03-08 NOTE — ED Provider Notes (Signed)
CSN: [id number]     Arrival date & time 03/08/16  1403 History   First MD Initiated Contact with Patient 03/08/16 1737     Chief Complaint  Patient presents with  . Leg Pain  . right side pain      (Consider location/radiation/quality/duration/timing/severity/associated sxs/prior Treatment) HPI Comments: 55 year old female with diabetes, obesity, high blood pressure, heart failure, ostia arthritis presents with multiple different symptoms. Patient has had intermittent symptoms for 2 weeks. Patient's had mild shortness of breath cough and symptoms consistent with her asthma however mild more severe. Patient denies classic blood clot history risks. Patient has also had body aches worse in the legs. Patient denies new medicines however is on cholesterol medication. Patient denies fever chills. Patient has had general fatigue. No known weight gain. No orthopnea. Mild bilateral lower ankle edema. Unsure if increased salt intake. Patient also had mild urinary symptoms of pressure sensation and mild intermittent right lower flank pain without kidney stone history. Patient's had urine infection the past  Patient is a 55 y.o. female presenting with leg pain. The history is provided by the patient.  Leg Pain Associated symptoms: back pain and fatigue   Associated symptoms: no fever and no neck pain     Past Medical History  Diagnosis Date  . Hypertension   . Diabetes mellitus   . Coronary artery disease   . Asthma   . CHF (congestive heart failure) ([facility])   . Arthritis   . Obesity    Past Surgical History  Procedure Laterality Date  . Eye surgery    . Abdominal hysterectomy     Family History  Problem Relation Age of Onset  . Stroke Mother    Social History  Substance Use Topics  . Smoking status: Never Smoker   . Smokeless tobacco: None  . Alcohol Use: No   OB History    No data available     Review of Systems  Constitutional: Positive for fatigue. Negative for fever and  chills.  HENT: Negative for congestion.   Eyes: Negative for visual disturbance.  Respiratory: Positive for cough and shortness of breath.   Cardiovascular: Negative for chest pain.  Gastrointestinal: Negative for vomiting and abdominal pain.  Genitourinary: Positive for dysuria. Negative for flank pain.  Musculoskeletal: Positive for back pain and arthralgias. Negative for neck pain and neck stiffness.  Skin: Negative for rash.  Neurological: Positive for light-headedness. Negative for headaches.      Allergies  Shrimp; Tetracycline; and Penicillins  Home Medications   Prior to Admission medications   Medication Sig Start Date End Date Taking? Authorizing Provider  albuterol (PROVENTIL HFA;VENTOLIN HFA) 108 (90 BASE) MCG/ACT inhaler Inhale 2 puffs into the lungs every 6 (six) hours as needed. For wheezing   Yes Historical Provider, MD  aspirin EC 81 MG tablet Take 81 mg by mouth daily.   Yes Historical Provider, MD  atorvastatin (LIPITOR) 80 MG tablet Take 80 mg by mouth every morning.  04/25/15  Yes Historical Provider, MD  Cholecalciferol 50000 units capsule Take 50,000 Units by mouth every Thursday.    Yes Historical Provider, MD  clotrimazole-betamethasone (LOTRISONE) cream Apply 1 application topically 2 (two) times daily as needed (for rash).   Yes Historical Provider, MD  esomeprazole (NEXIUM) 40 MG capsule Take 40 mg by mouth 2 (two) times daily before a meal.  04/25/15  Yes Historical Provider, MD  furosemide (LASIX) 20 MG tablet Take 20 mg by mouth daily.   Yes Historical Provider, MD  glimepiride (AMARYL) 4 MG tablet Take 4 mg by mouth daily with breakfast.  04/25/15  Yes Historical Provider, MD  hydrOXYzine (ATARAX/VISTARIL) 25 MG tablet Take 1 tablet (25 mg total) by mouth every 6 (six) hours. Prn itching 04/23/15  Yes Linna Hoff, MD  insulin detemir (LEVEMIR) 100 UNIT/ML injection Inject 80 Units into the skin 2 (two) times daily.    Yes Historical Provider, MD  nebivolol  (BYSTOLIC) 5 MG tablet Take 5 mg by mouth daily.   Yes Historical Provider, MD  ondansetron (ZOFRAN) 4 MG tablet Take 1 tablet (4 mg total) by mouth every 6 (six) hours. 05/04/15  Yes Samantha Tripp Dowless, PA-C  potassium chloride (K-DUR) 10 MEQ tablet Take 10 mEq by mouth daily.    Yes Historical Provider, MD  acetaminophen (TYLENOL) 500 MG tablet Take 1 tablet (500 mg total) by mouth every 6 (six) hours as needed. 05/04/15   Samantha Tripp Dowless, PA-C  azithromycin (ZITHROMAX) 250 MG tablet Take first 2 tablets together, then 1 every day until finished. 01/03/16   Tharon Aquas, PA  cloNIDine (CATAPRES) 0.1 MG tablet Take 1 tablet (0.1 mg total) by mouth 2 (two) times daily. 03/08/16   Blane Ohara, MD  clotrimazole (LOTRIMIN) 1 % cream Apply 1 application topically daily as needed (for rash).    Historical Provider, MD  fluticasone (CUTIVATE) 0.05 % cream Apply topically 2 (two) times daily. 04/23/15   Linna Hoff, MD  predniSONE (DELTASONE) 10 MG tablet Sig: 4 tables once a day for 3 days, 3 tablets once a day X3 days, 2 tablets a day for 3 days, 1 tablet a day for 3 days. 01/03/16   Tharon Aquas, PA   BP 137/90 mmHg  Pulse 53  Temp(Src) 98.3 F (36.8 C) (Oral)  Resp 21  Ht 5\' 3"  (1.6 m)  Wt 299 lb (135.626 kg)  BMI 52.98 kg/m2  SpO2 96% Physical Exam  Constitutional: She is oriented to person, place, and time. She appears well-developed and well-nourished.  HENT:  Head: Normocephalic and atraumatic.  Mild dry mucous membranes  Eyes: Conjunctivae are normal. Right eye exhibits no discharge. Left eye exhibits no discharge.  Neck: Normal range of motion. Neck supple. No tracheal deviation present.  Cardiovascular: Regular rhythm and intact distal pulses.   Pulmonary/Chest: Effort normal and breath sounds normal.  Abdominal: Soft. She exhibits no distension. There is no tenderness. There is no guarding.  Musculoskeletal: She exhibits edema (mild LE bilateral ankles and feet).   Neurological: She is alert and oriented to person, place, and time. No cranial nerve deficit.  Skin: Skin is warm. No rash noted.  Psychiatric: She has a normal mood and affect.  Nursing note and vitals reviewed.   ED Course  Procedures (including critical care time) Labs Review Labs Reviewed  URINALYSIS, ROUTINE W REFLEX MICROSCOPIC (NOT AT [facility]) - Abnormal; Notable for the following:    Hgb urine dipstick TRACE (*)    Protein, ur 100 (*)    All other components within normal limits  URINE MICROSCOPIC-ADD ON - Abnormal; Notable for the following:    Squamous Epithelial / LPF 0-5 (*)    All other components within normal limits  CBC WITH DIFFERENTIAL/PLATELET - Abnormal; Notable for the following:    MCH 24.5 (*)    All other components within normal limits  CBG MONITORING, ED - Abnormal; Notable for the following:    Glucose-Capillary 156 (*)    All other components within normal limits  COMPREHENSIVE METABOLIC PANEL  TROPONIN I  BRAIN NATRIURETIC PEPTIDE    Imaging Review Dg Chest 2 View  03/08/2016  CLINICAL DATA:  Shortness of breath and nausea 3 days. EXAM: CHEST  2 VIEW COMPARISON:  01/03/2016 FINDINGS: Lungs are adequately inflated with minimal linear atelectasis/scarring over the left midlung. Borderline stable cardiomegaly. Mild degenerate change of the spine. IMPRESSION: No acute cardiopulmonary disease. Electronically Signed   By: Elberta Fortis M.D.   On: 03/08/2016 19:30   I have personally reviewed and evaluated these images and lab results as part of my medical decision-making.   EKG Interpretation   Date/Time:  Saturday March 08 2016 19:14:51 EDT Ventricular Rate:  58 PR Interval:    QRS Duration: 86 QT Interval:  463 QTC Calculation: 455 R Axis:   48 Text Interpretation:  Sinus rhythm Low voltage, precordial leads Baseline  wander in lead(s) V2 V3 V5 V6 Confirmed by Jodi Mourning MD, Tammra Pressman ([id number]) on  03/08/2016 8:50:04 PM      MDM   Final diagnoses:   Body aches  Essential hypertension   Patient presents generally not feeling well with multiple different symptoms. Plan for cardiac screen with fatigue and shortness of breath, patient denies risk factors for blood clots, normal oxygen, normal heart rate.  With general body aches and mild urinary symptoms plan for screening blood work, urinalysis. Small fluid bolus will be ordered. Tylenol for pain. Blood pressure improved without significant treatment. Patient has been compliant with her blood pressure medicines. Patient improved on reassessment blood pressure improved. Screening workup no significant findings. Discussed close follow-up outpatient with primary Dr. for further evaluation and better control blood pressure. Prescription for clonidine will be given. Results and differential diagnosis were discussed with the patient/parent/guardian. Xrays were independently reviewed by myself.  Close follow up outpatient was discussed, comfortable with the plan.   Medications  HYDROcodone-acetaminophen (NORCO/VICODIN) 5-325 MG per tablet 2 tablet (not administered)  acetaminophen (TYLENOL) tablet 1,000 mg (1,000 mg Oral Given 03/08/16 1915)  sodium chloride 0.9 % bolus 500 mL (500 mLs Intravenous New Bag/Given 03/08/16 1915)  cloNIDine (CATAPRES) tablet 0.2 mg (0.2 mg Oral Given 03/08/16 1925)    Filed Vitals:   03/08/16 1930 03/08/16 1945 03/08/16 2015 03/08/16 2030  BP: 207/94 201/86 141/72 137/90  Pulse: 64 58 56 53  Temp:      TempSrc:      Resp: 13 15 20 21   Height:      Weight:      SpO2: 98% 95% 97% 96%    Final diagnoses:  Body aches  Essential hypertension        , MD 03/08/16 2056

## 2016-03-08 NOTE — ED Notes (Signed)
Patient ambulated to bathroom.

## 2016-03-08 NOTE — ED Notes (Signed)
Patient here with 2 weeks of bilateral leg pain, states pain from hips to feet. Pain worse with ambulation. Also concerned that she has right side/flank pain for same and reports dark urine. Also complains that her bronchitis is acting up, no wheezing, NAD

## 2016-03-08 NOTE — ED Notes (Signed)
Placed patient into gown 

## 2016-03-08 NOTE — Discharge Instructions (Signed)
Take blood pressure medicines as directed, hold if you feel lightheaded or pass out.  If you were given medicines take as directed.  If you are on coumadin or contraceptives realize their levels and effectiveness is altered by many different medicines.  If you have any reaction (rash, tongues swelling, other) to the medicines stop taking and see a physician.    If your blood pressure was elevated in the ER make sure you follow up for management with a primary doctor or return for chest pain, shortness of breath or stroke symptoms.  Please follow up as directed and return to the ER or see a physician for new or worsening symptoms.  Thank you.

## 2016-03-12 DIAGNOSIS — E119 Type 2 diabetes mellitus without complications: Secondary | ICD-10-CM | POA: Diagnosis not present

## 2016-03-12 DIAGNOSIS — I10 Essential (primary) hypertension: Secondary | ICD-10-CM | POA: Diagnosis not present

## 2016-03-12 DIAGNOSIS — Z6841 Body Mass Index (BMI) 40.0 and over, adult: Secondary | ICD-10-CM | POA: Diagnosis not present

## 2016-03-12 DIAGNOSIS — E669 Obesity, unspecified: Secondary | ICD-10-CM | POA: Diagnosis not present

## 2016-05-06 DIAGNOSIS — R8299 Other abnormal findings in urine: Secondary | ICD-10-CM | POA: Diagnosis not present

## 2016-05-06 DIAGNOSIS — E119 Type 2 diabetes mellitus without complications: Secondary | ICD-10-CM | POA: Diagnosis not present

## 2016-05-06 DIAGNOSIS — I10 Essential (primary) hypertension: Secondary | ICD-10-CM | POA: Diagnosis not present

## 2016-05-06 DIAGNOSIS — E784 Other hyperlipidemia: Secondary | ICD-10-CM | POA: Diagnosis not present

## 2016-05-13 DIAGNOSIS — E668 Other obesity: Secondary | ICD-10-CM | POA: Diagnosis not present

## 2016-05-13 DIAGNOSIS — M79669 Pain in unspecified lower leg: Secondary | ICD-10-CM | POA: Diagnosis not present

## 2016-05-13 DIAGNOSIS — G608 Other hereditary and idiopathic neuropathies: Secondary | ICD-10-CM | POA: Diagnosis not present

## 2016-05-13 DIAGNOSIS — Z0000 Encounter for general adult medical examination without abnormal findings: Secondary | ICD-10-CM | POA: Diagnosis not present

## 2016-05-13 DIAGNOSIS — R0683 Snoring: Secondary | ICD-10-CM | POA: Insufficient documentation

## 2016-05-13 DIAGNOSIS — I509 Heart failure, unspecified: Secondary | ICD-10-CM | POA: Diagnosis not present

## 2016-05-13 DIAGNOSIS — J3089 Other allergic rhinitis: Secondary | ICD-10-CM | POA: Diagnosis not present

## 2016-05-13 DIAGNOSIS — M5489 Other dorsalgia: Secondary | ICD-10-CM | POA: Diagnosis not present

## 2016-05-13 DIAGNOSIS — R601 Generalized edema: Secondary | ICD-10-CM | POA: Diagnosis not present

## 2016-05-13 DIAGNOSIS — M1990 Unspecified osteoarthritis, unspecified site: Secondary | ICD-10-CM | POA: Diagnosis not present

## 2016-05-21 DIAGNOSIS — M79605 Pain in left leg: Secondary | ICD-10-CM | POA: Diagnosis not present

## 2016-05-21 DIAGNOSIS — Z6841 Body Mass Index (BMI) 40.0 and over, adult: Secondary | ICD-10-CM | POA: Diagnosis not present

## 2016-05-21 DIAGNOSIS — J45909 Unspecified asthma, uncomplicated: Secondary | ICD-10-CM | POA: Diagnosis not present

## 2016-05-21 DIAGNOSIS — I509 Heart failure, unspecified: Secondary | ICD-10-CM | POA: Diagnosis not present

## 2016-05-21 DIAGNOSIS — M1990 Unspecified osteoarthritis, unspecified site: Secondary | ICD-10-CM | POA: Diagnosis not present

## 2016-05-21 DIAGNOSIS — R609 Edema, unspecified: Secondary | ICD-10-CM | POA: Diagnosis not present

## 2016-06-04 ENCOUNTER — Encounter (INDEPENDENT_AMBULATORY_CARE_PROVIDER_SITE_OTHER): Payer: Self-pay

## 2016-06-04 ENCOUNTER — Ambulatory Visit (INDEPENDENT_AMBULATORY_CARE_PROVIDER_SITE_OTHER): Payer: Commercial Managed Care - HMO | Admitting: Cardiology

## 2016-06-04 ENCOUNTER — Encounter: Payer: Self-pay | Admitting: Cardiology

## 2016-06-04 VITALS — BP 142/74 | HR 64 | Ht 63.0 in | Wt 288.8 lb

## 2016-06-04 DIAGNOSIS — R0689 Other abnormalities of breathing: Secondary | ICD-10-CM | POA: Diagnosis not present

## 2016-06-04 DIAGNOSIS — I10 Essential (primary) hypertension: Secondary | ICD-10-CM | POA: Diagnosis not present

## 2016-06-04 DIAGNOSIS — M549 Dorsalgia, unspecified: Secondary | ICD-10-CM | POA: Diagnosis not present

## 2016-06-04 DIAGNOSIS — G4719 Other hypersomnia: Secondary | ICD-10-CM | POA: Diagnosis not present

## 2016-06-04 NOTE — Patient Instructions (Signed)
Medication Instructions:  Your physician recommends that you continue on your current medications as directed. Please refer to the Current Medication list given to you today.   Labwork: None  Testing/Procedures: Your physician has requested that you have an echocardiogram. Echocardiography is a painless test that uses sound waves to create images of your heart. It provides your doctor with information about the size and shape of your heart and how well your heart's chambers and valves are working. This procedure takes approximately one hour. There are no restrictions for this procedure.  Your physician has recommended that you have a sleep study. This test records several body functions during sleep, including: brain activity, eye movement, oxygen and carbon dioxide blood levels, heart rate and rhythm, breathing rate and rhythm, the flow of air through your mouth and nose, snoring, body muscle movements, and chest and belly movement.  Follow-Up: Your physician recommends that you schedule a follow-up appointment AS NEEDED with Dr. Mayford Knife pending study results.  Any Other Special Instructions Will Be Listed Below (If Applicable).     If you need a refill on your cardiac medications before your next appointment, please call your pharmacy.

## 2016-06-04 NOTE — Progress Notes (Signed)
Cardiology Office Note    Date:  06/04/2016   ID:  Katelyn Howard, DOB [birth year], MRN [id number]  PCP:  Dorrene German, MD  Cardiologist:  Armanda Magic, MD   Chief Complaint  Patient presents with  . Shortness of Breath  . Hypertension    History of Present Illness:  Katelyn Howard is a 55 y.o. female with a history of asthma, CHF, HTN and obesity who is referred for evaluation of fatigue and SOB.  She was recently seen in the ER for complaints of SOB with cough and was felt to have an asthma exacerbation but also has some mild LE edema.  She says that the SOB does not feel like her asthma.  She says that she notices the SOB at night when she is laying down and will have to get up out of bed.  She denies any DOE during unless her asthma is bothering her.  She will wake up gasping for breath.  She does not feel sleepy when she wakes up but gets sleepy during the day and has to nap.  She has never been diagnosed with sleep apnea.  She says that she has severe snoring.  She has had some LE edema that is chronic and her diuretic has recently been increased.  She denies any chest pain or pressure.  She has not had any dizziness, palpitations or syncope.     Past Medical History:  Diagnosis Date  . Arthritis   . Asthma   . CHF (congestive heart failure) (HCC)   . Diabetes mellitus   . Hypertension   . Obesity     Past Surgical History:  Procedure Laterality Date  . ABDOMINAL HYSTERECTOMY    . EYE SURGERY      Current Medications: Outpatient Medications Prior to Visit  Medication Sig Dispense Refill  . acetaminophen (TYLENOL) 500 MG tablet Take 1 tablet (500 mg total) by mouth every 6 (six) hours as needed. 30 tablet 0  . albuterol (PROVENTIL HFA;VENTOLIN HFA) 108 (90 BASE) MCG/ACT inhaler Inhale 2 puffs into the lungs every 6 (six) hours as needed. For wheezing    . aspirin EC 81 MG tablet Take 81 mg by mouth daily.    Marland Kitchen atorvastatin (LIPITOR) 80 MG tablet Take 80  mg by mouth every morning.     Marland Kitchen azithromycin (ZITHROMAX) 250 MG tablet Take first 2 tablets together, then 1 every day until finished. 6 tablet 0  . Cholecalciferol 50000 units capsule Take 50,000 Units by mouth every Thursday.     . cloNIDine (CATAPRES) 0.1 MG tablet Take 1 tablet (0.1 mg total) by mouth 2 (two) times daily. 60 tablet 11  . clotrimazole (LOTRIMIN) 1 % cream Apply 1 application topically daily as needed (for rash).    . clotrimazole-betamethasone (LOTRISONE) cream Apply 1 application topically 2 (two) times daily as needed (for rash).    Marland Kitchen esomeprazole (NEXIUM) 40 MG capsule Take 40 mg by mouth 2 (two) times daily before a meal.     . fluticasone (CUTIVATE) 0.05 % cream Apply topically 2 (two) times daily. 30 g 1  . furosemide (LASIX) 20 MG tablet Take 20 mg by mouth daily.    Marland Kitchen glimepiride (AMARYL) 4 MG tablet Take 4 mg by mouth daily with breakfast.     . hydrOXYzine (ATARAX/VISTARIL) 25 MG tablet Take 1 tablet (25 mg total) by mouth every 6 (six) hours. Prn itching 20 tablet 1  . insulin detemir (LEVEMIR) 100 UNIT/ML injection  Inject 80 Units into the skin 2 (two) times daily.     . nebivolol (BYSTOLIC) 5 MG tablet Take 5 mg by mouth daily.    . ondansetron (ZOFRAN) 4 MG tablet Take 1 tablet (4 mg total) by mouth every 6 (six) hours. 12 tablet 0  . potassium chloride (K-DUR) 10 MEQ tablet Take 10 mEq by mouth daily.     . predniSONE (DELTASONE) 10 MG tablet Sig: 4 tables once a day for 3 days, 3 tablets once a day X3 days, 2 tablets a day for 3 days, 1 tablet a day for 3 days. 15 tablet 0   No facility-administered medications prior to visit.      Allergies:   Shrimp [shellfish allergy]; Tetracycline; and Penicillins   Social History   Social History  . Marital status: Married    Spouse name: N/A  . Number of children: N/A  . Years of education: N/A   Social History Main Topics  . Smoking status: Never Smoker  . Smokeless tobacco: None  . Alcohol use No  . Drug  use: No  . Sexual activity: Yes    Birth control/ protection: Surgical   Other Topics Concern  . None   Social History Narrative  . None     Family History:  The patient's family history includes Stroke in her mother.   ROS:   Please see the history of present illness.    ROS All other systems reviewed and are negative.  No flowsheet data found.     PHYSICAL EXAM:   VS:  BP (!) 142/74   Pulse 64   Ht 5\' 3"  (1.6 m)   Wt 288 lb 12.8 oz (131 kg)   BMI 51.16 kg/m    GEN: Well nourished, well developed, in no acute distress  HEENT: normal  Neck: no JVD, carotid bruits, or masses Cardiac: RRR; no murmurs, rubs, or gallops,no edema.  Intact distal pulses bilaterally.  Respiratory:  clear to auscultation bilaterally, normal work of breathing GI: soft, nontender, nondistended, + BS MS: no deformity or atrophy  Skin: warm and dry, no rash Neuro:  Alert and Oriented x 3, Strength and sensation are intact Psych: euthymic mood, full affect  Wt Readings from Last 3 Encounters:  06/04/16 288 lb 12.8 oz (131 kg)  03/08/16 299 lb (135.6 kg)  09/09/15 292 lb (132.5 kg)      Studies/Labs Reviewed:   EKG:  EKG is ordered today.  The ekg ordered today demonstrates NSR at 64bpm with no ST changes  Recent Labs: 03/08/2016: ALT 34; B Natriuretic Peptide 76.6; BUN 6; Creatinine, Ser 0.57; Hemoglobin 12.3; Platelets 229; Potassium 3.5; Sodium 138   Lipid Panel    Component Value Date/Time   CHOL  05/25/2009 0605    169        ATP III CLASSIFICATION:  <200     mg/dL   Desirable  07/25/2009  mg/dL   Borderline High  656-812    mg/dL   High          TRIG >=751 05/25/2009 0605   HDL 44 05/25/2009 0605   CHOLHDL 3.8 05/25/2009 0605   VLDL 20 05/25/2009 0605   LDLCALC (H) 05/25/2009 0605    105        Total Cholesterol/HDL:CHD Risk Coronary Heart Disease Risk Table                     Men   Women  1/2 Average Risk  3.4   3.3  Average Risk       5.0   4.4  2 X Average Risk   9.6    7.1  3 X Average Risk  23.4   11.0        Use the calculated Patient Ratio above and the CHD Risk Table to determine the patient's CHD Risk.        ATP III CLASSIFICATION (LDL):  <100     mg/dL   Optimal  841-324  mg/dL   Near or Above                    Optimal  130-159  mg/dL   Borderline  401-027  mg/dL   High  >253     mg/dL   Very High    Additional studies/ records that were reviewed today include:  Hospital notes    ASSESSMENT:    1. Gasping for breath   2. Excessive daytime sleepiness   3. HYPERTENSION, BENIGN ESSENTIAL, CONTROLLED      PLAN:  In order of problems listed above:  1. Gasping for breath - this mainly only occurs when she is laying down or sleeping and she has not DOE during the day with normal activities.  Given her severe snoring, morbid obesity  and excessive daytime sleepiness and fatigue, I suspect that she has underlying OSA.  I will get a sleep study to evaluate further. I will also check a 2D echo to assess LVF.  She does not have any exertional component to it so I will hold off on stress testing for now. 2. Excessive daytime sleepiness - check sleep study 3. HTN - BP controlled on current meds.  Continue clonidine/ACE I/BB and diuretic.     Medication Adjustments/Labs and Tests Ordered: Current medicines are reviewed at length with the patient today.  Concerns regarding medicines are outlined above.  Medication changes, Labs and Tests ordered today are listed in the Patient Instructions below.  There are no Patient Instructions on file for this visit.   Signed, Armanda Magic, MD  06/04/2016 2:40 PM    [facility] [address] [place], [place], [place]  [address] Phone: [phone]; Fax: [phone]

## 2016-06-05 DIAGNOSIS — M549 Dorsalgia, unspecified: Secondary | ICD-10-CM | POA: Diagnosis not present

## 2016-06-06 DIAGNOSIS — M549 Dorsalgia, unspecified: Secondary | ICD-10-CM | POA: Diagnosis not present

## 2016-06-09 DIAGNOSIS — M549 Dorsalgia, unspecified: Secondary | ICD-10-CM | POA: Diagnosis not present

## 2016-06-10 ENCOUNTER — Encounter: Payer: Self-pay | Admitting: Gastroenterology

## 2016-06-10 DIAGNOSIS — M549 Dorsalgia, unspecified: Secondary | ICD-10-CM | POA: Diagnosis not present

## 2016-06-11 DIAGNOSIS — M549 Dorsalgia, unspecified: Secondary | ICD-10-CM | POA: Diagnosis not present

## 2016-06-12 DIAGNOSIS — M549 Dorsalgia, unspecified: Secondary | ICD-10-CM | POA: Diagnosis not present

## 2016-06-13 DIAGNOSIS — M549 Dorsalgia, unspecified: Secondary | ICD-10-CM | POA: Diagnosis not present

## 2016-06-16 DIAGNOSIS — M549 Dorsalgia, unspecified: Secondary | ICD-10-CM | POA: Diagnosis not present

## 2016-06-17 DIAGNOSIS — M549 Dorsalgia, unspecified: Secondary | ICD-10-CM | POA: Diagnosis not present

## 2016-06-18 DIAGNOSIS — M549 Dorsalgia, unspecified: Secondary | ICD-10-CM | POA: Diagnosis not present

## 2016-06-19 DIAGNOSIS — M549 Dorsalgia, unspecified: Secondary | ICD-10-CM | POA: Diagnosis not present

## 2016-06-20 DIAGNOSIS — M549 Dorsalgia, unspecified: Secondary | ICD-10-CM | POA: Diagnosis not present

## 2016-06-23 ENCOUNTER — Other Ambulatory Visit: Payer: Self-pay

## 2016-06-23 ENCOUNTER — Ambulatory Visit (HOSPITAL_COMMUNITY): Payer: Commercial Managed Care - HMO | Attending: Cardiovascular Disease

## 2016-06-23 DIAGNOSIS — M549 Dorsalgia, unspecified: Secondary | ICD-10-CM | POA: Diagnosis not present

## 2016-06-23 DIAGNOSIS — I110 Hypertensive heart disease with heart failure: Secondary | ICD-10-CM | POA: Diagnosis not present

## 2016-06-23 DIAGNOSIS — I509 Heart failure, unspecified: Secondary | ICD-10-CM | POA: Insufficient documentation

## 2016-06-23 DIAGNOSIS — I10 Essential (primary) hypertension: Secondary | ICD-10-CM

## 2016-06-23 DIAGNOSIS — Z6841 Body Mass Index (BMI) 40.0 and over, adult: Secondary | ICD-10-CM | POA: Insufficient documentation

## 2016-06-23 DIAGNOSIS — R0689 Other abnormalities of breathing: Secondary | ICD-10-CM | POA: Insufficient documentation

## 2016-06-24 DIAGNOSIS — M549 Dorsalgia, unspecified: Secondary | ICD-10-CM | POA: Diagnosis not present

## 2016-06-25 DIAGNOSIS — M549 Dorsalgia, unspecified: Secondary | ICD-10-CM | POA: Diagnosis not present

## 2016-06-26 ENCOUNTER — Telehealth: Payer: Self-pay | Admitting: *Deleted

## 2016-06-26 DIAGNOSIS — M549 Dorsalgia, unspecified: Secondary | ICD-10-CM | POA: Diagnosis not present

## 2016-06-26 NOTE — Telephone Encounter (Signed)
Spoke to the patient to see if she had received her packet for her sleep study and she had but didn't realize it. She only saw her appointment time.  She now verbalizes understanding

## 2016-06-26 NOTE — Telephone Encounter (Signed)
-----   Message from Henrietta Dine, RN sent at 06/26/2016  2:00 PM EST ----- Regarding: patient requests a call  I just called this patient to review ECHO results and she was asking a lot of questions about the sleep study process. I told her I'd ask you to call her and send her a packet (she said she hasn't received anything).  Thanks!

## 2016-06-27 DIAGNOSIS — M549 Dorsalgia, unspecified: Secondary | ICD-10-CM | POA: Diagnosis not present

## 2016-06-30 DIAGNOSIS — M549 Dorsalgia, unspecified: Secondary | ICD-10-CM | POA: Diagnosis not present

## 2016-07-01 DIAGNOSIS — M549 Dorsalgia, unspecified: Secondary | ICD-10-CM | POA: Diagnosis not present

## 2016-07-02 DIAGNOSIS — E669 Obesity, unspecified: Secondary | ICD-10-CM | POA: Diagnosis not present

## 2016-07-02 DIAGNOSIS — M549 Dorsalgia, unspecified: Secondary | ICD-10-CM | POA: Diagnosis not present

## 2016-07-02 DIAGNOSIS — I10 Essential (primary) hypertension: Secondary | ICD-10-CM | POA: Diagnosis not present

## 2016-07-02 DIAGNOSIS — Z6841 Body Mass Index (BMI) 40.0 and over, adult: Secondary | ICD-10-CM | POA: Diagnosis not present

## 2016-07-02 DIAGNOSIS — E119 Type 2 diabetes mellitus without complications: Secondary | ICD-10-CM | POA: Diagnosis not present

## 2016-07-03 DIAGNOSIS — M549 Dorsalgia, unspecified: Secondary | ICD-10-CM | POA: Diagnosis not present

## 2016-07-04 DIAGNOSIS — M549 Dorsalgia, unspecified: Secondary | ICD-10-CM | POA: Diagnosis not present

## 2016-07-07 DIAGNOSIS — M549 Dorsalgia, unspecified: Secondary | ICD-10-CM | POA: Diagnosis not present

## 2016-07-08 DIAGNOSIS — M549 Dorsalgia, unspecified: Secondary | ICD-10-CM | POA: Diagnosis not present

## 2016-07-09 DIAGNOSIS — M549 Dorsalgia, unspecified: Secondary | ICD-10-CM | POA: Diagnosis not present

## 2016-07-11 DIAGNOSIS — M549 Dorsalgia, unspecified: Secondary | ICD-10-CM | POA: Diagnosis not present

## 2016-07-14 DIAGNOSIS — M549 Dorsalgia, unspecified: Secondary | ICD-10-CM | POA: Diagnosis not present

## 2016-07-15 DIAGNOSIS — M549 Dorsalgia, unspecified: Secondary | ICD-10-CM | POA: Diagnosis not present

## 2016-07-16 DIAGNOSIS — M549 Dorsalgia, unspecified: Secondary | ICD-10-CM | POA: Diagnosis not present

## 2016-07-17 DIAGNOSIS — M549 Dorsalgia, unspecified: Secondary | ICD-10-CM | POA: Diagnosis not present

## 2016-07-18 DIAGNOSIS — M549 Dorsalgia, unspecified: Secondary | ICD-10-CM | POA: Diagnosis not present

## 2016-07-21 ENCOUNTER — Ambulatory Visit (HOSPITAL_BASED_OUTPATIENT_CLINIC_OR_DEPARTMENT_OTHER): Payer: Commercial Managed Care - HMO | Attending: Cardiology | Admitting: Cardiology

## 2016-07-21 VITALS — Ht 63.0 in | Wt 286.0 lb

## 2016-07-21 DIAGNOSIS — E119 Type 2 diabetes mellitus without complications: Secondary | ICD-10-CM | POA: Diagnosis not present

## 2016-07-21 DIAGNOSIS — R0689 Other abnormalities of breathing: Secondary | ICD-10-CM

## 2016-07-21 DIAGNOSIS — M549 Dorsalgia, unspecified: Secondary | ICD-10-CM | POA: Diagnosis not present

## 2016-07-21 DIAGNOSIS — G4733 Obstructive sleep apnea (adult) (pediatric): Secondary | ICD-10-CM | POA: Diagnosis not present

## 2016-07-21 DIAGNOSIS — J45998 Other asthma: Secondary | ICD-10-CM | POA: Diagnosis not present

## 2016-07-21 DIAGNOSIS — J208 Acute bronchitis due to other specified organisms: Secondary | ICD-10-CM | POA: Diagnosis not present

## 2016-07-21 DIAGNOSIS — Z6841 Body Mass Index (BMI) 40.0 and over, adult: Secondary | ICD-10-CM | POA: Diagnosis not present

## 2016-07-21 DIAGNOSIS — I10 Essential (primary) hypertension: Secondary | ICD-10-CM | POA: Diagnosis not present

## 2016-07-21 DIAGNOSIS — I509 Heart failure, unspecified: Secondary | ICD-10-CM | POA: Diagnosis not present

## 2016-07-21 DIAGNOSIS — G4710 Hypersomnia, unspecified: Secondary | ICD-10-CM | POA: Diagnosis not present

## 2016-07-21 DIAGNOSIS — G4719 Other hypersomnia: Secondary | ICD-10-CM

## 2016-07-22 DIAGNOSIS — M549 Dorsalgia, unspecified: Secondary | ICD-10-CM | POA: Diagnosis not present

## 2016-07-23 DIAGNOSIS — M549 Dorsalgia, unspecified: Secondary | ICD-10-CM | POA: Diagnosis not present

## 2016-07-24 ENCOUNTER — Telehealth: Payer: Self-pay | Admitting: Cardiology

## 2016-07-24 DIAGNOSIS — M549 Dorsalgia, unspecified: Secondary | ICD-10-CM | POA: Diagnosis not present

## 2016-07-24 NOTE — Telephone Encounter (Signed)
Pt calling regarding doing a sleep study-pls call pt [phone]

## 2016-07-25 DIAGNOSIS — M549 Dorsalgia, unspecified: Secondary | ICD-10-CM | POA: Diagnosis not present

## 2016-07-28 NOTE — Telephone Encounter (Signed)
Spoke to the patient she wanted to know her results of her study she verbalized understanding

## 2016-08-03 NOTE — Procedures (Signed)
Patient Name: Katelyn Howard, Katelyn Howard Date: 07/21/2016 Gender: Female D.O.B: [birth year] Age (years): 78 Referring Provider: Fransico Him MD, ABSM Height (inches): 62 Interpreting Physician: Fransico Him MD, ABSM Weight (lbs): 283 RPSGT: Madelon Lips BMI: 52 MRN: [id number] Neck Size: 17.00  CLINICAL INFORMATION Sleep Study Type: Split Night CPAP  Indication for sleep study: Excessive Daytime Sleepiness  Epworth Sleepiness Score: 14  SLEEP STUDY TECHNIQUE As per the AASM Manual for the Scoring of Sleep and Associated Events v2.3 (April 2016) with a hypopnea requiring 4% desaturations.  The channels recorded and monitored were frontal, central and occipital EEG, electrooculogram (EOG), submentalis EMG (chin), nasal and oral airflow, thoracic and abdominal wall motion, anterior tibialis EMG, snore microphone, electrocardiogram, and pulse oximetry. Continuous positive airway pressure (CPAP) was initiated when the patient met split night criteria and was titrated according to treat sleep-disordered breathing.  MEDICATIONS Medications self-administered by patient taken the night of the study : N/A  RESPIRATORY PARAMETERS Diagnostic Total AHI (/hr): 44.9  RDI (/hr):49.0  OA Index (/hr): 8.6  CA Index (/hr): 0.0 REM AHI (/hr): N/A  NREM AHI (/hr):44.9 Supine AHI (/hr):N/A  Non-supine AHI (/hr):44.90 Min O2 Sat (%):83.00  Mean O2 (%):96.69 Time below 88% (min):6.5   Titration Optimal Pressure (cm):11  AHI at Optimal Pressure (/hr):0.0  Min O2 at Optimal Pressure (%):96.0 Supine % at Optimal (%):0  Sleep % at Optimal (%):100    SLEEP ARCHITECTURE The recording time for the entire night was 404.6 minutes.  During a baseline period of 219.2 minutes, the patient slept for 147.0 minutes in REM and nonREM, yielding a sleep efficiency of 67.1%. Sleep onset after lights out was 10.2 minutes with a REM latency of N/A minutes. The patient spent 28.24% of the night in stage N1 sleep,  71.76% in stage N2 sleep, 0.00% in stage N3 and 0.00% in REM.  During the titration period of 177.0 minutes, the patient slept for 140.4 minutes in REM and nonREM, yielding a sleep efficiency of 79.3%. Sleep onset after CPAP initiation was 2.6 minutes with a REM latency of 116.5 minutes. The patient spent 19.94% of the night in stage N1 sleep, 72.23% in stage N2 sleep, 0.00% in stage N3 and 7.83% in REM.  CARDIAC DATA The 2 lead EKG demonstrated sinus rhythm. The mean heart rate was 59.70 beats per minute. Other EKG findings include: None.  LEG MOVEMENT DATA The total Periodic Limb Movements of Sleep (PLMS) were 0. The PLMS index was 0.00 .  IMPRESSIONS - Severe obstructive sleep apnea occurred during the diagnostic portion of the study (AHI = 44.9/hour). An optimal PAP pressure was selected for this patient ( 11 cm of water) - No significant central sleep apnea occurred during the diagnostic portion of the study (CAI = 0.0/hour). - Moderate oxygen desaturation was noted during the diagnostic portion of the study (Min O2 =83.00%). - The patient snored with Loud snoring volume during the diagnostic portion of the study. - No cardiac abnormalities were noted during this study. - Clinically significant periodic limb movements did not occur during sleep.  DIAGNOSIS - Obstructive Sleep Apnea (327.23 [G47.33 ICD-10]) - Nocturnal Hypoxemia  RECOMMENDATIONS - Trial of CPAP therapy on 11 cm H2O with a Medium size Resmed Full Face Mask AirFit F20 mask and heated humidification. - Avoid alcohol, sedatives and other CNS depressants that may worsen sleep apnea and disrupt normal sleep architecture. - Sleep hygiene should be reviewed to assess factors that may improve sleep quality. - Weight management and regular  exercise should be initiated or continued. - Return to Sleep Center for re-evaluation after 10 weeks of therapy  [place], [place] [facility]  ELECTRONICALLY  SIGNED ON:  08/03/2016, 10:19 PM [place] PH: [phone]   FX: [phone] [place]

## 2016-08-04 DIAGNOSIS — M549 Dorsalgia, unspecified: Secondary | ICD-10-CM | POA: Diagnosis not present

## 2016-08-04 NOTE — Progress Notes (Signed)
Called the patient and gave results she verbalized understanding

## 2016-08-05 ENCOUNTER — Ambulatory Visit: Payer: Commercial Managed Care - HMO | Admitting: Gastroenterology

## 2016-08-05 DIAGNOSIS — M549 Dorsalgia, unspecified: Secondary | ICD-10-CM | POA: Diagnosis not present

## 2016-08-06 DIAGNOSIS — M549 Dorsalgia, unspecified: Secondary | ICD-10-CM | POA: Diagnosis not present

## 2016-08-07 DIAGNOSIS — M549 Dorsalgia, unspecified: Secondary | ICD-10-CM | POA: Diagnosis not present

## 2016-08-08 DIAGNOSIS — M549 Dorsalgia, unspecified: Secondary | ICD-10-CM | POA: Diagnosis not present

## 2016-08-12 DIAGNOSIS — M549 Dorsalgia, unspecified: Secondary | ICD-10-CM | POA: Diagnosis not present

## 2016-08-13 DIAGNOSIS — M549 Dorsalgia, unspecified: Secondary | ICD-10-CM | POA: Diagnosis not present

## 2016-08-14 DIAGNOSIS — M549 Dorsalgia, unspecified: Secondary | ICD-10-CM | POA: Diagnosis not present

## 2016-08-15 DIAGNOSIS — M549 Dorsalgia, unspecified: Secondary | ICD-10-CM | POA: Diagnosis not present

## 2016-08-19 DIAGNOSIS — M549 Dorsalgia, unspecified: Secondary | ICD-10-CM | POA: Diagnosis not present

## 2016-08-20 DIAGNOSIS — M549 Dorsalgia, unspecified: Secondary | ICD-10-CM | POA: Diagnosis not present

## 2016-08-21 DIAGNOSIS — G4733 Obstructive sleep apnea (adult) (pediatric): Secondary | ICD-10-CM | POA: Diagnosis not present

## 2016-08-21 DIAGNOSIS — M549 Dorsalgia, unspecified: Secondary | ICD-10-CM | POA: Diagnosis not present

## 2016-08-22 DIAGNOSIS — M549 Dorsalgia, unspecified: Secondary | ICD-10-CM | POA: Diagnosis not present

## 2016-08-25 DIAGNOSIS — M549 Dorsalgia, unspecified: Secondary | ICD-10-CM | POA: Diagnosis not present

## 2016-08-26 DIAGNOSIS — M549 Dorsalgia, unspecified: Secondary | ICD-10-CM | POA: Diagnosis not present

## 2016-08-27 DIAGNOSIS — M549 Dorsalgia, unspecified: Secondary | ICD-10-CM | POA: Diagnosis not present

## 2016-08-28 DIAGNOSIS — M549 Dorsalgia, unspecified: Secondary | ICD-10-CM | POA: Diagnosis not present

## 2016-08-29 DIAGNOSIS — M549 Dorsalgia, unspecified: Secondary | ICD-10-CM | POA: Diagnosis not present

## 2016-09-02 DIAGNOSIS — M549 Dorsalgia, unspecified: Secondary | ICD-10-CM | POA: Diagnosis not present

## 2016-09-03 DIAGNOSIS — M549 Dorsalgia, unspecified: Secondary | ICD-10-CM | POA: Diagnosis not present

## 2016-09-04 DIAGNOSIS — M549 Dorsalgia, unspecified: Secondary | ICD-10-CM | POA: Diagnosis not present

## 2016-09-05 DIAGNOSIS — M549 Dorsalgia, unspecified: Secondary | ICD-10-CM | POA: Diagnosis not present

## 2016-09-08 DIAGNOSIS — M549 Dorsalgia, unspecified: Secondary | ICD-10-CM | POA: Diagnosis not present

## 2016-09-09 DIAGNOSIS — M549 Dorsalgia, unspecified: Secondary | ICD-10-CM | POA: Diagnosis not present

## 2016-09-10 DIAGNOSIS — M549 Dorsalgia, unspecified: Secondary | ICD-10-CM | POA: Diagnosis not present

## 2016-09-11 DIAGNOSIS — M549 Dorsalgia, unspecified: Secondary | ICD-10-CM | POA: Diagnosis not present

## 2016-09-12 DIAGNOSIS — M549 Dorsalgia, unspecified: Secondary | ICD-10-CM | POA: Diagnosis not present

## 2016-09-15 DIAGNOSIS — M549 Dorsalgia, unspecified: Secondary | ICD-10-CM | POA: Diagnosis not present

## 2016-09-16 DIAGNOSIS — M549 Dorsalgia, unspecified: Secondary | ICD-10-CM | POA: Diagnosis not present

## 2016-09-17 DIAGNOSIS — M549 Dorsalgia, unspecified: Secondary | ICD-10-CM | POA: Diagnosis not present

## 2016-09-18 DIAGNOSIS — M549 Dorsalgia, unspecified: Secondary | ICD-10-CM | POA: Diagnosis not present

## 2016-09-19 DIAGNOSIS — M549 Dorsalgia, unspecified: Secondary | ICD-10-CM | POA: Diagnosis not present

## 2016-09-21 DIAGNOSIS — G4733 Obstructive sleep apnea (adult) (pediatric): Secondary | ICD-10-CM | POA: Diagnosis not present

## 2016-09-22 ENCOUNTER — Ambulatory Visit: Payer: Commercial Managed Care - HMO | Admitting: Gastroenterology

## 2016-09-22 DIAGNOSIS — M549 Dorsalgia, unspecified: Secondary | ICD-10-CM | POA: Diagnosis not present

## 2016-09-23 DIAGNOSIS — M549 Dorsalgia, unspecified: Secondary | ICD-10-CM | POA: Diagnosis not present

## 2016-09-24 DIAGNOSIS — G4733 Obstructive sleep apnea (adult) (pediatric): Secondary | ICD-10-CM | POA: Diagnosis not present

## 2016-09-24 DIAGNOSIS — M549 Dorsalgia, unspecified: Secondary | ICD-10-CM | POA: Diagnosis not present

## 2016-09-25 DIAGNOSIS — M549 Dorsalgia, unspecified: Secondary | ICD-10-CM | POA: Diagnosis not present

## 2016-09-26 DIAGNOSIS — M549 Dorsalgia, unspecified: Secondary | ICD-10-CM | POA: Diagnosis not present

## 2016-09-29 DIAGNOSIS — M549 Dorsalgia, unspecified: Secondary | ICD-10-CM | POA: Diagnosis not present

## 2016-09-30 DIAGNOSIS — M549 Dorsalgia, unspecified: Secondary | ICD-10-CM | POA: Diagnosis not present

## 2016-10-01 DIAGNOSIS — M549 Dorsalgia, unspecified: Secondary | ICD-10-CM | POA: Diagnosis not present

## 2016-10-02 DIAGNOSIS — M549 Dorsalgia, unspecified: Secondary | ICD-10-CM | POA: Diagnosis not present

## 2016-10-03 ENCOUNTER — Encounter: Payer: Self-pay | Admitting: Cardiology

## 2016-10-03 DIAGNOSIS — M549 Dorsalgia, unspecified: Secondary | ICD-10-CM | POA: Diagnosis not present

## 2016-10-06 DIAGNOSIS — M549 Dorsalgia, unspecified: Secondary | ICD-10-CM | POA: Diagnosis not present

## 2016-10-07 DIAGNOSIS — M549 Dorsalgia, unspecified: Secondary | ICD-10-CM | POA: Diagnosis not present

## 2016-10-08 DIAGNOSIS — M549 Dorsalgia, unspecified: Secondary | ICD-10-CM | POA: Diagnosis not present

## 2016-10-09 ENCOUNTER — Encounter: Payer: Self-pay | Admitting: Cardiology

## 2016-10-09 DIAGNOSIS — M549 Dorsalgia, unspecified: Secondary | ICD-10-CM | POA: Diagnosis not present

## 2016-10-10 DIAGNOSIS — M549 Dorsalgia, unspecified: Secondary | ICD-10-CM | POA: Diagnosis not present

## 2016-10-13 ENCOUNTER — Encounter (INDEPENDENT_AMBULATORY_CARE_PROVIDER_SITE_OTHER): Payer: Self-pay

## 2016-10-13 ENCOUNTER — Ambulatory Visit (INDEPENDENT_AMBULATORY_CARE_PROVIDER_SITE_OTHER): Payer: Medicare HMO | Admitting: Cardiology

## 2016-10-13 ENCOUNTER — Encounter: Payer: Self-pay | Admitting: Cardiology

## 2016-10-13 VITALS — BP 158/80 | HR 67 | Ht 63.0 in | Wt 289.8 lb

## 2016-10-13 DIAGNOSIS — M549 Dorsalgia, unspecified: Secondary | ICD-10-CM | POA: Diagnosis not present

## 2016-10-13 DIAGNOSIS — R0689 Other abnormalities of breathing: Secondary | ICD-10-CM

## 2016-10-13 DIAGNOSIS — I10 Essential (primary) hypertension: Secondary | ICD-10-CM | POA: Diagnosis not present

## 2016-10-13 DIAGNOSIS — E6609 Other obesity due to excess calories: Secondary | ICD-10-CM | POA: Diagnosis not present

## 2016-10-13 DIAGNOSIS — G4733 Obstructive sleep apnea (adult) (pediatric): Secondary | ICD-10-CM | POA: Insufficient documentation

## 2016-10-13 HISTORY — DX: Obstructive sleep apnea (adult) (pediatric): G47.33

## 2016-10-13 NOTE — Patient Instructions (Signed)
Medication Instructions:  Your physician recommends that you continue on your current medications as directed. Please refer to the Current Medication list given to you today.   Labwork: None  Testing/Procedures: Dr. Mayford Knife recommends you wear a 24 HOUR BLOOD PRESSURE MONITOR.  Follow-Up: Your physician wants you to follow-up in: 1 year with Dr. Mayford Knife. You will receive a reminder letter in the mail two months in advance. If you don't receive a letter, please call our office to schedule the follow-up appointment.   Any Other Special Instructions Will Be Listed Below (If Applicable).     If you need a refill on your cardiac medications before your next appointment, please call your pharmacy.

## 2016-10-13 NOTE — Progress Notes (Signed)
Cardiology Office Note    Date:  10/13/2016   ID:  KELY DOHN, DOB 07/20/1961, MRN [id number]  PCP:  Ezequiel Kayser, MD  Cardiologist:  Armanda Magic, MD   Chief Complaint  Patient presents with  . Sleep Apnea  . Hypertension    History of Present Illness:  Katelyn Howard is a 56 y.o. female with a history of asthma, HTN and obesity who is here for followup of OSA.   She was diagnosed with severe OSA with an AHI of 44.9/hr and underwent CPAP titration to 11cm H2O.  She is now here for her followup.  She is tolerating her CPAP device well.  She tolerates her full face mask and feels the pressure is adequate.  Since going on CPAP, she feels more rested in the am and has less daytime sleepiness.  Her night time SOB has resolved.  She thinks that she snores still occasionally.   She does not sleep on her back. She has had some LE edema that is chronic and controlled on diuretics.    Past Medical History:  Diagnosis Date  . Arthritis   . Asthma   . Diabetes mellitus   . Edema extremities   . GERD (gastroesophageal reflux disease)   . Hyperlipidemia   . Hypertension   . Obesity   . OSA (obstructive sleep apnea) 10/13/2016   Severe with AHI 44.9/hr now on CPAP at 11cm H2O    Past Surgical History:  Procedure Laterality Date  . ABDOMINAL HYSTERECTOMY    . EYE SURGERY      Current Medications: Current Meds  Medication Sig  . acetaminophen (TYLENOL) 500 MG tablet Take 1 tablet (500 mg total) by mouth every 6 (six) hours as needed.  Marland Kitchen albuterol (PROVENTIL HFA;VENTOLIN HFA) 108 (90 BASE) MCG/ACT inhaler Inhale 2 puffs into the lungs every 6 (six) hours as needed. For wheezing  . aspirin EC 81 MG tablet Take 81 mg by mouth daily.  Marland Kitchen atorvastatin (LIPITOR) 80 MG tablet Take 80 mg by mouth every morning.   Marland Kitchen azithromycin (ZITHROMAX) 250 MG tablet Take first 2 tablets together, then 1 every day until finished.  . Cholecalciferol 50000 units capsule Take 50,000 Units by  mouth every Thursday.   . cloNIDine (CATAPRES) 0.1 MG tablet Take 1 tablet (0.1 mg total) by mouth 2 (two) times daily.  . clotrimazole (LOTRIMIN) 1 % cream Apply 1 application topically daily as needed (for rash).  . clotrimazole-betamethasone (LOTRISONE) cream Apply 1 application topically 2 (two) times daily as needed (for rash).  Marland Kitchen esomeprazole (NEXIUM) 40 MG capsule Take 40 mg by mouth 2 (two) times daily before a meal.   . fluticasone (CUTIVATE) 0.05 % cream Apply topically 2 (two) times daily.  . fluticasone (FLONASE) 50 MCG/ACT nasal spray Place 1 spray into both nostrils 2 (two) times daily.  . furosemide (LASIX) 20 MG tablet Take 20 mg by mouth daily.  Marland Kitchen glimepiride (AMARYL) 4 MG tablet Take 4 mg by mouth daily with breakfast.   . HYDROcodone-acetaminophen (NORCO/VICODIN) 5-325 MG tablet Take 1 tablet by mouth as directed.  . hydrOXYzine (ATARAX/VISTARIL) 25 MG tablet Take 1 tablet (25 mg total) by mouth every 6 (six) hours. Prn itching  . insulin detemir (LEVEMIR) 100 UNIT/ML injection Inject 80 Units into the skin 2 (two) times daily.   Marland Kitchen LEVEMIR FLEXTOUCH 100 UNIT/ML Pen Inject 80 Units as directed 2 (two) times daily.  Marland Kitchen lisinopril-hydrochlorothiazide (PRINZIDE,ZESTORETIC) 20-25 MG tablet Take 1 tablet by mouth  daily.  . methocarbamol (ROBAXIN) 500 MG tablet Take 1 tablet by mouth daily.  . nebivolol (BYSTOLIC) 5 MG tablet Take 5 mg by mouth daily.  . ondansetron (ZOFRAN) 4 MG tablet Take 1 tablet (4 mg total) by mouth every 6 (six) hours.  . potassium chloride (K-DUR) 10 MEQ tablet Take 10 mEq by mouth daily.   . predniSONE (DELTASONE) 10 MG tablet Sig: 4 tables once a day for 3 days, 3 tablets once a day X3 days, 2 tablets a day for 3 days, 1 tablet a day for 3 days.  . TRULICITY 1.5 MG/0.5ML SOPN Inject 1.5 mg as directed once a week.    Allergies:   Shrimp [shellfish allergy]; Tetracycline; and Penicillins   Social History   Social History  . Marital status: Married     Spouse name: N/A  . Number of children: N/A  . Years of education: N/A   Social History Main Topics  . Smoking status: Never Smoker  . Smokeless tobacco: Never Used  . Alcohol use No  . Drug use: No  . Sexual activity: Yes    Birth control/ protection: Surgical   Other Topics Concern  . None   Social History Narrative  . None     Family History:  The patient's family history includes Arthritis in her father; Diabetes in her brother and sister; Gout in her father; Heart attack in her sister; Hypertension in her father and sister; Stroke in her brother and mother.   ROS:   Please see the history of present illness.    ROS All other systems reviewed and are negative.  No flowsheet data found.     PHYSICAL EXAM:   VS:  BP (!) 158/80   Pulse 67   Ht 5\' 3"  (1.6 m)   Wt 289 lb 12.8 oz (131.5 kg)   SpO2 97%   BMI 51.34 kg/m    GEN: Well nourished, well developed, in no acute distress  HEENT: normal  Neck: no JVD, carotid bruits, or masses Cardiac: RRR; no murmurs, rubs, or gallops,no edema.  Intact distal pulses bilaterally.  Respiratory:  clear to auscultation bilaterally, normal work of breathing GI: soft, nontender, nondistended, + BS MS: no deformity or atrophy  Skin: warm and dry, no rash Neuro:  Alert and Oriented x 3, Strength and sensation are intact Psych: euthymic mood, full affect  Wt Readings from Last 3 Encounters:  10/13/16 289 lb 12.8 oz (131.5 kg)  07/21/16 286 lb (129.7 kg)  06/04/16 288 lb 12.8 oz (131 kg)      Studies/Labs Reviewed:   EKG:  EKG is not ordered today.    Recent Labs: 03/08/2016: ALT 34; B Natriuretic Peptide 76.6; BUN 6; Creatinine, Ser 0.57; Hemoglobin 12.3; Platelets 229; Potassium 3.5; Sodium 138   Lipid Panel    Component Value Date/Time   CHOL  05/25/2009 0605    169        ATP III CLASSIFICATION:  <200     mg/dL   Desirable  07/25/2009  mg/dL   Borderline High  448-185    mg/dL   High          TRIG >=631 05/25/2009 0605     HDL 44 05/25/2009 0605   CHOLHDL 3.8 05/25/2009 0605   VLDL 20 05/25/2009 0605   LDLCALC (H) 05/25/2009 0605    105        Total Cholesterol/HDL:CHD Risk Coronary Heart Disease Risk Table  Men   Women  1/2 Average Risk   3.4   3.3  Average Risk       5.0   4.4  2 X Average Risk   9.6   7.1  3 X Average Risk  23.4   11.0        Use the calculated Patient Ratio above and the CHD Risk Table to determine the patient's CHD Risk.        ATP III CLASSIFICATION (LDL):  <100     mg/dL   Optimal  122-449  mg/dL   Near or Above                    Optimal  130-159  mg/dL   Borderline  753-005  mg/dL   High  >110     mg/dL   Very High    Additional studies/ records that were reviewed today include:  CPAP download    ASSESSMENT:    1. Gasping for breath   2. OSA (obstructive sleep apnea)   3. HYPERTENSION, BENIGN ESSENTIAL, CONTROLLED   4. Class 1 obesity due to excess calories with serious comorbidity in adult, unspecified BMI      PLAN:  In order of problems listed above:  1. Gasping for breath at night - this has resolved on CPAP therapy. OSA - the patient is tolerating PAP therapy well without any problems. The PAP download was reviewed today and showed an AHI of 0.6/hr on 11 cm H2O with 40% compliance in using more than 4 hours nightly.  The patient has been using and benefiting from CPAP use and will continue to benefit from therapy.  HTN - BP still poorly controlled on current meds but she just took her BP meds right before she came.  I will get a 24 hour BP monitor to assess for adequacy of control.  Obesity - I have encouraged him to get into a routine exercise program and cut back on carbs and portions. Her exercise has been limited due to back pain.      Medication Adjustments/Labs and Tests Ordered: Current medicines are reviewed at length with the patient today.  Concerns regarding medicines are outlined above.  Medication changes, Labs and  Tests ordered today are listed in the Patient Instructions below.  There are no Patient Instructions on file for this visit.   Signed, Armanda Magic, MD  10/13/2016 11:21 AM    [facility] [address] [place], [place], [place]  [address] Phone: [phone]; Fax: [phone]

## 2016-10-14 DIAGNOSIS — M549 Dorsalgia, unspecified: Secondary | ICD-10-CM | POA: Diagnosis not present

## 2016-10-15 DIAGNOSIS — M549 Dorsalgia, unspecified: Secondary | ICD-10-CM | POA: Diagnosis not present

## 2016-10-16 DIAGNOSIS — M549 Dorsalgia, unspecified: Secondary | ICD-10-CM | POA: Diagnosis not present

## 2016-10-17 ENCOUNTER — Encounter (HOSPITAL_COMMUNITY): Payer: Self-pay

## 2016-10-17 ENCOUNTER — Emergency Department (HOSPITAL_COMMUNITY)
Admission: EM | Admit: 2016-10-17 | Discharge: 2016-10-17 | Disposition: A | Discharge status: Home / Self Care | Payer: Medicare HMO | Attending: Emergency Medicine | Admitting: Emergency Medicine

## 2016-10-17 ENCOUNTER — Emergency Department (HOSPITAL_COMMUNITY): Payer: Medicare HMO

## 2016-10-17 DIAGNOSIS — I509 Heart failure, unspecified: Secondary | ICD-10-CM | POA: Diagnosis not present

## 2016-10-17 DIAGNOSIS — R519 Headache, unspecified: Secondary | ICD-10-CM

## 2016-10-17 DIAGNOSIS — D649 Anemia, unspecified: Secondary | ICD-10-CM | POA: Diagnosis not present

## 2016-10-17 DIAGNOSIS — Z79899 Other long term (current) drug therapy: Secondary | ICD-10-CM | POA: Diagnosis not present

## 2016-10-17 DIAGNOSIS — J45909 Unspecified asthma, uncomplicated: Secondary | ICD-10-CM | POA: Insufficient documentation

## 2016-10-17 DIAGNOSIS — M549 Dorsalgia, unspecified: Secondary | ICD-10-CM | POA: Diagnosis not present

## 2016-10-17 DIAGNOSIS — R51 Headache: Secondary | ICD-10-CM | POA: Diagnosis not present

## 2016-10-17 DIAGNOSIS — G8929 Other chronic pain: Secondary | ICD-10-CM

## 2016-10-17 DIAGNOSIS — Z7984 Long term (current) use of oral hypoglycemic drugs: Secondary | ICD-10-CM | POA: Insufficient documentation

## 2016-10-17 DIAGNOSIS — E119 Type 2 diabetes mellitus without complications: Secondary | ICD-10-CM | POA: Insufficient documentation

## 2016-10-17 DIAGNOSIS — I10 Essential (primary) hypertension: Secondary | ICD-10-CM | POA: Insufficient documentation

## 2016-10-17 DIAGNOSIS — Z7982 Long term (current) use of aspirin: Secondary | ICD-10-CM | POA: Insufficient documentation

## 2016-10-17 LAB — CBC
HEMATOCRIT: 37.2 % (ref 36.0–46.0)
HEMOGLOBIN: 11.7 g/dL — AB (ref 12.0–15.0)
MCH: 25.8 pg — ABNORMAL LOW (ref 26.0–34.0)
MCHC: 31.5 g/dL (ref 30.0–36.0)
MCV: 81.9 fL (ref 78.0–100.0)
Platelets: 238 10*3/uL (ref 150–400)
RBC: 4.54 MIL/uL (ref 3.87–5.11)
RDW: 14.9 % (ref 11.5–15.5)
WBC: 8.7 10*3/uL (ref 4.0–10.5)

## 2016-10-17 LAB — BASIC METABOLIC PANEL
ANION GAP: 8 (ref 5–15)
BUN: 9 mg/dL (ref 6–20)
CHLORIDE: 105 mmol/L (ref 101–111)
CO2: 26 mmol/L (ref 22–32)
Calcium: 9.1 mg/dL (ref 8.9–10.3)
Creatinine, Ser: 0.71 mg/dL (ref 0.44–1.00)
Glucose, Bld: 141 mg/dL — ABNORMAL HIGH (ref 65–99)
POTASSIUM: 3.9 mmol/L (ref 3.5–5.1)
SODIUM: 139 mmol/L (ref 135–145)

## 2016-10-17 LAB — I-STAT TROPONIN, ED
TROPONIN I, POC: 0 ng/mL (ref 0.00–0.08)
Troponin i, poc: 0 ng/mL (ref 0.00–0.08)

## 2016-10-17 MED ORDER — METOCLOPRAMIDE HCL 5 MG/ML IJ SOLN
10.0000 mg | Freq: Once | INTRAMUSCULAR | Status: AC
  Administered 2016-10-17: 10 mg via INTRAVENOUS
  Filled 2016-10-17: qty 2

## 2016-10-17 MED ORDER — DIPHENHYDRAMINE HCL 50 MG/ML IJ SOLN
25.0000 mg | Freq: Once | INTRAMUSCULAR | Status: AC
  Administered 2016-10-17: 25 mg via INTRAVENOUS
  Filled 2016-10-17: qty 1

## 2016-10-17 MED ORDER — SODIUM CHLORIDE 0.9 % IV BOLUS (SEPSIS)
1000.0000 mL | Freq: Once | INTRAVENOUS | Status: AC
  Administered 2016-10-17: 1000 mL via INTRAVENOUS

## 2016-10-17 MED ORDER — LISINOPRIL 20 MG PO TABS
20.0000 mg | ORAL_TABLET | Freq: Once | ORAL | Status: AC
  Administered 2016-10-17: 20 mg via ORAL
  Filled 2016-10-17: qty 1

## 2016-10-17 MED ORDER — AMLODIPINE BESYLATE 2.5 MG PO TABS: 2.5000 mg | ORAL_TABLET | Freq: Every day | ORAL | 0 refills | Status: DC

## 2016-10-17 NOTE — ED Triage Notes (Signed)
Per Pt, Pt is coming from home with complaints of HTN, Headache, dizziness, and palpitations in her chest for the last three days. Pt denies any numbness or tingling on one side of her body, confusion, or aphasia.

## 2016-10-17 NOTE — ED Provider Notes (Signed)
[facility]-EMERGENCY DEPT Provider Note   CSN: [id number] Arrival date & time: 10/17/16  1422     History   Chief Complaint Chief Complaint  Patient presents with  . Headache  . Hypertension  . Palpitations    HPI Katelyn Howard is a 56 y.o. female.  HPI Arrives due to concerns of headache and hypertension States that she has had a headache for approximately 3-4 days No onset is clear, just slowly progressive Not thunderclap No recent trauma Not vomiting, no fever, no neck stiffness No eye pain States pain is in her forehead She has no visual changes Nothing appears to make it worse or better She has tried taking over-the-counter medications without significant relief She also has noted some palpitations intermittently but no chest pain or shortness of breath She has not passed out She denies any weakness or numbness She did note her blood pressure to be quite high She went to [facility] and check her blood pressure and it was in the 200 systolic which concerned her and so she came to the emergency department  Past Medical History:  Diagnosis Date  . Arthritis   . Asthma   . Diabetes mellitus   . Edema extremities   . GERD (gastroesophageal reflux disease)   . Hyperlipidemia   . Hypertension   . Obesity   . OSA (obstructive sleep apnea) 10/13/2016   Severe with AHI 44.9/hr now on CPAP at 11cm H2O    Patient Active Problem List   Diagnosis Date Noted  . OSA (obstructive sleep apnea) 10/13/2016  . Excessive daytime sleepiness 06/04/2016  . Gasping for breath 06/04/2016  . DM, UNCOMPLICATED, TYPE II, UNCONTROLLED 02/12/2007  . HYPERCHOLESTEROLEMIA, PURE 02/12/2007  . Obesity 02/12/2007  . HYPERTENSION, BENIGN ESSENTIAL, CONTROLLED 02/12/2007  . ALLERGIC RHINITIS 02/12/2007  . ASTHMA, CHILDHOOD 02/12/2007  . DEGENERATIVE JOINT DISEASE 02/12/2007    Past Surgical History:  Procedure Laterality Date  . ABDOMINAL HYSTERECTOMY    . EYE SURGERY      OB History     No data available       Home Medications    Prior to Admission medications   Medication Sig Start Date End Date Taking? Authorizing Provider  acetaminophen (TYLENOL) 500 MG tablet Take 1 tablet (500 mg total) by mouth every 6 (six) hours as needed. 05/04/15   Samantha Tripp Dowless, PA-C  albuterol (PROVENTIL HFA;VENTOLIN HFA) 108 (90 BASE) MCG/ACT inhaler Inhale 2 puffs into the lungs every 6 (six) hours as needed. For wheezing    Historical Provider, MD  amLODipine (NORVASC) 2.5 MG tablet Take 1 tablet (2.5 mg total) by mouth daily. 10/17/16   Sidney Ace, MD  aspirin EC 81 MG tablet Take 81 mg by mouth daily.    Historical Provider, MD  atorvastatin (LIPITOR) 80 MG tablet Take 80 mg by mouth every morning.  04/25/15   Historical Provider, MD  azithromycin (ZITHROMAX) 250 MG tablet Take first 2 tablets together, then 1 every day until finished. 01/03/16   Tharon Aquas, PA  Cholecalciferol 50000 units capsule Take 50,000 Units by mouth every Thursday.     Historical Provider, MD  cloNIDine (CATAPRES) 0.1 MG tablet Take 1 tablet (0.1 mg total) by mouth 2 (two) times daily. 03/08/16   Blane Ohara, MD  clotrimazole (LOTRIMIN) 1 % cream Apply 1 application topically daily as needed (for rash).    Historical Provider, MD  clotrimazole-betamethasone (LOTRISONE) cream Apply 1 application topically 2 (two) times daily as needed (for rash).  Historical Provider, MD  esomeprazole (NEXIUM) 40 MG capsule Take 40 mg by mouth 2 (two) times daily before a meal.  04/25/15   Historical Provider, MD  fluticasone (CUTIVATE) 0.05 % cream Apply topically 2 (two) times daily. 04/23/15   Linna Hoff, MD  fluticasone (FLONASE) 50 MCG/ACT nasal spray Place 1 spray into both nostrils 2 (two) times daily. 05/13/16   Historical Provider, MD  furosemide (LASIX) 20 MG tablet Take 20 mg by mouth daily.    Historical Provider, MD  glimepiride (AMARYL) 4 MG tablet Take 4 mg by mouth daily with breakfast.  04/25/15    Historical Provider, MD  HYDROcodone-acetaminophen (NORCO/VICODIN) 5-325 MG tablet Take 1 tablet by mouth as directed. 05/22/16   Historical Provider, MD  hydrOXYzine (ATARAX/VISTARIL) 25 MG tablet Take 1 tablet (25 mg total) by mouth every 6 (six) hours. Prn itching 04/23/15   Linna Hoff, MD  insulin detemir (LEVEMIR) 100 UNIT/ML injection Inject 80 Units into the skin 2 (two) times daily.     Historical Provider, MD  LEVEMIR FLEXTOUCH 100 UNIT/ML Pen Inject 80 Units as directed 2 (two) times daily. 05/22/16   Historical Provider, MD  lisinopril-hydrochlorothiazide (PRINZIDE,ZESTORETIC) 20-25 MG tablet Take 1 tablet by mouth daily. 05/13/16   Historical Provider, MD  methocarbamol (ROBAXIN) 500 MG tablet Take 1 tablet by mouth daily. 05/21/16   Historical Provider, MD  nebivolol (BYSTOLIC) 5 MG tablet Take 5 mg by mouth daily.    Historical Provider, MD  ondansetron (ZOFRAN) 4 MG tablet Take 1 tablet (4 mg total) by mouth every 6 (six) hours. 05/04/15   Samantha Tripp Dowless, PA-C  potassium chloride (K-DUR) 10 MEQ tablet Take 10 mEq by mouth daily.     Historical Provider, MD  predniSONE (DELTASONE) 10 MG tablet Sig: 4 tables once a day for 3 days, 3 tablets once a day X3 days, 2 tablets a day for 3 days, 1 tablet a day for 3 days. 01/03/16   Tharon Aquas, PA  TRULICITY 1.5 MG/0.5ML SOPN Inject 1.5 mg as directed once a week. 05/26/16   Historical Provider, MD    Family History Family History  Problem Relation Age of Onset  . Stroke Mother   . Hypertension Father   . Arthritis Father   . Gout Father   . Hypertension Sister   . Diabetes Sister   . Heart attack Sister   . Diabetes Brother   . Stroke Brother     Social History Social History  Substance Use Topics  . Smoking status: Never Smoker  . Smokeless tobacco: Never Used  . Alcohol use No     Allergies   Shrimp [shellfish allergy]; Tetracycline; and Penicillins   Review of Systems Review of Systems  Constitutional:  Negative for fever.  Respiratory: Positive for shortness of breath.   All other systems reviewed and are negative.    Physical Exam Updated Vital Signs BP 175/90 (BP Location: Right Arm)   Pulse 70   Temp 98.2 F (36.8 C) (Oral)   Resp 18   Ht 5\' 3"  (1.6 m)   Wt 131.5 kg   SpO2 98%   BMI 51.37 kg/m   Physical Exam  Constitutional: She is oriented to person, place, and time. She appears well-developed and well-nourished. No distress.  Morbid obesity  HENT:  Head: Normocephalic and atraumatic.  Eyes: Conjunctivae are normal.  Neck: Neck supple.  Cardiovascular: Normal rate, regular rhythm and normal heart sounds.   No murmur heard. Pulmonary/Chest: Effort  normal and breath sounds normal. No respiratory distress. She has no wheezes. She has no rales. She exhibits no tenderness.  Abdominal: Soft. Bowel sounds are normal. She exhibits no distension and no mass. There is no tenderness. There is no rebound and no guarding.  Musculoskeletal: She exhibits no edema (of LEs, no calf tenderness).  Neurological: She is alert and oriented to person, place, and time. She has normal strength. She displays normal reflexes. No cranial nerve deficit or sensory deficit. She exhibits normal muscle tone. Coordination normal.  Skin: Skin is warm and dry.  Psychiatric: She has a normal mood and affect.  Nursing note and vitals reviewed.    ED Treatments / Results  Labs (all labs ordered are listed, but only abnormal results are displayed) Labs Reviewed  BASIC METABOLIC PANEL - Abnormal; Notable for the following:       Result Value   Glucose, Bld 141 (*)    All other components within normal limits  CBC - Abnormal; Notable for the following:    Hemoglobin 11.7 (*)    MCH 25.8 (*)    All other components within normal limits  I-STAT TROPOININ, ED  I-STAT TROPOININ, ED    EKG  EKG Interpretation  Date/Time:  Friday October 17 2016 14:36:39 EST Ventricular Rate:  70 PR  Interval:  210 QRS Duration: 76 QT Interval:  416 QTC Calculation: 449 R Axis:   65 Text Interpretation:  Sinus rhythm with 1st degree A-V block Otherwise normal ECG Confirmed by Rubin Payor  MD, NATHAN [phone]) on 10/17/2016 7:04:21 PM       Radiology Dg Chest 2 View  Result Date: 10/17/2016 CLINICAL DATA:  CHF, high blood pressure, bilateral leg pain and swelling EXAM: CHEST  2 VIEW COMPARISON:  03/08/2016 FINDINGS: There is no focal parenchymal opacity. There is no pleural effusion or pneumothorax. There is mild cardiomegaly. The osseous structures are unremarkable. IMPRESSION: No active cardiopulmonary disease. Electronically Signed   By: Elige Ko   On: 10/17/2016 14:55    Procedures Procedures (including critical care time)  Medications Ordered in ED Medications  sodium chloride 0.9 % bolus 1,000 mL (0 mLs Intravenous Stopped 10/17/16 1904)  metoCLOPramide (REGLAN) injection 10 mg (10 mg Intravenous Given 10/17/16 1812)  diphenhydrAMINE (BENADRYL) injection 25 mg (25 mg Intravenous Given 10/17/16 1815)  lisinopril (PRINIVIL,ZESTRIL) tablet 20 mg (20 mg Oral Given 10/17/16 1928)     Initial Impression / Assessment and Plan / ED Course  I have reviewed the triage vital signs and the nursing notes.  Pertinent labs & imaging results that were available during my care of the patient were reviewed by me and considered in my medical decision making (see chart for details).     Patient is not significantly hypertensive on my arrival, with blood pressure of 140 systolic Headache is not consistent with glaucoma she has no ocular symptoms Her headache did not have a maximum and sudden onset suggestive of a subarachnoid hemorrhage and she has no neck stiffness No fevers or neck stiffness is suggest meningitis No confusion and grossly normal neuro exam Patient given amount green cocktail and feels significantly better  Patient subsequently noted to be significantly hypertensive again Patient  has a long history of difficulty with her blood pressure but good kidney function - less likely seconday source Advised regarding DASH diet, needs to increase activity and loose weight Did give 1 time dose here of blood pressure medication and she will follow-up with her primary care provider Started on  low-dose amlodipine given that she is African-American  Will fu with pcp for anemia as well  EKG wo acute ACS, trop negative x1 (sx long-lasting so one trop adequate)  Strict return precautions given; discharged in good condition  Final Clinical Impressions(s) / ED Diagnoses   Final diagnoses:  Hypertension, unspecified type  Anemia, unspecified type  Other chronic pain  Acute nonintractable headache, unspecified headache type    New Prescriptions Discharge Medication List as of 10/17/2016  7:13 PM       Sidney Ace, MD 10/18/16 4098    Benjiman Core, MD 10/18/16 2235

## 2016-10-17 NOTE — Discharge Instructions (Signed)
Exercise 30 minutes daily Start with walking and then increase to other activities

## 2016-10-19 DIAGNOSIS — G4733 Obstructive sleep apnea (adult) (pediatric): Secondary | ICD-10-CM | POA: Diagnosis not present

## 2016-10-20 DIAGNOSIS — Z6841 Body Mass Index (BMI) 40.0 and over, adult: Secondary | ICD-10-CM | POA: Diagnosis not present

## 2016-10-20 DIAGNOSIS — I10 Essential (primary) hypertension: Secondary | ICD-10-CM | POA: Diagnosis not present

## 2016-10-20 DIAGNOSIS — M5489 Other dorsalgia: Secondary | ICD-10-CM | POA: Diagnosis not present

## 2016-10-20 DIAGNOSIS — M549 Dorsalgia, unspecified: Secondary | ICD-10-CM | POA: Diagnosis not present

## 2016-10-21 DIAGNOSIS — M549 Dorsalgia, unspecified: Secondary | ICD-10-CM | POA: Diagnosis not present

## 2016-10-22 DIAGNOSIS — M549 Dorsalgia, unspecified: Secondary | ICD-10-CM | POA: Diagnosis not present

## 2016-10-23 DIAGNOSIS — M549 Dorsalgia, unspecified: Secondary | ICD-10-CM | POA: Diagnosis not present

## 2016-10-24 ENCOUNTER — Ambulatory Visit (INDEPENDENT_AMBULATORY_CARE_PROVIDER_SITE_OTHER): Payer: Medicare HMO

## 2016-10-24 DIAGNOSIS — I10 Essential (primary) hypertension: Secondary | ICD-10-CM | POA: Diagnosis not present

## 2016-10-24 DIAGNOSIS — M549 Dorsalgia, unspecified: Secondary | ICD-10-CM | POA: Diagnosis not present

## 2016-10-27 DIAGNOSIS — M549 Dorsalgia, unspecified: Secondary | ICD-10-CM | POA: Diagnosis not present

## 2016-10-28 DIAGNOSIS — M549 Dorsalgia, unspecified: Secondary | ICD-10-CM | POA: Diagnosis not present

## 2016-10-29 DIAGNOSIS — M47816 Spondylosis without myelopathy or radiculopathy, lumbar region: Secondary | ICD-10-CM | POA: Diagnosis not present

## 2016-10-29 DIAGNOSIS — M4316 Spondylolisthesis, lumbar region: Secondary | ICD-10-CM | POA: Diagnosis not present

## 2016-10-29 DIAGNOSIS — M549 Dorsalgia, unspecified: Secondary | ICD-10-CM | POA: Diagnosis not present

## 2016-10-30 DIAGNOSIS — M549 Dorsalgia, unspecified: Secondary | ICD-10-CM | POA: Diagnosis not present

## 2016-10-31 DIAGNOSIS — M549 Dorsalgia, unspecified: Secondary | ICD-10-CM | POA: Diagnosis not present

## 2016-11-03 ENCOUNTER — Telehealth: Payer: Self-pay

## 2016-11-03 DIAGNOSIS — Z6841 Body Mass Index (BMI) 40.0 and over, adult: Secondary | ICD-10-CM | POA: Diagnosis not present

## 2016-11-03 DIAGNOSIS — I509 Heart failure, unspecified: Secondary | ICD-10-CM | POA: Diagnosis not present

## 2016-11-03 DIAGNOSIS — M549 Dorsalgia, unspecified: Secondary | ICD-10-CM | POA: Diagnosis not present

## 2016-11-03 DIAGNOSIS — I10 Essential (primary) hypertension: Secondary | ICD-10-CM | POA: Diagnosis not present

## 2016-11-03 NOTE — Telephone Encounter (Signed)
Ambulatory Blood Pressure Monitor showed patient's average BP was 160/79 and average HR 63.  Per Dr. Mayford Knife, patient is to STOP PRINZIDE and START LISINOPRIL 40 mg and HCTZ 25 mg daily and schedule in the HTN Clinic in a week. Patient states about 2 weeks ago after she was seen in the ED for HTN, her PCP increased her amlodipine to 5 mg daily and her Bystolic to 10 mg daily. She checks her BP at home and the reading was 140s/70s today. Scheduled patient in the HTN Clinic 3/26 for recheck. She will bring her medications and her BP cuff to compare. She was grateful for call and agrees with treatment plan.   Med list updated.

## 2016-11-04 DIAGNOSIS — M549 Dorsalgia, unspecified: Secondary | ICD-10-CM | POA: Diagnosis not present

## 2016-11-05 DIAGNOSIS — M549 Dorsalgia, unspecified: Secondary | ICD-10-CM | POA: Diagnosis not present

## 2016-11-06 DIAGNOSIS — M549 Dorsalgia, unspecified: Secondary | ICD-10-CM | POA: Diagnosis not present

## 2016-11-07 DIAGNOSIS — M549 Dorsalgia, unspecified: Secondary | ICD-10-CM | POA: Diagnosis not present

## 2016-11-10 ENCOUNTER — Ambulatory Visit: Payer: Medicare HMO

## 2016-11-10 DIAGNOSIS — M549 Dorsalgia, unspecified: Secondary | ICD-10-CM | POA: Diagnosis not present

## 2016-11-10 NOTE — Progress Notes (Deleted)
Patient ID: Katelyn Howard                 DOB: [birth year]                      MRN: [id number]     HPI: Katelyn Howard is a 56 y.o. female patient of Dr. Mayford Knife with PMH below who presents today for hypertension evaluation. PMH includes asthma, HTN, obesity, OSA, HLD, and DM. She was admitted to the ED 3 weeks ago with BP 175/90 and headache. Amlodipine 2.5 mg was started.  At last appointment 1 month ago, Dr. Mayford Knife ordered 24 hour BP monitor. Results showed average BP 160/79 and HR 63.   Per patient, her PCP  increased amlodipine to 5 mg and Bystolic to 10 mg ~3 weeks ago.  Patient presents today   Swelling, dizziness, headache,   Cardiac Hx: HTN, HLD  Current HTN meds:  Lisinopril 40 mg HCTZ 25 mg Amlodipine 5 mg Bystolic 10 mg  Previously tried:   BP goal: <130/80  Family History: Father- HTN; Mother- stroke; Sister- HTN, MI; Brother- stroke  Social History: No tobacco,  Diet:   Exercise:   Home BP readings:   Wt Readings from Last 3 Encounters:  10/17/16 290 lb (131.5 kg)  10/13/16 289 lb 12.8 oz (131.5 kg)  07/21/16 286 lb (129.7 kg)   BP Readings from Last 3 Encounters:  10/17/16 198/100  10/13/16 (!) 158/80  06/04/16 (!) 142/74   Pulse Readings from Last 3 Encounters:  10/17/16 69  10/13/16 67  06/04/16 64    Renal function: CrCl cannot be calculated (Patient's most recent lab result is older than the maximum 21 days allowed.).  Past Medical History:  Diagnosis Date  . Arthritis   . Asthma   . Diabetes mellitus   . Edema extremities   . GERD (gastroesophageal reflux disease)   . Hyperlipidemia   . Hypertension   . Obesity   . OSA (obstructive sleep apnea) 10/13/2016   Severe with AHI 44.9/hr now on CPAP at 11cm H2O    Current Outpatient Prescriptions on File Prior to Visit  Medication Sig Dispense Refill  . acetaminophen (TYLENOL) 500 MG tablet Take 1 tablet (500 mg total) by mouth every 6 (six) hours as needed. 30 tablet 0  .  albuterol (PROVENTIL HFA;VENTOLIN HFA) 108 (90 BASE) MCG/ACT inhaler Inhale 2 puffs into the lungs every 6 (six) hours as needed. For wheezing    . amLODipine (NORVASC) 5 MG tablet Take 5 mg by mouth daily.    Marland Kitchen aspirin EC 81 MG tablet Take 81 mg by mouth daily.    Marland Kitchen atorvastatin (LIPITOR) 80 MG tablet Take 80 mg by mouth every morning.     Marland Kitchen azithromycin (ZITHROMAX) 250 MG tablet Take first 2 tablets together, then 1 every day until finished. 6 tablet 0  . Cholecalciferol 50000 units capsule Take 50,000 Units by mouth every Thursday.     . cloNIDine (CATAPRES) 0.1 MG tablet Take 1 tablet (0.1 mg total) by mouth 2 (two) times daily. 60 tablet 11  . clotrimazole (LOTRIMIN) 1 % cream Apply 1 application topically daily as needed (for rash).    . clotrimazole-betamethasone (LOTRISONE) cream Apply 1 application topically 2 (two) times daily as needed (for rash).    Marland Kitchen esomeprazole (NEXIUM) 40 MG capsule Take 40 mg by mouth 2 (two) times daily before a meal.     . fluticasone (CUTIVATE) 0.05 % cream Apply topically  2 (two) times daily. 30 g 1  . fluticasone (FLONASE) 50 MCG/ACT nasal spray Place 1 spray into both nostrils 2 (two) times daily.    . furosemide (LASIX) 20 MG tablet Take 20 mg by mouth daily.    Marland Kitchen glimepiride (AMARYL) 4 MG tablet Take 4 mg by mouth daily with breakfast.     . HYDROcodone-acetaminophen (NORCO/VICODIN) 5-325 MG tablet Take 1 tablet by mouth as directed.    . hydrOXYzine (ATARAX/VISTARIL) 25 MG tablet Take 1 tablet (25 mg total) by mouth every 6 (six) hours. Prn itching 20 tablet 1  . insulin detemir (LEVEMIR) 100 UNIT/ML injection Inject 80 Units into the skin 2 (two) times daily.     Marland Kitchen LEVEMIR FLEXTOUCH 100 UNIT/ML Pen Inject 80 Units as directed 2 (two) times daily.    Marland Kitchen lisinopril-hydrochlorothiazide (PRINZIDE,ZESTORETIC) 20-25 MG tablet Take 1 tablet by mouth daily.    . methocarbamol (ROBAXIN) 500 MG tablet Take 1 tablet by mouth daily.    . nebivolol (BYSTOLIC) 10 MG  tablet Take 10 mg by mouth daily.    . ondansetron (ZOFRAN) 4 MG tablet Take 1 tablet (4 mg total) by mouth every 6 (six) hours. 12 tablet 0  . potassium chloride (K-DUR) 10 MEQ tablet Take 10 mEq by mouth daily.     . predniSONE (DELTASONE) 10 MG tablet Sig: 4 tables once a day for 3 days, 3 tablets once a day X3 days, 2 tablets a day for 3 days, 1 tablet a day for 3 days. 15 tablet 0  . TRULICITY 1.5 MG/0.5ML SOPN Inject 1.5 mg as directed once a week.     No current facility-administered medications on file prior to visit.     Allergies  Allergen Reactions  . Shrimp [Shellfish Allergy] Anaphylaxis  . Tetracycline Nausea And Vomiting  . Penicillins Itching    There were no vitals taken for this visit.   Assessment/Plan: Hypertension: BMET w/ lisinopril increase; increase amlodipine if no swelling, bystolic if swelling   Thank you, Freddie Apley. Cleatis Polka, PharmD  [facility]  11/10/2016 8:52 AM

## 2016-11-11 DIAGNOSIS — M549 Dorsalgia, unspecified: Secondary | ICD-10-CM | POA: Diagnosis not present

## 2016-11-12 DIAGNOSIS — M549 Dorsalgia, unspecified: Secondary | ICD-10-CM | POA: Diagnosis not present

## 2016-11-13 DIAGNOSIS — M549 Dorsalgia, unspecified: Secondary | ICD-10-CM | POA: Diagnosis not present

## 2016-11-17 DIAGNOSIS — M549 Dorsalgia, unspecified: Secondary | ICD-10-CM | POA: Diagnosis not present

## 2016-11-18 DIAGNOSIS — M549 Dorsalgia, unspecified: Secondary | ICD-10-CM | POA: Diagnosis not present

## 2016-11-19 DIAGNOSIS — M549 Dorsalgia, unspecified: Secondary | ICD-10-CM | POA: Diagnosis not present

## 2016-11-19 DIAGNOSIS — G4733 Obstructive sleep apnea (adult) (pediatric): Secondary | ICD-10-CM | POA: Diagnosis not present

## 2016-11-20 ENCOUNTER — Telehealth: Payer: Self-pay | Admitting: *Deleted

## 2016-11-20 DIAGNOSIS — M549 Dorsalgia, unspecified: Secondary | ICD-10-CM | POA: Diagnosis not present

## 2016-11-20 NOTE — Telephone Encounter (Signed)
Called the patient and gave her the sleep study results, she verbalized understanding and said thank you

## 2016-11-20 NOTE — Telephone Encounter (Signed)
-----   Message from Quintella Reichert, MD sent at 11/20/2016  2:21 PM EDT ----- Good AHI and compliance.  Continue current CPAP settings.

## 2016-11-21 DIAGNOSIS — M549 Dorsalgia, unspecified: Secondary | ICD-10-CM | POA: Diagnosis not present

## 2016-11-24 DIAGNOSIS — M549 Dorsalgia, unspecified: Secondary | ICD-10-CM | POA: Diagnosis not present

## 2016-11-25 DIAGNOSIS — M549 Dorsalgia, unspecified: Secondary | ICD-10-CM | POA: Diagnosis not present

## 2016-11-26 DIAGNOSIS — M549 Dorsalgia, unspecified: Secondary | ICD-10-CM | POA: Diagnosis not present

## 2016-11-27 DIAGNOSIS — M549 Dorsalgia, unspecified: Secondary | ICD-10-CM | POA: Diagnosis not present

## 2016-11-28 DIAGNOSIS — M549 Dorsalgia, unspecified: Secondary | ICD-10-CM | POA: Diagnosis not present

## 2016-12-01 DIAGNOSIS — M549 Dorsalgia, unspecified: Secondary | ICD-10-CM | POA: Diagnosis not present

## 2016-12-02 DIAGNOSIS — M5442 Lumbago with sciatica, left side: Secondary | ICD-10-CM | POA: Diagnosis not present

## 2016-12-02 DIAGNOSIS — M4316 Spondylolisthesis, lumbar region: Secondary | ICD-10-CM | POA: Diagnosis not present

## 2016-12-02 DIAGNOSIS — M47816 Spondylosis without myelopathy or radiculopathy, lumbar region: Secondary | ICD-10-CM | POA: Diagnosis not present

## 2016-12-02 DIAGNOSIS — M549 Dorsalgia, unspecified: Secondary | ICD-10-CM | POA: Diagnosis not present

## 2016-12-02 DIAGNOSIS — M5441 Lumbago with sciatica, right side: Secondary | ICD-10-CM | POA: Diagnosis not present

## 2016-12-03 DIAGNOSIS — M549 Dorsalgia, unspecified: Secondary | ICD-10-CM | POA: Diagnosis not present

## 2016-12-04 DIAGNOSIS — M549 Dorsalgia, unspecified: Secondary | ICD-10-CM | POA: Diagnosis not present

## 2016-12-05 DIAGNOSIS — M549 Dorsalgia, unspecified: Secondary | ICD-10-CM | POA: Diagnosis not present

## 2016-12-08 DIAGNOSIS — M549 Dorsalgia, unspecified: Secondary | ICD-10-CM | POA: Diagnosis not present

## 2016-12-09 DIAGNOSIS — M549 Dorsalgia, unspecified: Secondary | ICD-10-CM | POA: Diagnosis not present

## 2016-12-10 DIAGNOSIS — M549 Dorsalgia, unspecified: Secondary | ICD-10-CM | POA: Diagnosis not present

## 2016-12-11 DIAGNOSIS — M549 Dorsalgia, unspecified: Secondary | ICD-10-CM | POA: Diagnosis not present

## 2016-12-12 DIAGNOSIS — M549 Dorsalgia, unspecified: Secondary | ICD-10-CM | POA: Diagnosis not present

## 2016-12-15 DIAGNOSIS — M549 Dorsalgia, unspecified: Secondary | ICD-10-CM | POA: Diagnosis not present

## 2016-12-16 DIAGNOSIS — M549 Dorsalgia, unspecified: Secondary | ICD-10-CM | POA: Diagnosis not present

## 2016-12-17 DIAGNOSIS — M549 Dorsalgia, unspecified: Secondary | ICD-10-CM | POA: Diagnosis not present

## 2016-12-18 DIAGNOSIS — M549 Dorsalgia, unspecified: Secondary | ICD-10-CM | POA: Diagnosis not present

## 2016-12-19 DIAGNOSIS — M549 Dorsalgia, unspecified: Secondary | ICD-10-CM | POA: Diagnosis not present

## 2016-12-19 DIAGNOSIS — G4733 Obstructive sleep apnea (adult) (pediatric): Secondary | ICD-10-CM | POA: Diagnosis not present

## 2016-12-22 DIAGNOSIS — M549 Dorsalgia, unspecified: Secondary | ICD-10-CM | POA: Diagnosis not present

## 2016-12-23 DIAGNOSIS — M549 Dorsalgia, unspecified: Secondary | ICD-10-CM | POA: Diagnosis not present

## 2016-12-24 DIAGNOSIS — M549 Dorsalgia, unspecified: Secondary | ICD-10-CM | POA: Diagnosis not present

## 2016-12-25 DIAGNOSIS — M549 Dorsalgia, unspecified: Secondary | ICD-10-CM | POA: Diagnosis not present

## 2016-12-26 DIAGNOSIS — M549 Dorsalgia, unspecified: Secondary | ICD-10-CM | POA: Diagnosis not present

## 2016-12-29 DIAGNOSIS — E119 Type 2 diabetes mellitus without complications: Secondary | ICD-10-CM | POA: Diagnosis not present

## 2016-12-29 DIAGNOSIS — Z6841 Body Mass Index (BMI) 40.0 and over, adult: Secondary | ICD-10-CM | POA: Diagnosis not present

## 2016-12-29 DIAGNOSIS — G4733 Obstructive sleep apnea (adult) (pediatric): Secondary | ICD-10-CM | POA: Diagnosis not present

## 2016-12-29 DIAGNOSIS — J45998 Other asthma: Secondary | ICD-10-CM | POA: Diagnosis not present

## 2016-12-29 DIAGNOSIS — I10 Essential (primary) hypertension: Secondary | ICD-10-CM | POA: Diagnosis not present

## 2016-12-29 DIAGNOSIS — M549 Dorsalgia, unspecified: Secondary | ICD-10-CM | POA: Diagnosis not present

## 2016-12-29 DIAGNOSIS — I509 Heart failure, unspecified: Secondary | ICD-10-CM | POA: Diagnosis not present

## 2016-12-30 DIAGNOSIS — M549 Dorsalgia, unspecified: Secondary | ICD-10-CM | POA: Diagnosis not present

## 2016-12-31 DIAGNOSIS — M549 Dorsalgia, unspecified: Secondary | ICD-10-CM | POA: Diagnosis not present

## 2017-01-01 DIAGNOSIS — M549 Dorsalgia, unspecified: Secondary | ICD-10-CM | POA: Diagnosis not present

## 2017-01-02 DIAGNOSIS — M549 Dorsalgia, unspecified: Secondary | ICD-10-CM | POA: Diagnosis not present

## 2017-01-05 DIAGNOSIS — M549 Dorsalgia, unspecified: Secondary | ICD-10-CM | POA: Diagnosis not present

## 2017-01-06 DIAGNOSIS — M549 Dorsalgia, unspecified: Secondary | ICD-10-CM | POA: Diagnosis not present

## 2017-01-07 DIAGNOSIS — M549 Dorsalgia, unspecified: Secondary | ICD-10-CM | POA: Diagnosis not present

## 2017-01-08 DIAGNOSIS — M549 Dorsalgia, unspecified: Secondary | ICD-10-CM | POA: Diagnosis not present

## 2017-01-09 DIAGNOSIS — M549 Dorsalgia, unspecified: Secondary | ICD-10-CM | POA: Diagnosis not present

## 2017-01-13 DIAGNOSIS — M549 Dorsalgia, unspecified: Secondary | ICD-10-CM | POA: Diagnosis not present

## 2017-01-14 DIAGNOSIS — M549 Dorsalgia, unspecified: Secondary | ICD-10-CM | POA: Diagnosis not present

## 2017-01-15 DIAGNOSIS — M549 Dorsalgia, unspecified: Secondary | ICD-10-CM | POA: Diagnosis not present

## 2017-01-16 DIAGNOSIS — M549 Dorsalgia, unspecified: Secondary | ICD-10-CM | POA: Diagnosis not present

## 2017-01-19 DIAGNOSIS — M549 Dorsalgia, unspecified: Secondary | ICD-10-CM | POA: Diagnosis not present

## 2017-01-19 DIAGNOSIS — G4733 Obstructive sleep apnea (adult) (pediatric): Secondary | ICD-10-CM | POA: Diagnosis not present

## 2017-01-20 DIAGNOSIS — M549 Dorsalgia, unspecified: Secondary | ICD-10-CM | POA: Diagnosis not present

## 2017-01-21 DIAGNOSIS — M549 Dorsalgia, unspecified: Secondary | ICD-10-CM | POA: Diagnosis not present

## 2017-01-22 DIAGNOSIS — M549 Dorsalgia, unspecified: Secondary | ICD-10-CM | POA: Diagnosis not present

## 2017-01-23 DIAGNOSIS — M549 Dorsalgia, unspecified: Secondary | ICD-10-CM | POA: Diagnosis not present

## 2017-01-26 DIAGNOSIS — M549 Dorsalgia, unspecified: Secondary | ICD-10-CM | POA: Diagnosis not present

## 2017-01-27 DIAGNOSIS — M549 Dorsalgia, unspecified: Secondary | ICD-10-CM | POA: Diagnosis not present

## 2017-01-28 DIAGNOSIS — M549 Dorsalgia, unspecified: Secondary | ICD-10-CM | POA: Diagnosis not present

## 2017-01-28 DIAGNOSIS — G4733 Obstructive sleep apnea (adult) (pediatric): Secondary | ICD-10-CM | POA: Diagnosis not present

## 2017-01-29 DIAGNOSIS — M549 Dorsalgia, unspecified: Secondary | ICD-10-CM | POA: Diagnosis not present

## 2017-01-30 DIAGNOSIS — M549 Dorsalgia, unspecified: Secondary | ICD-10-CM | POA: Diagnosis not present

## 2017-02-02 DIAGNOSIS — M549 Dorsalgia, unspecified: Secondary | ICD-10-CM | POA: Diagnosis not present

## 2017-02-05 DIAGNOSIS — M549 Dorsalgia, unspecified: Secondary | ICD-10-CM | POA: Diagnosis not present

## 2017-02-06 DIAGNOSIS — M549 Dorsalgia, unspecified: Secondary | ICD-10-CM | POA: Diagnosis not present

## 2017-02-09 DIAGNOSIS — M549 Dorsalgia, unspecified: Secondary | ICD-10-CM | POA: Diagnosis not present

## 2017-02-10 DIAGNOSIS — M549 Dorsalgia, unspecified: Secondary | ICD-10-CM | POA: Diagnosis not present

## 2017-02-11 DIAGNOSIS — M549 Dorsalgia, unspecified: Secondary | ICD-10-CM | POA: Diagnosis not present

## 2017-02-12 DIAGNOSIS — M549 Dorsalgia, unspecified: Secondary | ICD-10-CM | POA: Diagnosis not present

## 2017-02-13 DIAGNOSIS — M549 Dorsalgia, unspecified: Secondary | ICD-10-CM | POA: Diagnosis not present

## 2017-02-16 DIAGNOSIS — M549 Dorsalgia, unspecified: Secondary | ICD-10-CM | POA: Diagnosis not present

## 2017-02-17 DIAGNOSIS — M549 Dorsalgia, unspecified: Secondary | ICD-10-CM | POA: Diagnosis not present

## 2017-02-18 DIAGNOSIS — G4733 Obstructive sleep apnea (adult) (pediatric): Secondary | ICD-10-CM | POA: Diagnosis not present

## 2017-02-19 DIAGNOSIS — M549 Dorsalgia, unspecified: Secondary | ICD-10-CM | POA: Diagnosis not present

## 2017-02-20 DIAGNOSIS — M549 Dorsalgia, unspecified: Secondary | ICD-10-CM | POA: Diagnosis not present

## 2017-02-23 DIAGNOSIS — M549 Dorsalgia, unspecified: Secondary | ICD-10-CM | POA: Diagnosis not present

## 2017-02-24 DIAGNOSIS — M549 Dorsalgia, unspecified: Secondary | ICD-10-CM | POA: Diagnosis not present

## 2017-02-25 DIAGNOSIS — M549 Dorsalgia, unspecified: Secondary | ICD-10-CM | POA: Diagnosis not present

## 2017-02-26 DIAGNOSIS — M549 Dorsalgia, unspecified: Secondary | ICD-10-CM | POA: Diagnosis not present

## 2017-02-27 DIAGNOSIS — M549 Dorsalgia, unspecified: Secondary | ICD-10-CM | POA: Diagnosis not present

## 2017-03-02 DIAGNOSIS — M549 Dorsalgia, unspecified: Secondary | ICD-10-CM | POA: Diagnosis not present

## 2017-03-03 DIAGNOSIS — M549 Dorsalgia, unspecified: Secondary | ICD-10-CM | POA: Diagnosis not present

## 2017-03-04 DIAGNOSIS — M549 Dorsalgia, unspecified: Secondary | ICD-10-CM | POA: Diagnosis not present

## 2017-03-05 DIAGNOSIS — M549 Dorsalgia, unspecified: Secondary | ICD-10-CM | POA: Diagnosis not present

## 2017-03-06 DIAGNOSIS — M549 Dorsalgia, unspecified: Secondary | ICD-10-CM | POA: Diagnosis not present

## 2017-03-09 DIAGNOSIS — M549 Dorsalgia, unspecified: Secondary | ICD-10-CM | POA: Diagnosis not present

## 2017-03-10 DIAGNOSIS — M549 Dorsalgia, unspecified: Secondary | ICD-10-CM | POA: Diagnosis not present

## 2017-03-11 DIAGNOSIS — M549 Dorsalgia, unspecified: Secondary | ICD-10-CM | POA: Diagnosis not present

## 2017-03-12 DIAGNOSIS — M549 Dorsalgia, unspecified: Secondary | ICD-10-CM | POA: Diagnosis not present

## 2017-03-13 DIAGNOSIS — M549 Dorsalgia, unspecified: Secondary | ICD-10-CM | POA: Diagnosis not present

## 2017-03-16 DIAGNOSIS — M549 Dorsalgia, unspecified: Secondary | ICD-10-CM | POA: Diagnosis not present

## 2017-03-17 DIAGNOSIS — M549 Dorsalgia, unspecified: Secondary | ICD-10-CM | POA: Diagnosis not present

## 2017-03-18 DIAGNOSIS — M549 Dorsalgia, unspecified: Secondary | ICD-10-CM | POA: Diagnosis not present

## 2017-03-19 DIAGNOSIS — M549 Dorsalgia, unspecified: Secondary | ICD-10-CM | POA: Diagnosis not present

## 2017-03-20 DIAGNOSIS — M549 Dorsalgia, unspecified: Secondary | ICD-10-CM | POA: Diagnosis not present

## 2017-03-21 DIAGNOSIS — G4733 Obstructive sleep apnea (adult) (pediatric): Secondary | ICD-10-CM | POA: Diagnosis not present

## 2017-03-23 DIAGNOSIS — M549 Dorsalgia, unspecified: Secondary | ICD-10-CM | POA: Diagnosis not present

## 2017-03-24 DIAGNOSIS — M549 Dorsalgia, unspecified: Secondary | ICD-10-CM | POA: Diagnosis not present

## 2017-03-25 DIAGNOSIS — M549 Dorsalgia, unspecified: Secondary | ICD-10-CM | POA: Diagnosis not present

## 2017-03-26 DIAGNOSIS — M549 Dorsalgia, unspecified: Secondary | ICD-10-CM | POA: Diagnosis not present

## 2017-03-27 DIAGNOSIS — M549 Dorsalgia, unspecified: Secondary | ICD-10-CM | POA: Diagnosis not present

## 2017-03-30 DIAGNOSIS — M549 Dorsalgia, unspecified: Secondary | ICD-10-CM | POA: Diagnosis not present

## 2017-03-31 DIAGNOSIS — M549 Dorsalgia, unspecified: Secondary | ICD-10-CM | POA: Diagnosis not present

## 2017-04-01 DIAGNOSIS — M549 Dorsalgia, unspecified: Secondary | ICD-10-CM | POA: Diagnosis not present

## 2017-04-01 IMAGING — CT CT ABD-PELV W/ CM
2 of 5 series · 10 of 46 positions shown, 11 images · IV contrast (Iodine)
Comparison: Lower chest: Minimal linear atelectasis in the lingula.

CLINICAL DATA: Right lower quadrant abdominal pain with nausea and
vomiting since yesterday.

EXAM:
CT ABDOMEN AND PELVIS WITH CONTRAST
TECHNIQUE: Multidetector CT imaging of the abdomen and pelvis was performed
using the standard protocol following bolus administration of
intravenous contrast.
CONTRAST:  100mL OMNIPAQUE IOHEXOL 300 MG/ML  SOLN

[Series 201: routine, idose (2) · axial · 0.79mm/px · z∈[-570,-185]mm · 7 of 99 slices shown, 8 images]
[im 11/99  soft-tissue]
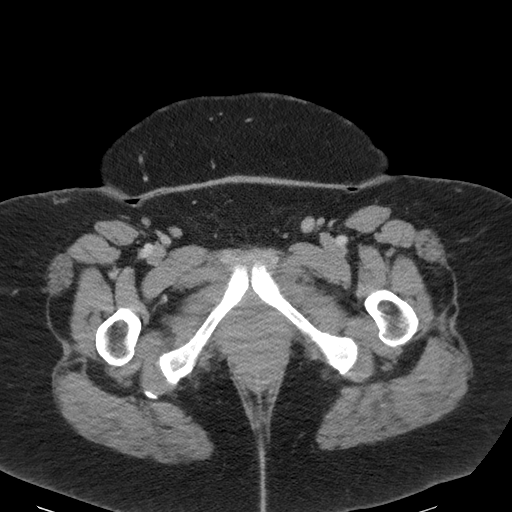
[im 11/99  bone]
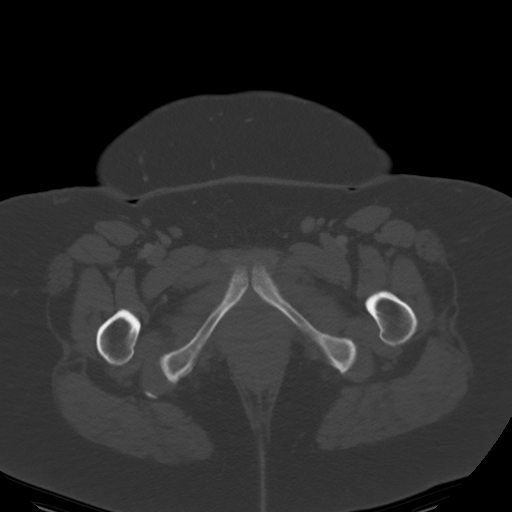
[im 22/99  soft-tissue]
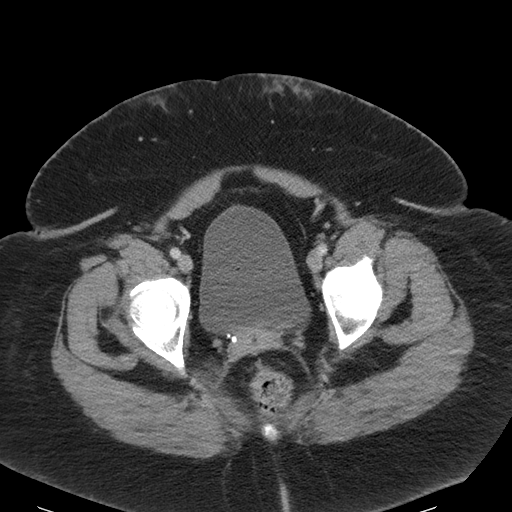
[im 39/99  soft-tissue]
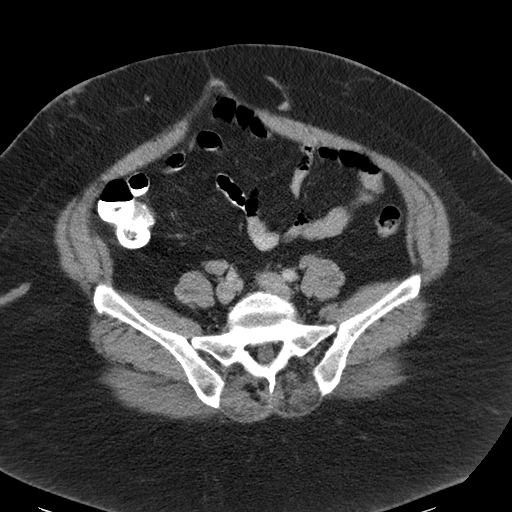
[im 50/99  soft-tissue]
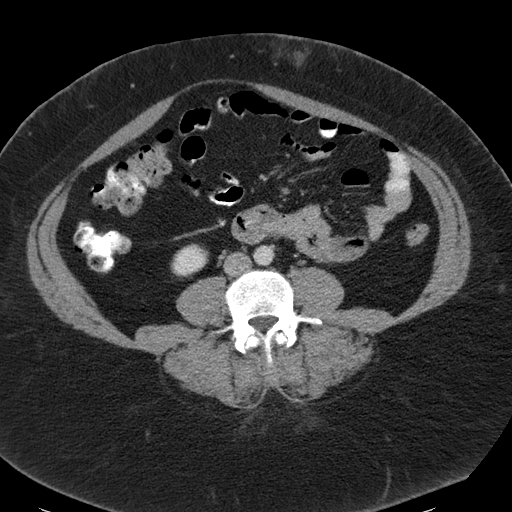
[im 60/99  soft-tissue]
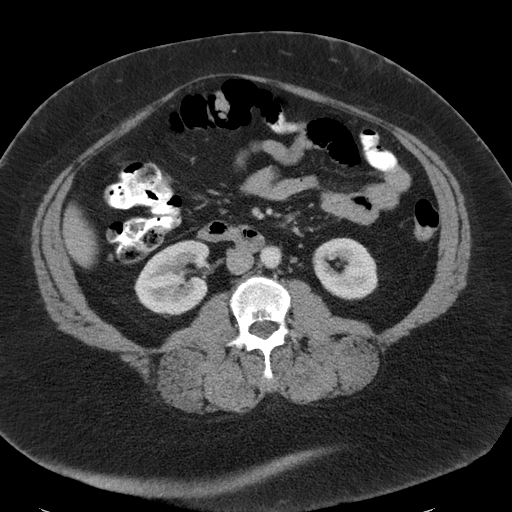
[im 77/99  soft-tissue]
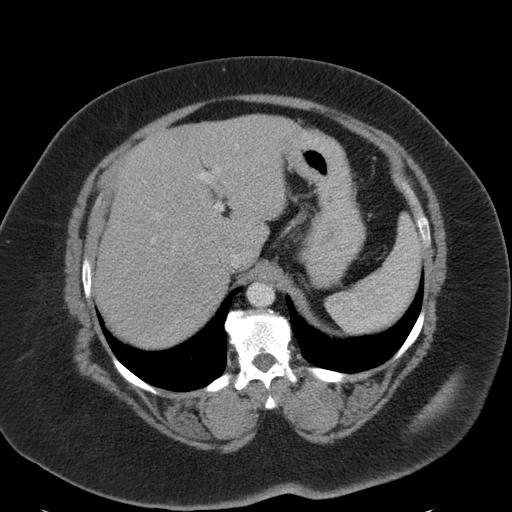
[im 88/99  soft-tissue]
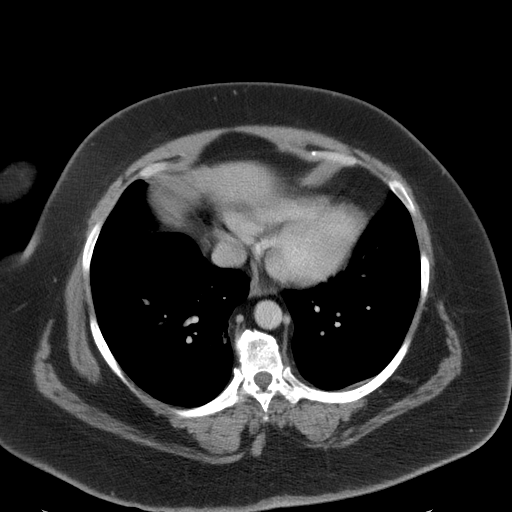

[Series 203: coronals, idose (2) · coronal · 0.45mm/px · 3 of 161 slices shown]
[im 54/161  soft-tissue]
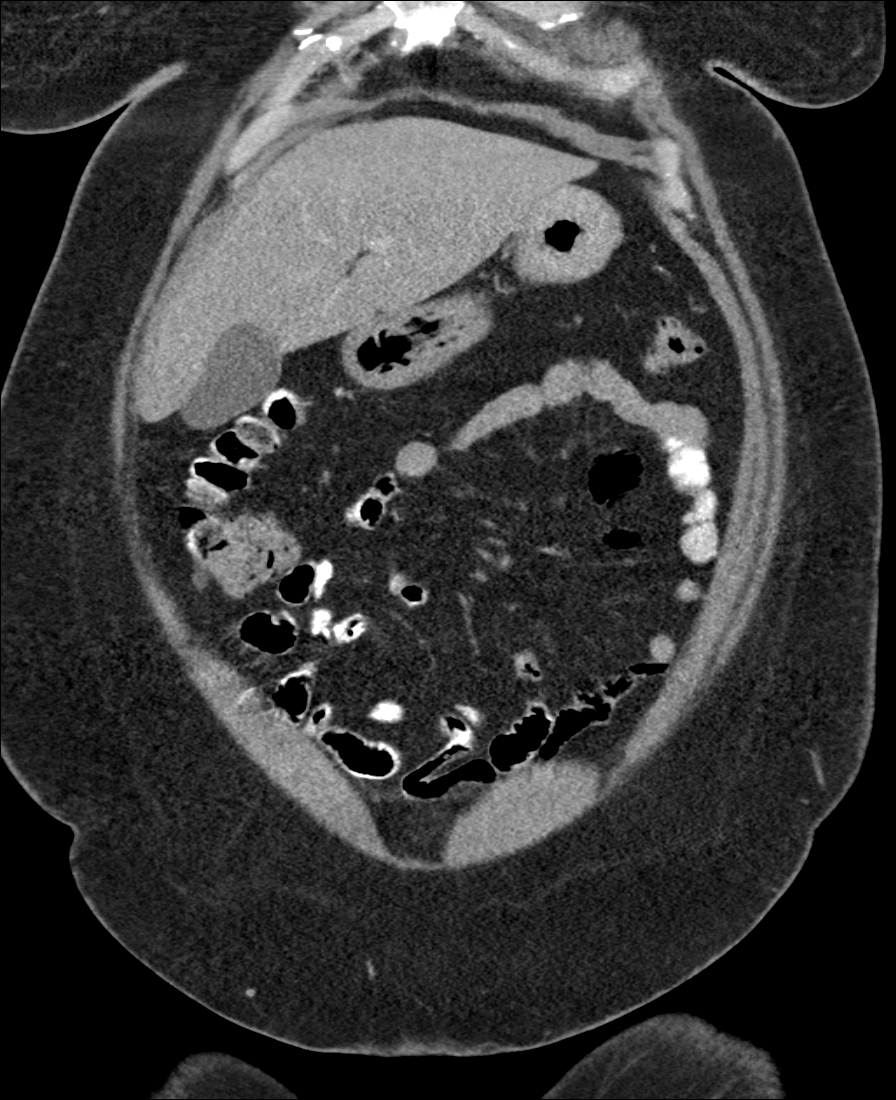
[im 72/161  soft-tissue]
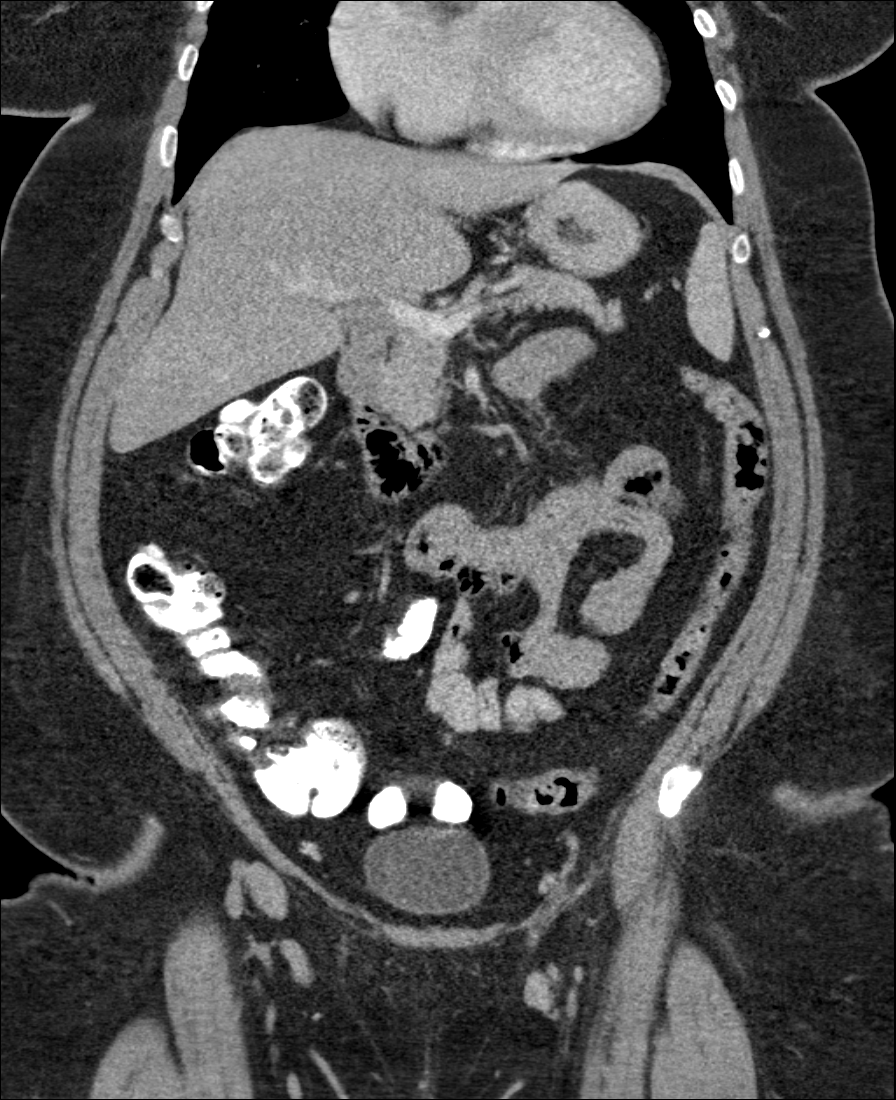
[im 89/161  soft-tissue]
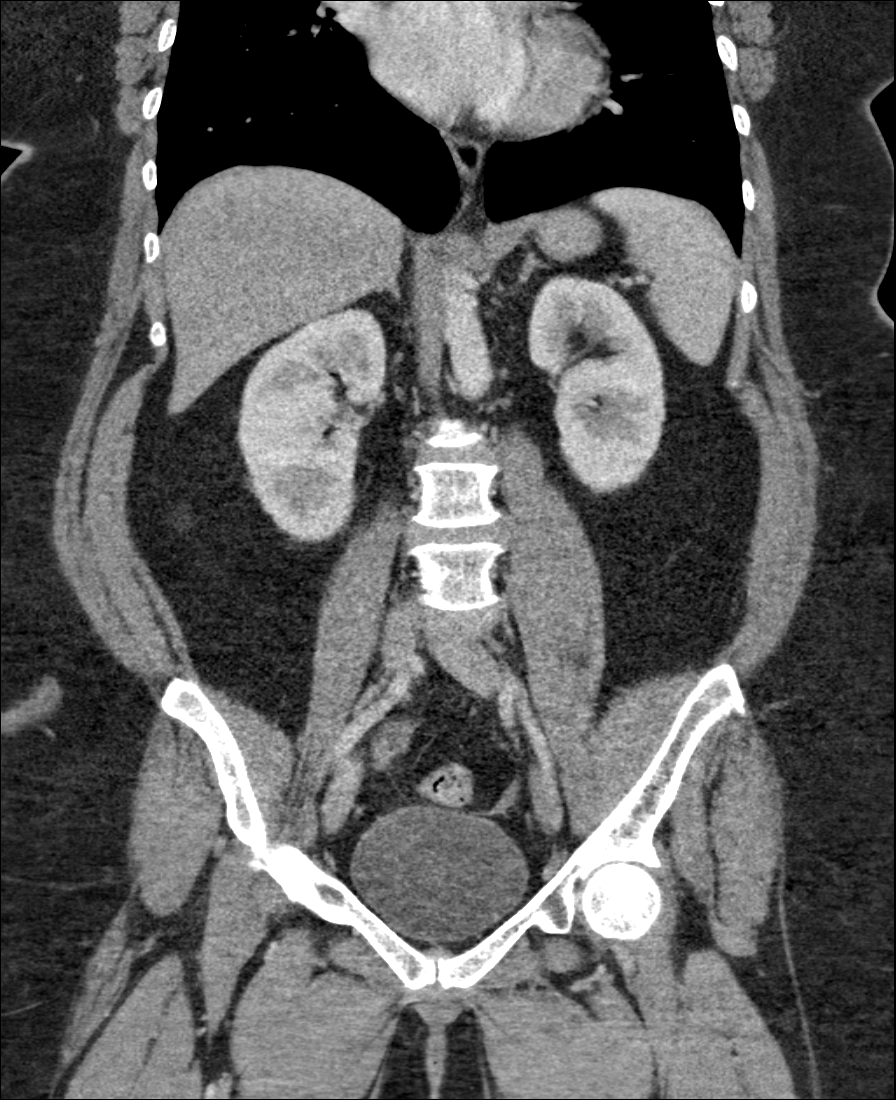

[10 of 46 positions shown; findings below may reference images not displayed]

Lung bases are otherwise clear.

Liver: Prominent in size with probable steatosis.  No focal lesion.

Hepatobiliary: Gallbladder physiologically distended, no calcified
stone. No biliary dilatation.

Pancreas: Normal.

Spleen: Normal.

Adrenal glands: No nodule.

Kidneys: Symmetric renal enhancement and excretion. No
hydronephrosis. No localizing renal abnormality.

Stomach/Bowel: Small hiatal hernia. Stomach physiologically
distended. There are no dilated or thickened small bowel loops.
Small volume of stool throughout the colon without colonic wall
thickening. The appendix is normal.

Vascular/Lymphatic: No retroperitoneal adenopathy. Abdominal aorta
is normal in caliber.

Reproductive: The uterus is surgically absent. Both ovaries are
tentatively identified, quiescent normal for age. No adnexal mass.

Bladder: Physiologically distended.

Other: No free air, free fluid, or intra-abdominal fluid collection.
There is mild soft tissue stranding of the anterior abdominal wall.

Musculoskeletal: There are no acute or suspicious osseous
abnormalities. Multilevel degenerative change throughout the spine
with primarily facet arthropathy. Degenerative change of the
sacroiliac joints.
FINDINGS: No acute abnormality of the abdomen/pelvis. Particularly, normal
appendix.

## 2017-04-03 DIAGNOSIS — M549 Dorsalgia, unspecified: Secondary | ICD-10-CM | POA: Diagnosis not present

## 2017-04-06 DIAGNOSIS — M549 Dorsalgia, unspecified: Secondary | ICD-10-CM | POA: Diagnosis not present

## 2017-04-07 DIAGNOSIS — M549 Dorsalgia, unspecified: Secondary | ICD-10-CM | POA: Diagnosis not present

## 2017-04-08 DIAGNOSIS — M549 Dorsalgia, unspecified: Secondary | ICD-10-CM | POA: Diagnosis not present

## 2017-04-09 DIAGNOSIS — M549 Dorsalgia, unspecified: Secondary | ICD-10-CM | POA: Diagnosis not present

## 2017-04-21 DIAGNOSIS — G4733 Obstructive sleep apnea (adult) (pediatric): Secondary | ICD-10-CM | POA: Diagnosis not present

## 2017-04-24 DIAGNOSIS — M549 Dorsalgia, unspecified: Secondary | ICD-10-CM | POA: Diagnosis not present

## 2017-04-24 DIAGNOSIS — G609 Hereditary and idiopathic neuropathy, unspecified: Secondary | ICD-10-CM | POA: Diagnosis not present

## 2017-04-24 DIAGNOSIS — I509 Heart failure, unspecified: Secondary | ICD-10-CM | POA: Diagnosis not present

## 2017-05-07 DIAGNOSIS — G4733 Obstructive sleep apnea (adult) (pediatric): Secondary | ICD-10-CM | POA: Diagnosis not present

## 2017-05-21 DIAGNOSIS — G4733 Obstructive sleep apnea (adult) (pediatric): Secondary | ICD-10-CM | POA: Diagnosis not present

## 2017-05-28 DIAGNOSIS — M549 Dorsalgia, unspecified: Secondary | ICD-10-CM | POA: Diagnosis not present

## 2017-05-29 DIAGNOSIS — M549 Dorsalgia, unspecified: Secondary | ICD-10-CM | POA: Diagnosis not present

## 2017-06-01 DIAGNOSIS — M549 Dorsalgia, unspecified: Secondary | ICD-10-CM | POA: Diagnosis not present

## 2017-06-02 DIAGNOSIS — M549 Dorsalgia, unspecified: Secondary | ICD-10-CM | POA: Diagnosis not present

## 2017-06-03 DIAGNOSIS — M549 Dorsalgia, unspecified: Secondary | ICD-10-CM | POA: Diagnosis not present

## 2017-06-04 DIAGNOSIS — M549 Dorsalgia, unspecified: Secondary | ICD-10-CM | POA: Diagnosis not present

## 2017-06-05 DIAGNOSIS — M549 Dorsalgia, unspecified: Secondary | ICD-10-CM | POA: Diagnosis not present

## 2017-06-08 DIAGNOSIS — M549 Dorsalgia, unspecified: Secondary | ICD-10-CM | POA: Diagnosis not present

## 2017-06-09 DIAGNOSIS — M549 Dorsalgia, unspecified: Secondary | ICD-10-CM | POA: Diagnosis not present

## 2017-06-10 DIAGNOSIS — M549 Dorsalgia, unspecified: Secondary | ICD-10-CM | POA: Diagnosis not present

## 2017-06-11 DIAGNOSIS — M549 Dorsalgia, unspecified: Secondary | ICD-10-CM | POA: Diagnosis not present

## 2017-06-12 DIAGNOSIS — M549 Dorsalgia, unspecified: Secondary | ICD-10-CM | POA: Diagnosis not present

## 2017-06-15 DIAGNOSIS — M549 Dorsalgia, unspecified: Secondary | ICD-10-CM | POA: Diagnosis not present

## 2017-06-16 DIAGNOSIS — M549 Dorsalgia, unspecified: Secondary | ICD-10-CM | POA: Diagnosis not present

## 2017-06-17 DIAGNOSIS — M549 Dorsalgia, unspecified: Secondary | ICD-10-CM | POA: Diagnosis not present

## 2017-06-18 DIAGNOSIS — M549 Dorsalgia, unspecified: Secondary | ICD-10-CM | POA: Diagnosis not present

## 2017-06-19 DIAGNOSIS — M549 Dorsalgia, unspecified: Secondary | ICD-10-CM | POA: Diagnosis not present

## 2017-06-21 DIAGNOSIS — G4733 Obstructive sleep apnea (adult) (pediatric): Secondary | ICD-10-CM | POA: Diagnosis not present

## 2017-06-22 DIAGNOSIS — M549 Dorsalgia, unspecified: Secondary | ICD-10-CM | POA: Diagnosis not present

## 2017-06-23 DIAGNOSIS — M549 Dorsalgia, unspecified: Secondary | ICD-10-CM | POA: Diagnosis not present

## 2017-06-24 DIAGNOSIS — M549 Dorsalgia, unspecified: Secondary | ICD-10-CM | POA: Diagnosis not present

## 2017-06-25 DIAGNOSIS — M549 Dorsalgia, unspecified: Secondary | ICD-10-CM | POA: Diagnosis not present

## 2017-06-26 DIAGNOSIS — M549 Dorsalgia, unspecified: Secondary | ICD-10-CM | POA: Diagnosis not present

## 2017-06-29 DIAGNOSIS — M549 Dorsalgia, unspecified: Secondary | ICD-10-CM | POA: Diagnosis not present

## 2017-06-30 DIAGNOSIS — E7849 Other hyperlipidemia: Secondary | ICD-10-CM | POA: Diagnosis not present

## 2017-06-30 DIAGNOSIS — I10 Essential (primary) hypertension: Secondary | ICD-10-CM | POA: Diagnosis not present

## 2017-06-30 DIAGNOSIS — M549 Dorsalgia, unspecified: Secondary | ICD-10-CM | POA: Diagnosis not present

## 2017-06-30 DIAGNOSIS — E119 Type 2 diabetes mellitus without complications: Secondary | ICD-10-CM | POA: Diagnosis not present

## 2017-06-30 DIAGNOSIS — R82998 Other abnormal findings in urine: Secondary | ICD-10-CM | POA: Diagnosis not present

## 2017-07-01 DIAGNOSIS — M549 Dorsalgia, unspecified: Secondary | ICD-10-CM | POA: Diagnosis not present

## 2017-07-02 DIAGNOSIS — M549 Dorsalgia, unspecified: Secondary | ICD-10-CM | POA: Diagnosis not present

## 2017-07-03 DIAGNOSIS — M549 Dorsalgia, unspecified: Secondary | ICD-10-CM | POA: Diagnosis not present

## 2017-07-06 DIAGNOSIS — M549 Dorsalgia, unspecified: Secondary | ICD-10-CM | POA: Diagnosis not present

## 2017-07-07 DIAGNOSIS — R3121 Asymptomatic microscopic hematuria: Secondary | ICD-10-CM | POA: Insufficient documentation

## 2017-07-07 DIAGNOSIS — E7849 Other hyperlipidemia: Secondary | ICD-10-CM | POA: Diagnosis not present

## 2017-07-07 DIAGNOSIS — Z23 Encounter for immunization: Secondary | ICD-10-CM | POA: Diagnosis not present

## 2017-07-07 DIAGNOSIS — G4733 Obstructive sleep apnea (adult) (pediatric): Secondary | ICD-10-CM | POA: Diagnosis not present

## 2017-07-07 DIAGNOSIS — R0683 Snoring: Secondary | ICD-10-CM | POA: Diagnosis not present

## 2017-07-07 DIAGNOSIS — M5489 Other dorsalgia: Secondary | ICD-10-CM | POA: Diagnosis not present

## 2017-07-07 DIAGNOSIS — D649 Anemia, unspecified: Secondary | ICD-10-CM | POA: Insufficient documentation

## 2017-07-07 DIAGNOSIS — M549 Dorsalgia, unspecified: Secondary | ICD-10-CM | POA: Diagnosis not present

## 2017-07-07 DIAGNOSIS — Z0000 Encounter for general adult medical examination without abnormal findings: Secondary | ICD-10-CM | POA: Diagnosis not present

## 2017-07-07 DIAGNOSIS — E668 Other obesity: Secondary | ICD-10-CM | POA: Diagnosis not present

## 2017-07-07 DIAGNOSIS — M79669 Pain in unspecified lower leg: Secondary | ICD-10-CM | POA: Diagnosis not present

## 2017-07-07 DIAGNOSIS — G608 Other hereditary and idiopathic neuropathies: Secondary | ICD-10-CM | POA: Diagnosis not present

## 2017-07-07 DIAGNOSIS — I509 Heart failure, unspecified: Secondary | ICD-10-CM | POA: Diagnosis not present

## 2017-07-08 ENCOUNTER — Other Ambulatory Visit: Payer: Self-pay | Admitting: Internal Medicine

## 2017-07-08 DIAGNOSIS — Z139 Encounter for screening, unspecified: Secondary | ICD-10-CM

## 2017-07-08 DIAGNOSIS — M549 Dorsalgia, unspecified: Secondary | ICD-10-CM | POA: Diagnosis not present

## 2017-07-09 DIAGNOSIS — M549 Dorsalgia, unspecified: Secondary | ICD-10-CM | POA: Diagnosis not present

## 2017-07-10 DIAGNOSIS — M549 Dorsalgia, unspecified: Secondary | ICD-10-CM | POA: Diagnosis not present

## 2017-07-10 DIAGNOSIS — Z1212 Encounter for screening for malignant neoplasm of rectum: Secondary | ICD-10-CM | POA: Diagnosis not present

## 2017-07-13 DIAGNOSIS — M549 Dorsalgia, unspecified: Secondary | ICD-10-CM | POA: Diagnosis not present

## 2017-07-14 DIAGNOSIS — M549 Dorsalgia, unspecified: Secondary | ICD-10-CM | POA: Diagnosis not present

## 2017-07-15 DIAGNOSIS — M549 Dorsalgia, unspecified: Secondary | ICD-10-CM | POA: Diagnosis not present

## 2017-07-16 DIAGNOSIS — M549 Dorsalgia, unspecified: Secondary | ICD-10-CM | POA: Diagnosis not present

## 2017-07-17 DIAGNOSIS — M549 Dorsalgia, unspecified: Secondary | ICD-10-CM | POA: Diagnosis not present

## 2017-07-20 DIAGNOSIS — M549 Dorsalgia, unspecified: Secondary | ICD-10-CM | POA: Diagnosis not present

## 2017-07-21 DIAGNOSIS — M549 Dorsalgia, unspecified: Secondary | ICD-10-CM | POA: Diagnosis not present

## 2017-07-22 DIAGNOSIS — M549 Dorsalgia, unspecified: Secondary | ICD-10-CM | POA: Diagnosis not present

## 2017-07-23 DIAGNOSIS — M549 Dorsalgia, unspecified: Secondary | ICD-10-CM | POA: Diagnosis not present

## 2017-07-24 DIAGNOSIS — M549 Dorsalgia, unspecified: Secondary | ICD-10-CM | POA: Diagnosis not present

## 2017-07-27 DIAGNOSIS — M549 Dorsalgia, unspecified: Secondary | ICD-10-CM | POA: Diagnosis not present

## 2017-07-28 DIAGNOSIS — M549 Dorsalgia, unspecified: Secondary | ICD-10-CM | POA: Diagnosis not present

## 2017-07-29 DIAGNOSIS — M549 Dorsalgia, unspecified: Secondary | ICD-10-CM | POA: Diagnosis not present

## 2017-07-30 DIAGNOSIS — M549 Dorsalgia, unspecified: Secondary | ICD-10-CM | POA: Diagnosis not present

## 2017-07-31 DIAGNOSIS — M549 Dorsalgia, unspecified: Secondary | ICD-10-CM | POA: Diagnosis not present

## 2017-08-03 DIAGNOSIS — M549 Dorsalgia, unspecified: Secondary | ICD-10-CM | POA: Diagnosis not present

## 2017-08-04 DIAGNOSIS — M549 Dorsalgia, unspecified: Secondary | ICD-10-CM | POA: Diagnosis not present

## 2017-08-05 DIAGNOSIS — M549 Dorsalgia, unspecified: Secondary | ICD-10-CM | POA: Diagnosis not present

## 2017-08-06 DIAGNOSIS — M549 Dorsalgia, unspecified: Secondary | ICD-10-CM | POA: Diagnosis not present

## 2017-08-07 DIAGNOSIS — M549 Dorsalgia, unspecified: Secondary | ICD-10-CM | POA: Diagnosis not present

## 2017-08-10 DIAGNOSIS — M549 Dorsalgia, unspecified: Secondary | ICD-10-CM | POA: Diagnosis not present

## 2017-08-11 DIAGNOSIS — M549 Dorsalgia, unspecified: Secondary | ICD-10-CM | POA: Diagnosis not present

## 2017-08-12 DIAGNOSIS — M549 Dorsalgia, unspecified: Secondary | ICD-10-CM | POA: Diagnosis not present

## 2017-08-13 ENCOUNTER — Telehealth: Payer: Self-pay | Admitting: *Deleted

## 2017-08-13 DIAGNOSIS — M549 Dorsalgia, unspecified: Secondary | ICD-10-CM | POA: Diagnosis not present

## 2017-08-13 NOTE — Telephone Encounter (Signed)
Dr Leone Payor,  This pt is scheduled for a direct in the [facility] on 1-21 Monday- PV is 1-7 Monday . She has a BMI of 51.35, weight 283.0lb.   She has a history of OSA, HTN, GERD, DM, HLD, , DJD. No Gi history.   Do you want her to have an OV or can she be direct to [facility]?  Please advise and thanks for your time,   Elizebeth Brooking

## 2017-08-13 NOTE — Telephone Encounter (Signed)
Sheri, Can you please schedule this for me  Thanks, Elizebeth Brooking

## 2017-08-13 NOTE — Telephone Encounter (Signed)
Direct at hospital is ok 

## 2017-08-14 ENCOUNTER — Ambulatory Visit: Payer: Medicare HMO

## 2017-08-14 DIAGNOSIS — M549 Dorsalgia, unspecified: Secondary | ICD-10-CM | POA: Diagnosis not present

## 2017-08-17 DIAGNOSIS — M549 Dorsalgia, unspecified: Secondary | ICD-10-CM | POA: Diagnosis not present

## 2017-08-18 DIAGNOSIS — M549 Dorsalgia, unspecified: Secondary | ICD-10-CM | POA: Diagnosis not present

## 2017-08-19 DIAGNOSIS — M549 Dorsalgia, unspecified: Secondary | ICD-10-CM | POA: Diagnosis not present

## 2017-08-19 NOTE — Telephone Encounter (Signed)
She has been scheduled for 10/12/17 11:45.  She will need to arrive at 10:15 at [facility]

## 2017-08-20 DIAGNOSIS — M549 Dorsalgia, unspecified: Secondary | ICD-10-CM | POA: Diagnosis not present

## 2017-08-21 DIAGNOSIS — M549 Dorsalgia, unspecified: Secondary | ICD-10-CM | POA: Diagnosis not present

## 2017-08-24 DIAGNOSIS — M549 Dorsalgia, unspecified: Secondary | ICD-10-CM | POA: Diagnosis not present

## 2017-08-25 DIAGNOSIS — M549 Dorsalgia, unspecified: Secondary | ICD-10-CM | POA: Diagnosis not present

## 2017-08-26 DIAGNOSIS — M549 Dorsalgia, unspecified: Secondary | ICD-10-CM | POA: Diagnosis not present

## 2017-08-27 DIAGNOSIS — M549 Dorsalgia, unspecified: Secondary | ICD-10-CM | POA: Diagnosis not present

## 2017-08-28 DIAGNOSIS — M549 Dorsalgia, unspecified: Secondary | ICD-10-CM | POA: Diagnosis not present

## 2017-08-31 DIAGNOSIS — M549 Dorsalgia, unspecified: Secondary | ICD-10-CM | POA: Diagnosis not present

## 2017-09-01 DIAGNOSIS — M549 Dorsalgia, unspecified: Secondary | ICD-10-CM | POA: Diagnosis not present

## 2017-09-02 DIAGNOSIS — M549 Dorsalgia, unspecified: Secondary | ICD-10-CM | POA: Diagnosis not present

## 2017-09-03 DIAGNOSIS — M549 Dorsalgia, unspecified: Secondary | ICD-10-CM | POA: Diagnosis not present

## 2017-09-04 DIAGNOSIS — M549 Dorsalgia, unspecified: Secondary | ICD-10-CM | POA: Diagnosis not present

## 2017-09-07 ENCOUNTER — Encounter: Payer: Medicare HMO | Admitting: Internal Medicine

## 2017-09-07 DIAGNOSIS — M549 Dorsalgia, unspecified: Secondary | ICD-10-CM | POA: Diagnosis not present

## 2017-09-08 DIAGNOSIS — M549 Dorsalgia, unspecified: Secondary | ICD-10-CM | POA: Diagnosis not present

## 2017-09-09 DIAGNOSIS — M549 Dorsalgia, unspecified: Secondary | ICD-10-CM | POA: Diagnosis not present

## 2017-09-10 DIAGNOSIS — M549 Dorsalgia, unspecified: Secondary | ICD-10-CM | POA: Diagnosis not present

## 2017-09-11 DIAGNOSIS — M549 Dorsalgia, unspecified: Secondary | ICD-10-CM | POA: Diagnosis not present

## 2017-09-14 DIAGNOSIS — M549 Dorsalgia, unspecified: Secondary | ICD-10-CM | POA: Diagnosis not present

## 2017-09-15 DIAGNOSIS — M549 Dorsalgia, unspecified: Secondary | ICD-10-CM | POA: Diagnosis not present

## 2017-09-16 DIAGNOSIS — M549 Dorsalgia, unspecified: Secondary | ICD-10-CM | POA: Diagnosis not present

## 2017-09-17 DIAGNOSIS — M549 Dorsalgia, unspecified: Secondary | ICD-10-CM | POA: Diagnosis not present

## 2017-09-18 DIAGNOSIS — M549 Dorsalgia, unspecified: Secondary | ICD-10-CM | POA: Diagnosis not present

## 2017-09-21 DIAGNOSIS — M549 Dorsalgia, unspecified: Secondary | ICD-10-CM | POA: Diagnosis not present

## 2017-09-22 DIAGNOSIS — M549 Dorsalgia, unspecified: Secondary | ICD-10-CM | POA: Diagnosis not present

## 2017-09-23 DIAGNOSIS — M549 Dorsalgia, unspecified: Secondary | ICD-10-CM | POA: Diagnosis not present

## 2017-09-24 DIAGNOSIS — M549 Dorsalgia, unspecified: Secondary | ICD-10-CM | POA: Diagnosis not present

## 2017-09-25 ENCOUNTER — Encounter (HOSPITAL_COMMUNITY): Payer: Self-pay | Admitting: Emergency Medicine

## 2017-09-25 ENCOUNTER — Other Ambulatory Visit: Payer: Self-pay

## 2017-09-25 DIAGNOSIS — M549 Dorsalgia, unspecified: Secondary | ICD-10-CM | POA: Diagnosis not present

## 2017-09-28 DIAGNOSIS — M549 Dorsalgia, unspecified: Secondary | ICD-10-CM | POA: Diagnosis not present

## 2017-09-29 ENCOUNTER — Ambulatory Visit (AMBULATORY_SURGERY_CENTER): Payer: Self-pay | Admitting: *Deleted

## 2017-09-29 ENCOUNTER — Other Ambulatory Visit: Payer: Self-pay

## 2017-09-29 VITALS — Ht 63.0 in | Wt 287.8 lb

## 2017-09-29 DIAGNOSIS — Z1211 Encounter for screening for malignant neoplasm of colon: Secondary | ICD-10-CM

## 2017-09-29 DIAGNOSIS — M549 Dorsalgia, unspecified: Secondary | ICD-10-CM | POA: Diagnosis not present

## 2017-09-29 NOTE — Progress Notes (Signed)
No egg or soy allergy known to patient  No issues with past sedation with any surgeries  or procedures, no intubation problems  No diet pills per patient No home 02 use per patient  No blood thinners per patient  Pt denies issues with constipation  No A fib or A flutter  EMMI video sent to pt's e mail -- no email   

## 2017-09-30 DIAGNOSIS — M549 Dorsalgia, unspecified: Secondary | ICD-10-CM | POA: Diagnosis not present

## 2017-10-01 DIAGNOSIS — M549 Dorsalgia, unspecified: Secondary | ICD-10-CM | POA: Diagnosis not present

## 2017-10-02 DIAGNOSIS — M549 Dorsalgia, unspecified: Secondary | ICD-10-CM | POA: Diagnosis not present

## 2017-10-05 DIAGNOSIS — M549 Dorsalgia, unspecified: Secondary | ICD-10-CM | POA: Diagnosis not present

## 2017-10-06 DIAGNOSIS — M549 Dorsalgia, unspecified: Secondary | ICD-10-CM | POA: Diagnosis not present

## 2017-10-07 DIAGNOSIS — M549 Dorsalgia, unspecified: Secondary | ICD-10-CM | POA: Diagnosis not present

## 2017-10-08 DIAGNOSIS — M549 Dorsalgia, unspecified: Secondary | ICD-10-CM | POA: Diagnosis not present

## 2017-10-09 DIAGNOSIS — M549 Dorsalgia, unspecified: Secondary | ICD-10-CM | POA: Diagnosis not present

## 2017-10-12 ENCOUNTER — Ambulatory Visit (HOSPITAL_COMMUNITY): Admission: RE | Admit: 2017-10-12 | Payer: Medicare HMO | Source: Ambulatory Visit | Admitting: Internal Medicine

## 2017-10-12 DIAGNOSIS — M549 Dorsalgia, unspecified: Secondary | ICD-10-CM | POA: Diagnosis not present

## 2017-10-12 SURGERY — COLONOSCOPY WITH PROPOFOL
Anesthesia: Monitor Anesthesia Care

## 2017-10-12 NOTE — Progress Notes (Signed)
Patient called to cancel colonoscopy due to nausea and vomiting with prep.  Roselie Awkward, RN

## 2017-10-13 DIAGNOSIS — M549 Dorsalgia, unspecified: Secondary | ICD-10-CM | POA: Diagnosis not present

## 2017-10-14 DIAGNOSIS — M549 Dorsalgia, unspecified: Secondary | ICD-10-CM | POA: Diagnosis not present

## 2017-10-15 DIAGNOSIS — M549 Dorsalgia, unspecified: Secondary | ICD-10-CM | POA: Diagnosis not present

## 2017-10-16 DIAGNOSIS — M549 Dorsalgia, unspecified: Secondary | ICD-10-CM | POA: Diagnosis not present

## 2017-10-16 DIAGNOSIS — G4733 Obstructive sleep apnea (adult) (pediatric): Secondary | ICD-10-CM | POA: Diagnosis not present

## 2017-10-19 DIAGNOSIS — M549 Dorsalgia, unspecified: Secondary | ICD-10-CM | POA: Diagnosis not present

## 2017-10-20 DIAGNOSIS — M549 Dorsalgia, unspecified: Secondary | ICD-10-CM | POA: Diagnosis not present

## 2017-10-21 DIAGNOSIS — M549 Dorsalgia, unspecified: Secondary | ICD-10-CM | POA: Diagnosis not present

## 2017-10-22 DIAGNOSIS — M549 Dorsalgia, unspecified: Secondary | ICD-10-CM | POA: Diagnosis not present

## 2017-10-23 DIAGNOSIS — M549 Dorsalgia, unspecified: Secondary | ICD-10-CM | POA: Diagnosis not present

## 2017-10-26 DIAGNOSIS — M549 Dorsalgia, unspecified: Secondary | ICD-10-CM | POA: Diagnosis not present

## 2017-10-27 DIAGNOSIS — M549 Dorsalgia, unspecified: Secondary | ICD-10-CM | POA: Diagnosis not present

## 2017-10-28 DIAGNOSIS — M549 Dorsalgia, unspecified: Secondary | ICD-10-CM | POA: Diagnosis not present

## 2017-10-29 DIAGNOSIS — M549 Dorsalgia, unspecified: Secondary | ICD-10-CM | POA: Diagnosis not present

## 2017-10-30 DIAGNOSIS — M549 Dorsalgia, unspecified: Secondary | ICD-10-CM | POA: Diagnosis not present

## 2017-11-02 DIAGNOSIS — E11621 Type 2 diabetes mellitus with foot ulcer: Secondary | ICD-10-CM | POA: Diagnosis not present

## 2017-11-02 DIAGNOSIS — R5381 Other malaise: Secondary | ICD-10-CM | POA: Diagnosis not present

## 2017-11-02 DIAGNOSIS — I509 Heart failure, unspecified: Secondary | ICD-10-CM | POA: Diagnosis not present

## 2017-11-02 DIAGNOSIS — J019 Acute sinusitis, unspecified: Secondary | ICD-10-CM | POA: Diagnosis not present

## 2017-11-02 DIAGNOSIS — R300 Dysuria: Secondary | ICD-10-CM | POA: Diagnosis not present

## 2017-11-02 DIAGNOSIS — M25571 Pain in right ankle and joints of right foot: Secondary | ICD-10-CM | POA: Diagnosis not present

## 2017-11-02 DIAGNOSIS — Z6841 Body Mass Index (BMI) 40.0 and over, adult: Secondary | ICD-10-CM | POA: Diagnosis not present

## 2017-11-02 DIAGNOSIS — M549 Dorsalgia, unspecified: Secondary | ICD-10-CM | POA: Diagnosis not present

## 2017-11-03 DIAGNOSIS — M2550 Pain in unspecified joint: Secondary | ICD-10-CM | POA: Diagnosis not present

## 2017-11-03 DIAGNOSIS — M549 Dorsalgia, unspecified: Secondary | ICD-10-CM | POA: Diagnosis not present

## 2017-11-04 DIAGNOSIS — M549 Dorsalgia, unspecified: Secondary | ICD-10-CM | POA: Diagnosis not present

## 2017-11-05 DIAGNOSIS — M549 Dorsalgia, unspecified: Secondary | ICD-10-CM | POA: Diagnosis not present

## 2017-11-06 DIAGNOSIS — M549 Dorsalgia, unspecified: Secondary | ICD-10-CM | POA: Diagnosis not present

## 2017-11-09 DIAGNOSIS — M549 Dorsalgia, unspecified: Secondary | ICD-10-CM | POA: Diagnosis not present

## 2017-11-10 DIAGNOSIS — M549 Dorsalgia, unspecified: Secondary | ICD-10-CM | POA: Diagnosis not present

## 2017-11-11 DIAGNOSIS — M0689 Other specified rheumatoid arthritis, multiple sites: Secondary | ICD-10-CM | POA: Diagnosis not present

## 2017-11-11 DIAGNOSIS — Z6841 Body Mass Index (BMI) 40.0 and over, adult: Secondary | ICD-10-CM | POA: Diagnosis not present

## 2017-11-11 DIAGNOSIS — R5381 Other malaise: Secondary | ICD-10-CM | POA: Diagnosis not present

## 2017-11-11 DIAGNOSIS — M2550 Pain in unspecified joint: Secondary | ICD-10-CM | POA: Diagnosis not present

## 2017-11-11 DIAGNOSIS — M549 Dorsalgia, unspecified: Secondary | ICD-10-CM | POA: Diagnosis not present

## 2017-11-11 DIAGNOSIS — E11621 Type 2 diabetes mellitus with foot ulcer: Secondary | ICD-10-CM | POA: Diagnosis not present

## 2017-11-12 DIAGNOSIS — M549 Dorsalgia, unspecified: Secondary | ICD-10-CM | POA: Diagnosis not present

## 2017-11-13 ENCOUNTER — Ambulatory Visit: Payer: Self-pay | Admitting: Podiatry

## 2017-11-13 DIAGNOSIS — M549 Dorsalgia, unspecified: Secondary | ICD-10-CM | POA: Diagnosis not present

## 2017-11-16 DIAGNOSIS — M549 Dorsalgia, unspecified: Secondary | ICD-10-CM | POA: Diagnosis not present

## 2017-11-17 DIAGNOSIS — M549 Dorsalgia, unspecified: Secondary | ICD-10-CM | POA: Diagnosis not present

## 2017-11-18 DIAGNOSIS — M549 Dorsalgia, unspecified: Secondary | ICD-10-CM | POA: Diagnosis not present

## 2017-11-19 DIAGNOSIS — M549 Dorsalgia, unspecified: Secondary | ICD-10-CM | POA: Diagnosis not present

## 2017-11-20 DIAGNOSIS — M549 Dorsalgia, unspecified: Secondary | ICD-10-CM | POA: Diagnosis not present

## 2017-11-23 DIAGNOSIS — M549 Dorsalgia, unspecified: Secondary | ICD-10-CM | POA: Diagnosis not present

## 2017-11-24 DIAGNOSIS — M549 Dorsalgia, unspecified: Secondary | ICD-10-CM | POA: Diagnosis not present

## 2017-11-25 DIAGNOSIS — M549 Dorsalgia, unspecified: Secondary | ICD-10-CM | POA: Diagnosis not present

## 2017-11-26 DIAGNOSIS — M549 Dorsalgia, unspecified: Secondary | ICD-10-CM | POA: Diagnosis not present

## 2017-11-27 DIAGNOSIS — M549 Dorsalgia, unspecified: Secondary | ICD-10-CM | POA: Diagnosis not present

## 2017-11-30 ENCOUNTER — Ambulatory Visit (INDEPENDENT_AMBULATORY_CARE_PROVIDER_SITE_OTHER): Payer: Medicare HMO | Admitting: Podiatry

## 2017-11-30 ENCOUNTER — Ambulatory Visit (INDEPENDENT_AMBULATORY_CARE_PROVIDER_SITE_OTHER): Payer: Medicare HMO

## 2017-11-30 ENCOUNTER — Encounter: Payer: Self-pay | Admitting: Podiatry

## 2017-11-30 VITALS — BP 142/76 | HR 65 | Ht 63.0 in | Wt 280.0 lb

## 2017-11-30 DIAGNOSIS — L97519 Non-pressure chronic ulcer of other part of right foot with unspecified severity: Secondary | ICD-10-CM | POA: Diagnosis not present

## 2017-11-30 DIAGNOSIS — L97529 Non-pressure chronic ulcer of other part of left foot with unspecified severity: Principal | ICD-10-CM

## 2017-11-30 DIAGNOSIS — M549 Dorsalgia, unspecified: Secondary | ICD-10-CM | POA: Diagnosis not present

## 2017-11-30 DIAGNOSIS — E11621 Type 2 diabetes mellitus with foot ulcer: Secondary | ICD-10-CM

## 2017-11-30 DIAGNOSIS — L309 Dermatitis, unspecified: Secondary | ICD-10-CM

## 2017-11-30 DIAGNOSIS — M722 Plantar fascial fibromatosis: Secondary | ICD-10-CM | POA: Diagnosis not present

## 2017-11-30 DIAGNOSIS — B351 Tinea unguium: Secondary | ICD-10-CM | POA: Diagnosis not present

## 2017-11-30 MED ORDER — TERBINAFINE HCL 250 MG PO TABS: 250.0000 mg | ORAL_TABLET | Freq: Every day | ORAL | 0 refills | Status: DC

## 2017-11-30 MED ORDER — TRIAMCINOLONE ACETONIDE 10 MG/ML IJ SUSP
10.0000 mg | Freq: Once | INTRAMUSCULAR | Status: AC
  Administered 2017-11-30: 10 mg

## 2017-11-30 NOTE — Patient Instructions (Signed)

## 2017-11-30 NOTE — Progress Notes (Signed)
   Subjective:    Patient ID: Katelyn Howard, female    DOB: Dec 18, 1960, 57 y.o.   MRN: [id number]  HPI  Chief Complaint  Patient presents with  . Foot Pain    right foot pain, arch pain. PCP has diagnosed with rheumatoid arthritis  . Diabetes    diabetic foot exam  . Tinea Pedis    bilateral itching, feet and especially between toes. PCP Rx'd cream, but her feet still itch       Review of Systems  Musculoskeletal: Positive for arthralgias and joint swelling.  All other systems reviewed and are negative.      Objective:   Physical Exam        Assessment & Plan:

## 2017-12-01 DIAGNOSIS — M549 Dorsalgia, unspecified: Secondary | ICD-10-CM | POA: Diagnosis not present

## 2017-12-02 DIAGNOSIS — M549 Dorsalgia, unspecified: Secondary | ICD-10-CM | POA: Diagnosis not present

## 2017-12-02 IMAGING — DX DG CHEST 2V
2 series · 2 of 2 positions shown · non-contrast
Comparison: 09/09/2015

CLINICAL DATA: Cough, body aches and low-grade fever for over week,
history chronic asthma, bronchitis, CHF, hypertension, coronary
artery disease

EXAM:
CHEST  2 VIEW

[chest pa]
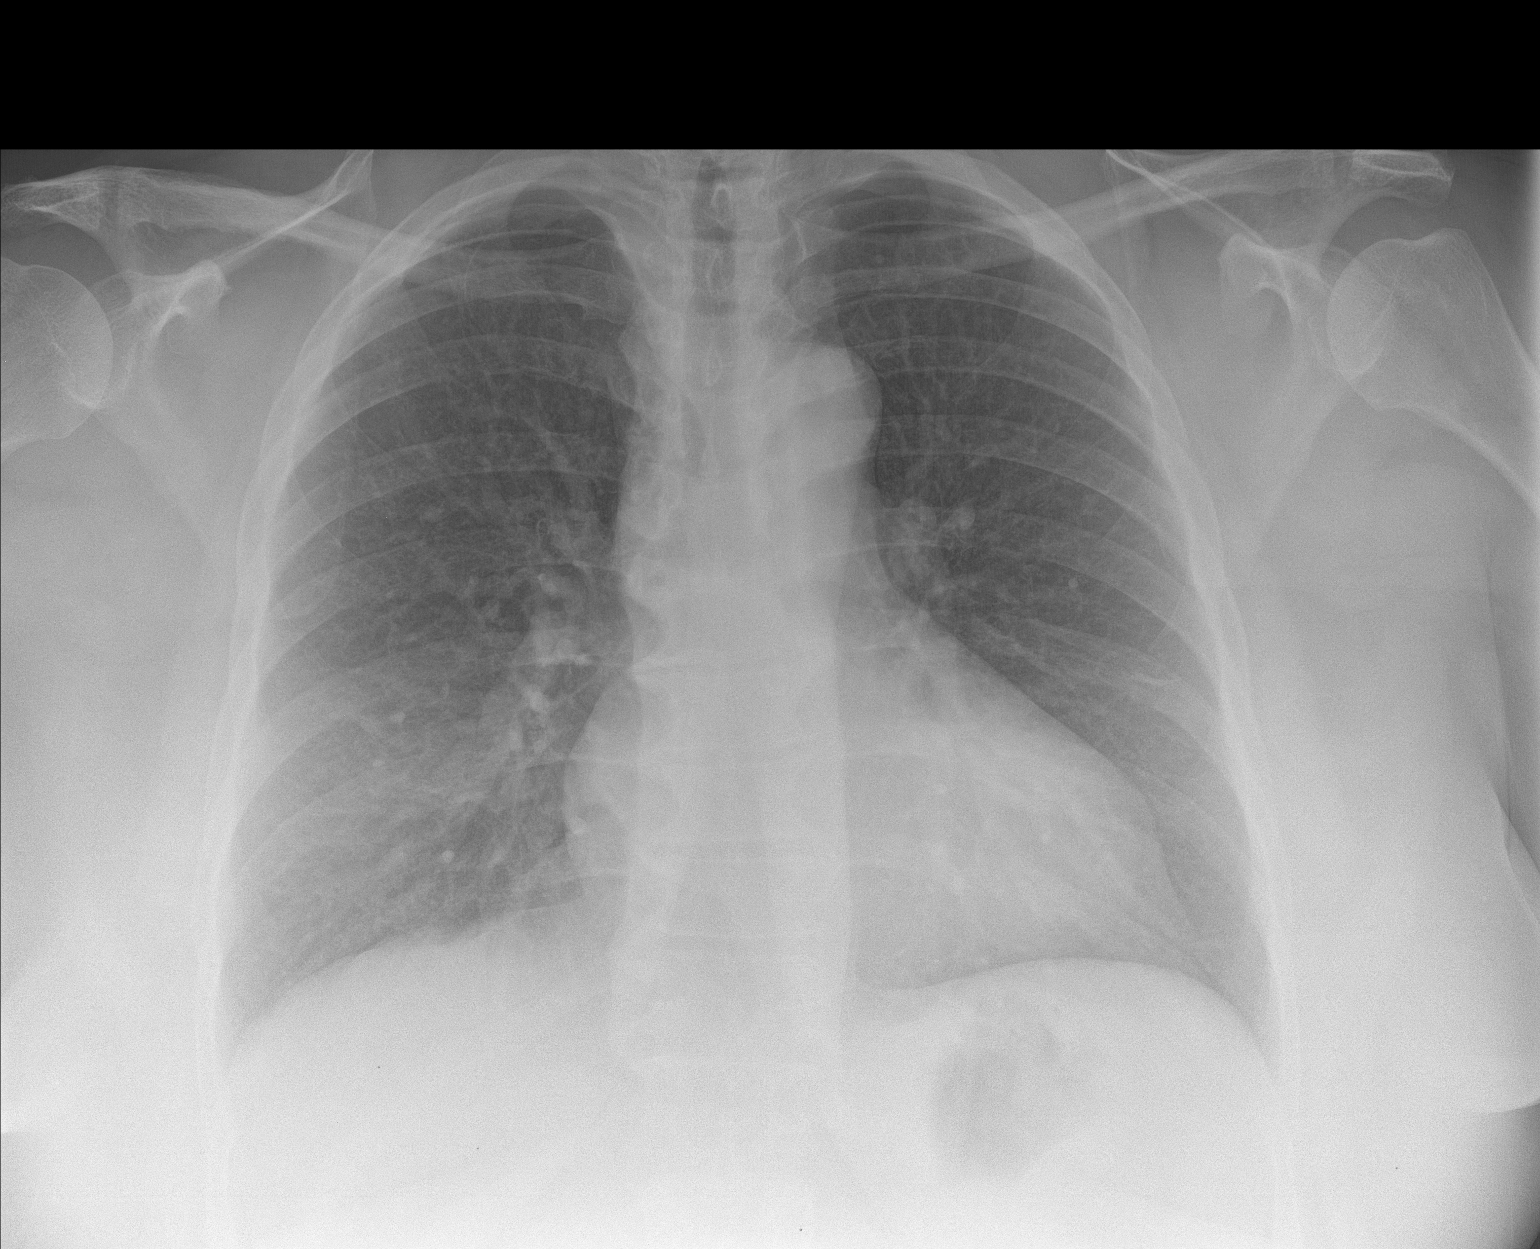

[chest lat]
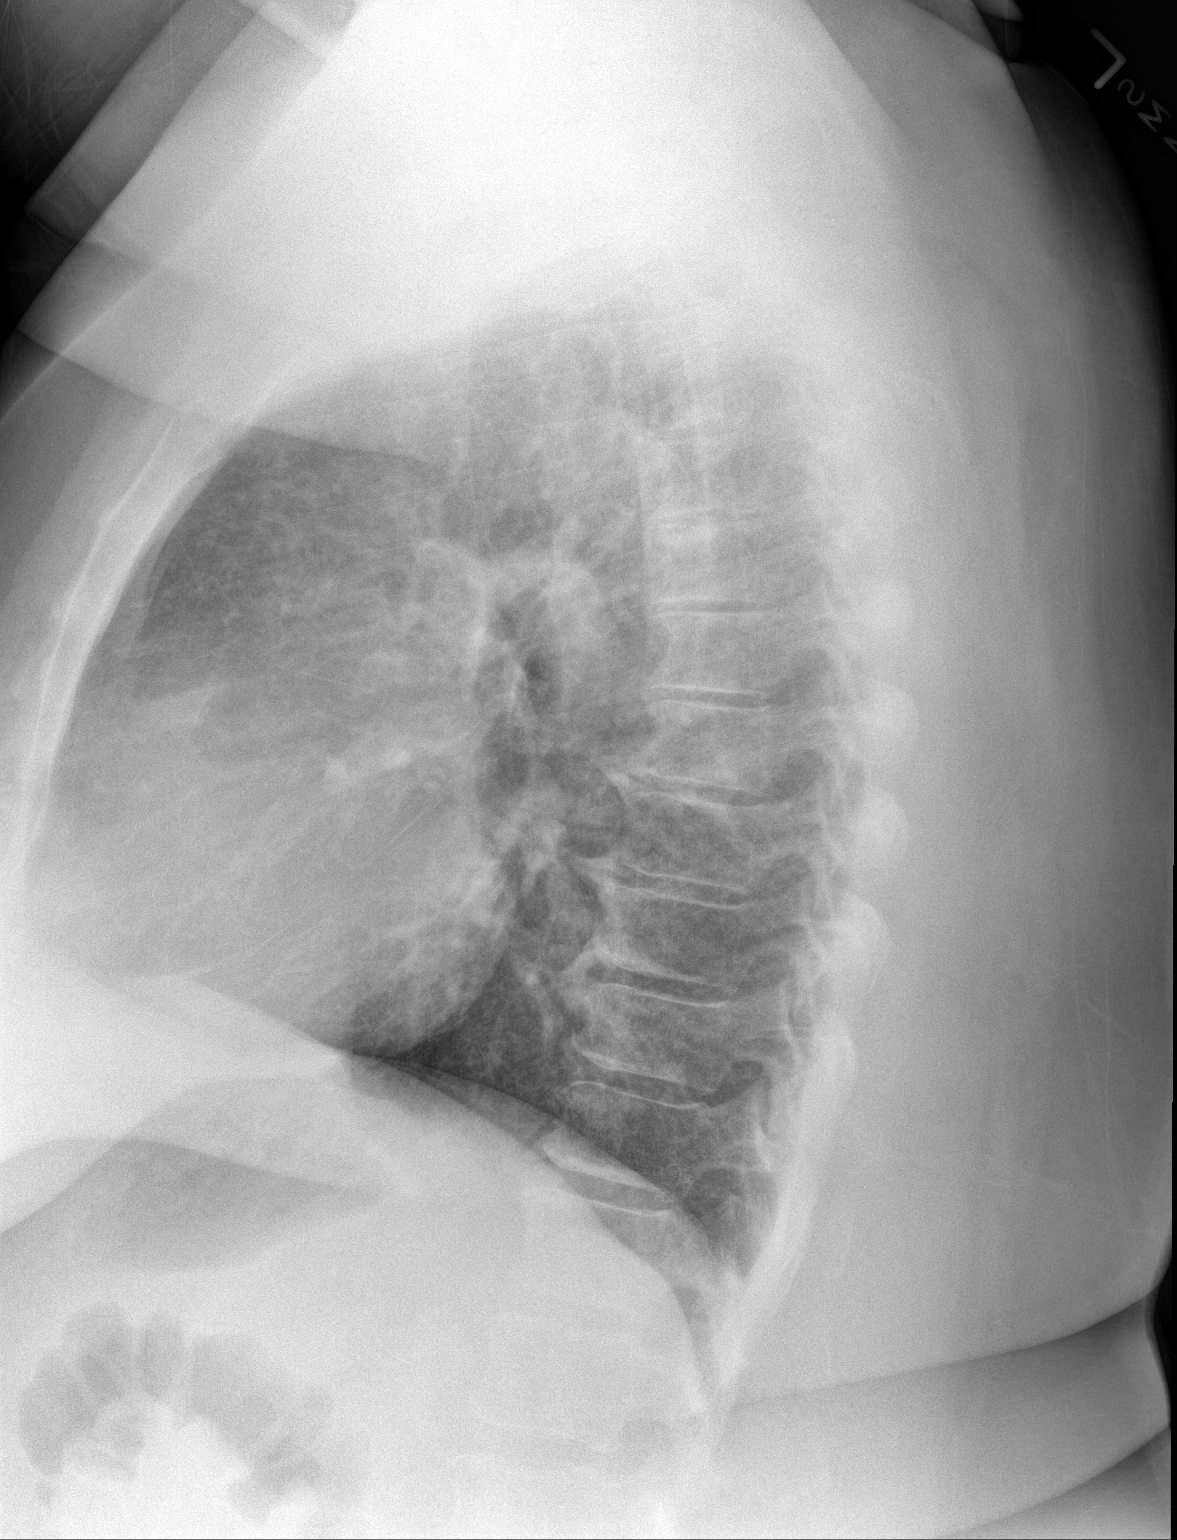

[2 of 2 positions shown; findings below may reference images not displayed]

FINDINGS: Enlargement of cardiac silhouette.

Mediastinal contours and pulmonary vascularity normal.

Tiny linear scarring in lingula unchanged.

Lungs otherwise clear.

No infiltrate, pleural effusion or pneumothorax.

Multilevel endplate spur formation thoracic spine.
IMPRESSION: Enlargement of cardiac silhouette with minimal lingular scarring.

No acute abnormalities.

## 2017-12-03 DIAGNOSIS — M549 Dorsalgia, unspecified: Secondary | ICD-10-CM | POA: Diagnosis not present

## 2017-12-03 NOTE — Progress Notes (Signed)
Subjective:   Patient ID: Katelyn Howard, female   DOB: 57 y.o.   MRN: [id number]   HPI She does not remember specific injury.  Patient does not smoke currently and likes to be active patient presents with a lot of right involving the several months   Review of Systems  All other systems reviewed and are negative.       Objective:  Physical Exam  Constitutional: She appears well-developed and well-nourished.  Cardiovascular: Intact distal pulses.  Pulmonary/Chest: Effort normal.  Musculoskeletal: Normal range of motion.  Neurological: She is alert.  Skin: Skin is warm.  Nursing note and vitals reviewed.   Neurovascular status intact muscle strength is adequate range of motion within normal limits with patient found to have exquisite discomfort plantar mid arch area right with fluid buildup noted around the tendon complex.  Patient is found to have good digital perfusion and is well oriented x3 and has discoloration of the nails but no fungal infection across the entire nailbeds     Assessment:  Plantar fasciitis right acute nature with obesity is complicating factor and mild mycotic nail infection     Plan:  H&P x-rays reviewed conditions discussed.  Today I went ahead and injected the plantar fascia right 3 mg Kenalog 5 mg Xylocaine and instructed on anti-inflammatory supportive shoes and dispense fascial brace to lift the arch up.  I do not recommend treatment currently for mycotic component visits not significant and we can look at that again at one point in future  X-ray taken right indicated spur formation but negative for signs of stress fracture or advanced arthritis with moderate depression of the

## 2017-12-04 DIAGNOSIS — M549 Dorsalgia, unspecified: Secondary | ICD-10-CM | POA: Diagnosis not present

## 2017-12-07 DIAGNOSIS — M549 Dorsalgia, unspecified: Secondary | ICD-10-CM | POA: Diagnosis not present

## 2017-12-08 DIAGNOSIS — M549 Dorsalgia, unspecified: Secondary | ICD-10-CM | POA: Diagnosis not present

## 2017-12-09 DIAGNOSIS — M549 Dorsalgia, unspecified: Secondary | ICD-10-CM | POA: Diagnosis not present

## 2017-12-10 DIAGNOSIS — M549 Dorsalgia, unspecified: Secondary | ICD-10-CM | POA: Diagnosis not present

## 2017-12-11 DIAGNOSIS — M549 Dorsalgia, unspecified: Secondary | ICD-10-CM | POA: Diagnosis not present

## 2017-12-14 DIAGNOSIS — M549 Dorsalgia, unspecified: Secondary | ICD-10-CM | POA: Diagnosis not present

## 2017-12-15 DIAGNOSIS — M549 Dorsalgia, unspecified: Secondary | ICD-10-CM | POA: Diagnosis not present

## 2017-12-16 DIAGNOSIS — M549 Dorsalgia, unspecified: Secondary | ICD-10-CM | POA: Diagnosis not present

## 2017-12-17 DIAGNOSIS — M549 Dorsalgia, unspecified: Secondary | ICD-10-CM | POA: Diagnosis not present

## 2017-12-18 DIAGNOSIS — M549 Dorsalgia, unspecified: Secondary | ICD-10-CM | POA: Diagnosis not present

## 2017-12-21 ENCOUNTER — Encounter: Payer: Self-pay | Admitting: Podiatry

## 2017-12-21 ENCOUNTER — Ambulatory Visit (INDEPENDENT_AMBULATORY_CARE_PROVIDER_SITE_OTHER): Payer: Medicare HMO | Admitting: Podiatry

## 2017-12-21 DIAGNOSIS — L309 Dermatitis, unspecified: Secondary | ICD-10-CM | POA: Diagnosis not present

## 2017-12-21 DIAGNOSIS — M549 Dorsalgia, unspecified: Secondary | ICD-10-CM | POA: Diagnosis not present

## 2017-12-21 DIAGNOSIS — M722 Plantar fascial fibromatosis: Secondary | ICD-10-CM | POA: Diagnosis not present

## 2017-12-22 DIAGNOSIS — M549 Dorsalgia, unspecified: Secondary | ICD-10-CM | POA: Diagnosis not present

## 2017-12-23 DIAGNOSIS — M549 Dorsalgia, unspecified: Secondary | ICD-10-CM | POA: Diagnosis not present

## 2017-12-23 NOTE — Progress Notes (Signed)
Subjective:   Patient ID: Katelyn Howard, female   DOB: 57 y.o.   MRN: [id number]   HPI Patient presents stating that he will is feeling quite a bit better and also the patient has concerns about skin condition neurovascular status intact with significant diminishment of plantar fascial inflammation right   ROS      Objective:  Physical Exam  With discoloration of both feet that is localized with no blistering or other pathology     Assessment:  Fasciitis along with dermatitis condition     Plan:  Discussed both conditions and recommended continued physical therapy and Vaseline usage under occlusion to try to control the dry skin dermatitis condition.  Reappoint if symptoms indicate

## 2017-12-24 DIAGNOSIS — M549 Dorsalgia, unspecified: Secondary | ICD-10-CM | POA: Diagnosis not present

## 2017-12-25 DIAGNOSIS — M549 Dorsalgia, unspecified: Secondary | ICD-10-CM | POA: Diagnosis not present

## 2017-12-28 DIAGNOSIS — M549 Dorsalgia, unspecified: Secondary | ICD-10-CM | POA: Diagnosis not present

## 2017-12-29 DIAGNOSIS — M549 Dorsalgia, unspecified: Secondary | ICD-10-CM | POA: Diagnosis not present

## 2017-12-30 DIAGNOSIS — M549 Dorsalgia, unspecified: Secondary | ICD-10-CM | POA: Diagnosis not present

## 2017-12-31 DIAGNOSIS — M549 Dorsalgia, unspecified: Secondary | ICD-10-CM | POA: Diagnosis not present

## 2018-01-01 DIAGNOSIS — M549 Dorsalgia, unspecified: Secondary | ICD-10-CM | POA: Diagnosis not present

## 2018-01-04 DIAGNOSIS — M549 Dorsalgia, unspecified: Secondary | ICD-10-CM | POA: Diagnosis not present

## 2018-01-05 DIAGNOSIS — M549 Dorsalgia, unspecified: Secondary | ICD-10-CM | POA: Diagnosis not present

## 2018-01-06 DIAGNOSIS — M549 Dorsalgia, unspecified: Secondary | ICD-10-CM | POA: Diagnosis not present

## 2018-01-07 DIAGNOSIS — M549 Dorsalgia, unspecified: Secondary | ICD-10-CM | POA: Diagnosis not present

## 2018-01-08 DIAGNOSIS — M549 Dorsalgia, unspecified: Secondary | ICD-10-CM | POA: Diagnosis not present

## 2018-01-11 DIAGNOSIS — M549 Dorsalgia, unspecified: Secondary | ICD-10-CM | POA: Diagnosis not present

## 2018-01-12 DIAGNOSIS — M549 Dorsalgia, unspecified: Secondary | ICD-10-CM | POA: Diagnosis not present

## 2018-01-13 DIAGNOSIS — M549 Dorsalgia, unspecified: Secondary | ICD-10-CM | POA: Diagnosis not present

## 2018-01-14 DIAGNOSIS — M549 Dorsalgia, unspecified: Secondary | ICD-10-CM | POA: Diagnosis not present

## 2018-01-15 DIAGNOSIS — M549 Dorsalgia, unspecified: Secondary | ICD-10-CM | POA: Diagnosis not present

## 2018-01-18 DIAGNOSIS — M549 Dorsalgia, unspecified: Secondary | ICD-10-CM | POA: Diagnosis not present

## 2018-01-18 DIAGNOSIS — G4733 Obstructive sleep apnea (adult) (pediatric): Secondary | ICD-10-CM | POA: Diagnosis not present

## 2018-01-19 DIAGNOSIS — M549 Dorsalgia, unspecified: Secondary | ICD-10-CM | POA: Diagnosis not present

## 2018-01-20 DIAGNOSIS — M549 Dorsalgia, unspecified: Secondary | ICD-10-CM | POA: Diagnosis not present

## 2018-01-21 DIAGNOSIS — M549 Dorsalgia, unspecified: Secondary | ICD-10-CM | POA: Diagnosis not present

## 2018-01-22 DIAGNOSIS — M549 Dorsalgia, unspecified: Secondary | ICD-10-CM | POA: Diagnosis not present

## 2018-01-25 DIAGNOSIS — M549 Dorsalgia, unspecified: Secondary | ICD-10-CM | POA: Diagnosis not present

## 2018-01-26 DIAGNOSIS — M549 Dorsalgia, unspecified: Secondary | ICD-10-CM | POA: Diagnosis not present

## 2018-01-27 DIAGNOSIS — M549 Dorsalgia, unspecified: Secondary | ICD-10-CM | POA: Diagnosis not present

## 2018-01-28 DIAGNOSIS — M549 Dorsalgia, unspecified: Secondary | ICD-10-CM | POA: Diagnosis not present

## 2018-01-29 DIAGNOSIS — M549 Dorsalgia, unspecified: Secondary | ICD-10-CM | POA: Diagnosis not present

## 2018-02-01 DIAGNOSIS — M549 Dorsalgia, unspecified: Secondary | ICD-10-CM | POA: Diagnosis not present

## 2018-02-02 DIAGNOSIS — M549 Dorsalgia, unspecified: Secondary | ICD-10-CM | POA: Diagnosis not present

## 2018-02-03 DIAGNOSIS — M549 Dorsalgia, unspecified: Secondary | ICD-10-CM | POA: Diagnosis not present

## 2018-02-04 DIAGNOSIS — M549 Dorsalgia, unspecified: Secondary | ICD-10-CM | POA: Diagnosis not present

## 2018-02-05 DIAGNOSIS — M549 Dorsalgia, unspecified: Secondary | ICD-10-CM | POA: Diagnosis not present

## 2018-02-08 DIAGNOSIS — M549 Dorsalgia, unspecified: Secondary | ICD-10-CM | POA: Diagnosis not present

## 2018-02-09 DIAGNOSIS — M549 Dorsalgia, unspecified: Secondary | ICD-10-CM | POA: Diagnosis not present

## 2018-02-10 DIAGNOSIS — M549 Dorsalgia, unspecified: Secondary | ICD-10-CM | POA: Diagnosis not present

## 2018-02-11 DIAGNOSIS — M549 Dorsalgia, unspecified: Secondary | ICD-10-CM | POA: Diagnosis not present

## 2018-02-12 DIAGNOSIS — M549 Dorsalgia, unspecified: Secondary | ICD-10-CM | POA: Diagnosis not present

## 2018-02-15 DIAGNOSIS — M549 Dorsalgia, unspecified: Secondary | ICD-10-CM | POA: Diagnosis not present

## 2018-02-16 DIAGNOSIS — M549 Dorsalgia, unspecified: Secondary | ICD-10-CM | POA: Diagnosis not present

## 2018-02-17 DIAGNOSIS — M549 Dorsalgia, unspecified: Secondary | ICD-10-CM | POA: Diagnosis not present

## 2018-02-18 DIAGNOSIS — M549 Dorsalgia, unspecified: Secondary | ICD-10-CM | POA: Diagnosis not present

## 2018-02-19 DIAGNOSIS — M549 Dorsalgia, unspecified: Secondary | ICD-10-CM | POA: Diagnosis not present

## 2018-02-22 DIAGNOSIS — M549 Dorsalgia, unspecified: Secondary | ICD-10-CM | POA: Diagnosis not present

## 2018-02-23 DIAGNOSIS — M549 Dorsalgia, unspecified: Secondary | ICD-10-CM | POA: Diagnosis not present

## 2018-02-24 DIAGNOSIS — M549 Dorsalgia, unspecified: Secondary | ICD-10-CM | POA: Diagnosis not present

## 2018-02-25 DIAGNOSIS — M549 Dorsalgia, unspecified: Secondary | ICD-10-CM | POA: Diagnosis not present

## 2018-02-26 DIAGNOSIS — M549 Dorsalgia, unspecified: Secondary | ICD-10-CM | POA: Diagnosis not present

## 2018-03-01 DIAGNOSIS — M549 Dorsalgia, unspecified: Secondary | ICD-10-CM | POA: Diagnosis not present

## 2018-03-02 DIAGNOSIS — M549 Dorsalgia, unspecified: Secondary | ICD-10-CM | POA: Diagnosis not present

## 2018-03-03 DIAGNOSIS — M549 Dorsalgia, unspecified: Secondary | ICD-10-CM | POA: Diagnosis not present

## 2018-03-04 DIAGNOSIS — M549 Dorsalgia, unspecified: Secondary | ICD-10-CM | POA: Diagnosis not present

## 2018-03-05 DIAGNOSIS — M549 Dorsalgia, unspecified: Secondary | ICD-10-CM | POA: Diagnosis not present

## 2018-03-08 DIAGNOSIS — M549 Dorsalgia, unspecified: Secondary | ICD-10-CM | POA: Diagnosis not present

## 2018-03-09 DIAGNOSIS — M549 Dorsalgia, unspecified: Secondary | ICD-10-CM | POA: Diagnosis not present

## 2018-03-10 DIAGNOSIS — M549 Dorsalgia, unspecified: Secondary | ICD-10-CM | POA: Diagnosis not present

## 2018-03-11 DIAGNOSIS — M549 Dorsalgia, unspecified: Secondary | ICD-10-CM | POA: Diagnosis not present

## 2018-03-12 DIAGNOSIS — M549 Dorsalgia, unspecified: Secondary | ICD-10-CM | POA: Diagnosis not present

## 2018-03-15 DIAGNOSIS — M549 Dorsalgia, unspecified: Secondary | ICD-10-CM | POA: Diagnosis not present

## 2018-03-16 DIAGNOSIS — M549 Dorsalgia, unspecified: Secondary | ICD-10-CM | POA: Diagnosis not present

## 2018-03-17 DIAGNOSIS — M549 Dorsalgia, unspecified: Secondary | ICD-10-CM | POA: Diagnosis not present

## 2018-03-18 DIAGNOSIS — M549 Dorsalgia, unspecified: Secondary | ICD-10-CM | POA: Diagnosis not present

## 2018-03-19 DIAGNOSIS — M549 Dorsalgia, unspecified: Secondary | ICD-10-CM | POA: Diagnosis not present

## 2018-03-22 DIAGNOSIS — M549 Dorsalgia, unspecified: Secondary | ICD-10-CM | POA: Diagnosis not present

## 2018-03-23 DIAGNOSIS — M549 Dorsalgia, unspecified: Secondary | ICD-10-CM | POA: Diagnosis not present

## 2018-03-24 DIAGNOSIS — M549 Dorsalgia, unspecified: Secondary | ICD-10-CM | POA: Diagnosis not present

## 2018-03-25 DIAGNOSIS — M549 Dorsalgia, unspecified: Secondary | ICD-10-CM | POA: Diagnosis not present

## 2018-03-26 DIAGNOSIS — M549 Dorsalgia, unspecified: Secondary | ICD-10-CM | POA: Diagnosis not present

## 2018-03-29 DIAGNOSIS — M549 Dorsalgia, unspecified: Secondary | ICD-10-CM | POA: Diagnosis not present

## 2018-03-30 DIAGNOSIS — M549 Dorsalgia, unspecified: Secondary | ICD-10-CM | POA: Diagnosis not present

## 2018-03-31 DIAGNOSIS — M549 Dorsalgia, unspecified: Secondary | ICD-10-CM | POA: Diagnosis not present

## 2018-04-01 DIAGNOSIS — M549 Dorsalgia, unspecified: Secondary | ICD-10-CM | POA: Diagnosis not present

## 2018-04-02 DIAGNOSIS — M542 Cervicalgia: Secondary | ICD-10-CM | POA: Insufficient documentation

## 2018-04-02 DIAGNOSIS — M549 Dorsalgia, unspecified: Secondary | ICD-10-CM | POA: Diagnosis not present

## 2018-04-02 DIAGNOSIS — Z6841 Body Mass Index (BMI) 40.0 and over, adult: Secondary | ICD-10-CM | POA: Diagnosis not present

## 2018-04-05 DIAGNOSIS — M549 Dorsalgia, unspecified: Secondary | ICD-10-CM | POA: Diagnosis not present

## 2018-04-06 DIAGNOSIS — M549 Dorsalgia, unspecified: Secondary | ICD-10-CM | POA: Diagnosis not present

## 2018-04-07 DIAGNOSIS — M549 Dorsalgia, unspecified: Secondary | ICD-10-CM | POA: Diagnosis not present

## 2018-04-08 DIAGNOSIS — M549 Dorsalgia, unspecified: Secondary | ICD-10-CM | POA: Diagnosis not present

## 2018-04-09 DIAGNOSIS — M549 Dorsalgia, unspecified: Secondary | ICD-10-CM | POA: Diagnosis not present

## 2018-04-12 DIAGNOSIS — M549 Dorsalgia, unspecified: Secondary | ICD-10-CM | POA: Diagnosis not present

## 2018-04-13 DIAGNOSIS — M549 Dorsalgia, unspecified: Secondary | ICD-10-CM | POA: Diagnosis not present

## 2018-04-14 DIAGNOSIS — M549 Dorsalgia, unspecified: Secondary | ICD-10-CM | POA: Diagnosis not present

## 2018-04-15 DIAGNOSIS — M549 Dorsalgia, unspecified: Secondary | ICD-10-CM | POA: Diagnosis not present

## 2018-04-16 DIAGNOSIS — M549 Dorsalgia, unspecified: Secondary | ICD-10-CM | POA: Diagnosis not present

## 2018-04-19 DIAGNOSIS — M549 Dorsalgia, unspecified: Secondary | ICD-10-CM | POA: Diagnosis not present

## 2018-04-20 DIAGNOSIS — M549 Dorsalgia, unspecified: Secondary | ICD-10-CM | POA: Diagnosis not present

## 2018-04-21 DIAGNOSIS — M549 Dorsalgia, unspecified: Secondary | ICD-10-CM | POA: Diagnosis not present

## 2018-04-22 DIAGNOSIS — M549 Dorsalgia, unspecified: Secondary | ICD-10-CM | POA: Diagnosis not present

## 2018-04-23 DIAGNOSIS — M549 Dorsalgia, unspecified: Secondary | ICD-10-CM | POA: Diagnosis not present

## 2018-04-30 DIAGNOSIS — G4733 Obstructive sleep apnea (adult) (pediatric): Secondary | ICD-10-CM | POA: Diagnosis not present

## 2018-05-03 DIAGNOSIS — M549 Dorsalgia, unspecified: Secondary | ICD-10-CM | POA: Diagnosis not present

## 2018-05-04 DIAGNOSIS — M549 Dorsalgia, unspecified: Secondary | ICD-10-CM | POA: Diagnosis not present

## 2018-05-05 DIAGNOSIS — M549 Dorsalgia, unspecified: Secondary | ICD-10-CM | POA: Diagnosis not present

## 2018-05-06 DIAGNOSIS — M549 Dorsalgia, unspecified: Secondary | ICD-10-CM | POA: Diagnosis not present

## 2018-05-07 DIAGNOSIS — M549 Dorsalgia, unspecified: Secondary | ICD-10-CM | POA: Diagnosis not present

## 2018-05-10 DIAGNOSIS — M549 Dorsalgia, unspecified: Secondary | ICD-10-CM | POA: Diagnosis not present

## 2018-05-11 DIAGNOSIS — M549 Dorsalgia, unspecified: Secondary | ICD-10-CM | POA: Diagnosis not present

## 2018-05-12 DIAGNOSIS — M549 Dorsalgia, unspecified: Secondary | ICD-10-CM | POA: Diagnosis not present

## 2018-05-13 DIAGNOSIS — M549 Dorsalgia, unspecified: Secondary | ICD-10-CM | POA: Diagnosis not present

## 2018-05-14 DIAGNOSIS — M549 Dorsalgia, unspecified: Secondary | ICD-10-CM | POA: Diagnosis not present

## 2018-05-17 DIAGNOSIS — M549 Dorsalgia, unspecified: Secondary | ICD-10-CM | POA: Diagnosis not present

## 2018-05-18 DIAGNOSIS — M549 Dorsalgia, unspecified: Secondary | ICD-10-CM | POA: Diagnosis not present

## 2018-05-19 DIAGNOSIS — M549 Dorsalgia, unspecified: Secondary | ICD-10-CM | POA: Diagnosis not present

## 2018-05-20 DIAGNOSIS — M549 Dorsalgia, unspecified: Secondary | ICD-10-CM | POA: Diagnosis not present

## 2018-05-21 DIAGNOSIS — M549 Dorsalgia, unspecified: Secondary | ICD-10-CM | POA: Diagnosis not present

## 2018-05-24 DIAGNOSIS — M549 Dorsalgia, unspecified: Secondary | ICD-10-CM | POA: Diagnosis not present

## 2018-05-25 DIAGNOSIS — M549 Dorsalgia, unspecified: Secondary | ICD-10-CM | POA: Diagnosis not present

## 2018-05-26 DIAGNOSIS — M549 Dorsalgia, unspecified: Secondary | ICD-10-CM | POA: Diagnosis not present

## 2018-05-27 DIAGNOSIS — M549 Dorsalgia, unspecified: Secondary | ICD-10-CM | POA: Diagnosis not present

## 2018-05-28 DIAGNOSIS — M549 Dorsalgia, unspecified: Secondary | ICD-10-CM | POA: Diagnosis not present

## 2018-05-31 DIAGNOSIS — M549 Dorsalgia, unspecified: Secondary | ICD-10-CM | POA: Diagnosis not present

## 2018-06-01 DIAGNOSIS — M549 Dorsalgia, unspecified: Secondary | ICD-10-CM | POA: Diagnosis not present

## 2018-06-02 DIAGNOSIS — M549 Dorsalgia, unspecified: Secondary | ICD-10-CM | POA: Diagnosis not present

## 2018-06-03 DIAGNOSIS — M549 Dorsalgia, unspecified: Secondary | ICD-10-CM | POA: Diagnosis not present

## 2018-06-04 DIAGNOSIS — M549 Dorsalgia, unspecified: Secondary | ICD-10-CM | POA: Diagnosis not present

## 2018-06-07 DIAGNOSIS — M549 Dorsalgia, unspecified: Secondary | ICD-10-CM | POA: Diagnosis not present

## 2018-06-08 DIAGNOSIS — M549 Dorsalgia, unspecified: Secondary | ICD-10-CM | POA: Diagnosis not present

## 2018-06-09 DIAGNOSIS — M549 Dorsalgia, unspecified: Secondary | ICD-10-CM | POA: Diagnosis not present

## 2018-06-10 DIAGNOSIS — M549 Dorsalgia, unspecified: Secondary | ICD-10-CM | POA: Diagnosis not present

## 2018-06-11 DIAGNOSIS — M549 Dorsalgia, unspecified: Secondary | ICD-10-CM | POA: Diagnosis not present

## 2018-06-14 DIAGNOSIS — M549 Dorsalgia, unspecified: Secondary | ICD-10-CM | POA: Diagnosis not present

## 2018-06-15 DIAGNOSIS — M549 Dorsalgia, unspecified: Secondary | ICD-10-CM | POA: Diagnosis not present

## 2018-06-16 DIAGNOSIS — M549 Dorsalgia, unspecified: Secondary | ICD-10-CM | POA: Diagnosis not present

## 2018-06-17 DIAGNOSIS — M549 Dorsalgia, unspecified: Secondary | ICD-10-CM | POA: Diagnosis not present

## 2018-06-18 DIAGNOSIS — M549 Dorsalgia, unspecified: Secondary | ICD-10-CM | POA: Diagnosis not present

## 2018-06-21 DIAGNOSIS — M549 Dorsalgia, unspecified: Secondary | ICD-10-CM | POA: Diagnosis not present

## 2018-06-22 DIAGNOSIS — M549 Dorsalgia, unspecified: Secondary | ICD-10-CM | POA: Diagnosis not present

## 2018-06-23 DIAGNOSIS — M549 Dorsalgia, unspecified: Secondary | ICD-10-CM | POA: Diagnosis not present

## 2018-06-24 DIAGNOSIS — M549 Dorsalgia, unspecified: Secondary | ICD-10-CM | POA: Diagnosis not present

## 2018-06-25 DIAGNOSIS — M549 Dorsalgia, unspecified: Secondary | ICD-10-CM | POA: Diagnosis not present

## 2018-06-28 DIAGNOSIS — M549 Dorsalgia, unspecified: Secondary | ICD-10-CM | POA: Diagnosis not present

## 2018-06-29 DIAGNOSIS — M549 Dorsalgia, unspecified: Secondary | ICD-10-CM | POA: Diagnosis not present

## 2018-06-30 DIAGNOSIS — M549 Dorsalgia, unspecified: Secondary | ICD-10-CM | POA: Diagnosis not present

## 2018-07-01 DIAGNOSIS — M549 Dorsalgia, unspecified: Secondary | ICD-10-CM | POA: Diagnosis not present

## 2018-07-02 DIAGNOSIS — M549 Dorsalgia, unspecified: Secondary | ICD-10-CM | POA: Diagnosis not present

## 2018-07-05 DIAGNOSIS — M549 Dorsalgia, unspecified: Secondary | ICD-10-CM | POA: Diagnosis not present

## 2018-07-06 DIAGNOSIS — M549 Dorsalgia, unspecified: Secondary | ICD-10-CM | POA: Diagnosis not present

## 2018-07-07 DIAGNOSIS — M549 Dorsalgia, unspecified: Secondary | ICD-10-CM | POA: Diagnosis not present

## 2018-07-08 DIAGNOSIS — M549 Dorsalgia, unspecified: Secondary | ICD-10-CM | POA: Diagnosis not present

## 2018-07-09 DIAGNOSIS — M549 Dorsalgia, unspecified: Secondary | ICD-10-CM | POA: Diagnosis not present

## 2018-07-12 DIAGNOSIS — M549 Dorsalgia, unspecified: Secondary | ICD-10-CM | POA: Diagnosis not present

## 2018-07-13 DIAGNOSIS — M549 Dorsalgia, unspecified: Secondary | ICD-10-CM | POA: Diagnosis not present

## 2018-07-14 DIAGNOSIS — M549 Dorsalgia, unspecified: Secondary | ICD-10-CM | POA: Diagnosis not present

## 2018-07-15 DIAGNOSIS — M549 Dorsalgia, unspecified: Secondary | ICD-10-CM | POA: Diagnosis not present

## 2018-07-16 DIAGNOSIS — M549 Dorsalgia, unspecified: Secondary | ICD-10-CM | POA: Diagnosis not present

## 2018-07-26 DIAGNOSIS — M549 Dorsalgia, unspecified: Secondary | ICD-10-CM | POA: Diagnosis not present

## 2018-07-27 DIAGNOSIS — M549 Dorsalgia, unspecified: Secondary | ICD-10-CM | POA: Diagnosis not present

## 2018-07-28 DIAGNOSIS — M549 Dorsalgia, unspecified: Secondary | ICD-10-CM | POA: Diagnosis not present

## 2018-07-29 DIAGNOSIS — M549 Dorsalgia, unspecified: Secondary | ICD-10-CM | POA: Diagnosis not present

## 2018-07-30 DIAGNOSIS — M549 Dorsalgia, unspecified: Secondary | ICD-10-CM | POA: Diagnosis not present

## 2018-08-02 DIAGNOSIS — M549 Dorsalgia, unspecified: Secondary | ICD-10-CM | POA: Diagnosis not present

## 2018-08-03 DIAGNOSIS — M549 Dorsalgia, unspecified: Secondary | ICD-10-CM | POA: Diagnosis not present

## 2018-08-04 DIAGNOSIS — M549 Dorsalgia, unspecified: Secondary | ICD-10-CM | POA: Diagnosis not present

## 2018-08-05 DIAGNOSIS — M549 Dorsalgia, unspecified: Secondary | ICD-10-CM | POA: Diagnosis not present

## 2018-08-06 DIAGNOSIS — M549 Dorsalgia, unspecified: Secondary | ICD-10-CM | POA: Diagnosis not present

## 2018-08-09 DIAGNOSIS — M549 Dorsalgia, unspecified: Secondary | ICD-10-CM | POA: Diagnosis not present

## 2018-08-10 DIAGNOSIS — M549 Dorsalgia, unspecified: Secondary | ICD-10-CM | POA: Diagnosis not present

## 2018-08-11 DIAGNOSIS — M549 Dorsalgia, unspecified: Secondary | ICD-10-CM | POA: Diagnosis not present

## 2018-08-12 DIAGNOSIS — M549 Dorsalgia, unspecified: Secondary | ICD-10-CM | POA: Diagnosis not present

## 2018-08-13 DIAGNOSIS — M549 Dorsalgia, unspecified: Secondary | ICD-10-CM | POA: Diagnosis not present

## 2018-08-16 DIAGNOSIS — M549 Dorsalgia, unspecified: Secondary | ICD-10-CM | POA: Diagnosis not present

## 2018-08-17 DIAGNOSIS — M549 Dorsalgia, unspecified: Secondary | ICD-10-CM | POA: Diagnosis not present

## 2018-08-18 DIAGNOSIS — M549 Dorsalgia, unspecified: Secondary | ICD-10-CM | POA: Diagnosis not present

## 2018-08-19 DIAGNOSIS — M549 Dorsalgia, unspecified: Secondary | ICD-10-CM | POA: Diagnosis not present

## 2018-08-20 DIAGNOSIS — M549 Dorsalgia, unspecified: Secondary | ICD-10-CM | POA: Diagnosis not present

## 2018-08-23 DIAGNOSIS — M549 Dorsalgia, unspecified: Secondary | ICD-10-CM | POA: Diagnosis not present

## 2018-08-24 DIAGNOSIS — M549 Dorsalgia, unspecified: Secondary | ICD-10-CM | POA: Diagnosis not present

## 2018-08-25 DIAGNOSIS — M549 Dorsalgia, unspecified: Secondary | ICD-10-CM | POA: Diagnosis not present

## 2018-08-26 DIAGNOSIS — M549 Dorsalgia, unspecified: Secondary | ICD-10-CM | POA: Diagnosis not present

## 2018-08-27 DIAGNOSIS — I10 Essential (primary) hypertension: Secondary | ICD-10-CM | POA: Diagnosis not present

## 2018-08-27 DIAGNOSIS — R82998 Other abnormal findings in urine: Secondary | ICD-10-CM | POA: Diagnosis not present

## 2018-08-27 DIAGNOSIS — E7849 Other hyperlipidemia: Secondary | ICD-10-CM | POA: Diagnosis not present

## 2018-08-27 DIAGNOSIS — M549 Dorsalgia, unspecified: Secondary | ICD-10-CM | POA: Diagnosis not present

## 2018-08-27 DIAGNOSIS — E11621 Type 2 diabetes mellitus with foot ulcer: Secondary | ICD-10-CM | POA: Diagnosis not present

## 2018-08-30 DIAGNOSIS — M549 Dorsalgia, unspecified: Secondary | ICD-10-CM | POA: Diagnosis not present

## 2018-08-31 DIAGNOSIS — M549 Dorsalgia, unspecified: Secondary | ICD-10-CM | POA: Diagnosis not present

## 2018-09-01 DIAGNOSIS — M549 Dorsalgia, unspecified: Secondary | ICD-10-CM | POA: Diagnosis not present

## 2018-09-02 DIAGNOSIS — M549 Dorsalgia, unspecified: Secondary | ICD-10-CM | POA: Diagnosis not present

## 2018-09-03 DIAGNOSIS — R5381 Other malaise: Secondary | ICD-10-CM | POA: Diagnosis not present

## 2018-09-03 DIAGNOSIS — D6489 Other specified anemias: Secondary | ICD-10-CM | POA: Diagnosis not present

## 2018-09-03 DIAGNOSIS — I509 Heart failure, unspecified: Secondary | ICD-10-CM | POA: Diagnosis not present

## 2018-09-03 DIAGNOSIS — R945 Abnormal results of liver function studies: Secondary | ICD-10-CM | POA: Insufficient documentation

## 2018-09-03 DIAGNOSIS — M549 Dorsalgia, unspecified: Secondary | ICD-10-CM | POA: Diagnosis not present

## 2018-09-03 DIAGNOSIS — E11621 Type 2 diabetes mellitus with foot ulcer: Secondary | ICD-10-CM | POA: Diagnosis not present

## 2018-09-03 DIAGNOSIS — M2550 Pain in unspecified joint: Secondary | ICD-10-CM | POA: Diagnosis not present

## 2018-09-03 DIAGNOSIS — M069 Rheumatoid arthritis, unspecified: Secondary | ICD-10-CM | POA: Diagnosis not present

## 2018-09-03 DIAGNOSIS — M542 Cervicalgia: Secondary | ICD-10-CM | POA: Diagnosis not present

## 2018-09-03 DIAGNOSIS — Z23 Encounter for immunization: Secondary | ICD-10-CM | POA: Diagnosis not present

## 2018-09-03 DIAGNOSIS — Z0000 Encounter for general adult medical examination without abnormal findings: Secondary | ICD-10-CM | POA: Diagnosis not present

## 2018-09-08 DIAGNOSIS — Z1212 Encounter for screening for malignant neoplasm of rectum: Secondary | ICD-10-CM | POA: Diagnosis not present

## 2018-09-13 DIAGNOSIS — M549 Dorsalgia, unspecified: Secondary | ICD-10-CM | POA: Diagnosis not present

## 2018-09-14 DIAGNOSIS — M549 Dorsalgia, unspecified: Secondary | ICD-10-CM | POA: Diagnosis not present

## 2018-09-15 DIAGNOSIS — M549 Dorsalgia, unspecified: Secondary | ICD-10-CM | POA: Diagnosis not present

## 2018-09-16 DIAGNOSIS — M549 Dorsalgia, unspecified: Secondary | ICD-10-CM | POA: Diagnosis not present

## 2018-09-17 DIAGNOSIS — M549 Dorsalgia, unspecified: Secondary | ICD-10-CM | POA: Diagnosis not present

## 2018-09-20 DIAGNOSIS — M549 Dorsalgia, unspecified: Secondary | ICD-10-CM | POA: Diagnosis not present

## 2018-09-21 DIAGNOSIS — M549 Dorsalgia, unspecified: Secondary | ICD-10-CM | POA: Diagnosis not present

## 2018-09-22 DIAGNOSIS — M549 Dorsalgia, unspecified: Secondary | ICD-10-CM | POA: Diagnosis not present

## 2018-09-23 DIAGNOSIS — M549 Dorsalgia, unspecified: Secondary | ICD-10-CM | POA: Diagnosis not present

## 2018-09-24 DIAGNOSIS — M549 Dorsalgia, unspecified: Secondary | ICD-10-CM | POA: Diagnosis not present

## 2018-09-27 DIAGNOSIS — M549 Dorsalgia, unspecified: Secondary | ICD-10-CM | POA: Diagnosis not present

## 2018-09-28 DIAGNOSIS — M549 Dorsalgia, unspecified: Secondary | ICD-10-CM | POA: Diagnosis not present

## 2018-09-29 DIAGNOSIS — M549 Dorsalgia, unspecified: Secondary | ICD-10-CM | POA: Diagnosis not present

## 2018-09-30 DIAGNOSIS — M549 Dorsalgia, unspecified: Secondary | ICD-10-CM | POA: Diagnosis not present

## 2018-10-01 DIAGNOSIS — M549 Dorsalgia, unspecified: Secondary | ICD-10-CM | POA: Diagnosis not present

## 2018-10-04 DIAGNOSIS — M549 Dorsalgia, unspecified: Secondary | ICD-10-CM | POA: Diagnosis not present

## 2018-10-05 DIAGNOSIS — M549 Dorsalgia, unspecified: Secondary | ICD-10-CM | POA: Diagnosis not present

## 2018-10-06 DIAGNOSIS — M549 Dorsalgia, unspecified: Secondary | ICD-10-CM | POA: Diagnosis not present

## 2018-10-07 DIAGNOSIS — M549 Dorsalgia, unspecified: Secondary | ICD-10-CM | POA: Diagnosis not present

## 2018-10-08 DIAGNOSIS — M549 Dorsalgia, unspecified: Secondary | ICD-10-CM | POA: Diagnosis not present

## 2018-10-11 DIAGNOSIS — M549 Dorsalgia, unspecified: Secondary | ICD-10-CM | POA: Diagnosis not present

## 2018-10-12 DIAGNOSIS — M549 Dorsalgia, unspecified: Secondary | ICD-10-CM | POA: Diagnosis not present

## 2018-10-13 DIAGNOSIS — M549 Dorsalgia, unspecified: Secondary | ICD-10-CM | POA: Diagnosis not present

## 2018-10-14 DIAGNOSIS — Z794 Long term (current) use of insulin: Secondary | ICD-10-CM | POA: Diagnosis not present

## 2018-10-14 DIAGNOSIS — M549 Dorsalgia, unspecified: Secondary | ICD-10-CM | POA: Diagnosis not present

## 2018-10-14 DIAGNOSIS — E1169 Type 2 diabetes mellitus with other specified complication: Secondary | ICD-10-CM | POA: Diagnosis not present

## 2018-10-14 DIAGNOSIS — I509 Heart failure, unspecified: Secondary | ICD-10-CM | POA: Diagnosis not present

## 2018-10-14 DIAGNOSIS — I10 Essential (primary) hypertension: Secondary | ICD-10-CM | POA: Diagnosis not present

## 2018-10-15 DIAGNOSIS — M549 Dorsalgia, unspecified: Secondary | ICD-10-CM | POA: Diagnosis not present

## 2018-10-18 DIAGNOSIS — M549 Dorsalgia, unspecified: Secondary | ICD-10-CM | POA: Diagnosis not present

## 2018-10-19 DIAGNOSIS — M549 Dorsalgia, unspecified: Secondary | ICD-10-CM | POA: Diagnosis not present

## 2018-10-20 DIAGNOSIS — M549 Dorsalgia, unspecified: Secondary | ICD-10-CM | POA: Diagnosis not present

## 2018-10-21 DIAGNOSIS — M549 Dorsalgia, unspecified: Secondary | ICD-10-CM | POA: Diagnosis not present

## 2018-10-22 DIAGNOSIS — M549 Dorsalgia, unspecified: Secondary | ICD-10-CM | POA: Diagnosis not present

## 2018-10-25 DIAGNOSIS — M549 Dorsalgia, unspecified: Secondary | ICD-10-CM | POA: Diagnosis not present

## 2018-10-26 DIAGNOSIS — M549 Dorsalgia, unspecified: Secondary | ICD-10-CM | POA: Diagnosis not present

## 2018-10-27 DIAGNOSIS — M549 Dorsalgia, unspecified: Secondary | ICD-10-CM | POA: Diagnosis not present

## 2018-10-28 DIAGNOSIS — M549 Dorsalgia, unspecified: Secondary | ICD-10-CM | POA: Diagnosis not present

## 2018-10-29 DIAGNOSIS — M549 Dorsalgia, unspecified: Secondary | ICD-10-CM | POA: Diagnosis not present

## 2018-11-01 DIAGNOSIS — M549 Dorsalgia, unspecified: Secondary | ICD-10-CM | POA: Diagnosis not present

## 2018-11-02 DIAGNOSIS — M549 Dorsalgia, unspecified: Secondary | ICD-10-CM | POA: Diagnosis not present

## 2018-11-03 DIAGNOSIS — M549 Dorsalgia, unspecified: Secondary | ICD-10-CM | POA: Diagnosis not present

## 2018-11-04 DIAGNOSIS — M549 Dorsalgia, unspecified: Secondary | ICD-10-CM | POA: Diagnosis not present

## 2018-11-05 DIAGNOSIS — M549 Dorsalgia, unspecified: Secondary | ICD-10-CM | POA: Diagnosis not present

## 2018-11-12 DIAGNOSIS — G4733 Obstructive sleep apnea (adult) (pediatric): Secondary | ICD-10-CM | POA: Diagnosis not present

## 2018-11-23 DIAGNOSIS — E1169 Type 2 diabetes mellitus with other specified complication: Secondary | ICD-10-CM | POA: Diagnosis not present

## 2018-11-23 DIAGNOSIS — Z794 Long term (current) use of insulin: Secondary | ICD-10-CM | POA: Diagnosis not present

## 2018-11-23 DIAGNOSIS — I10 Essential (primary) hypertension: Secondary | ICD-10-CM | POA: Diagnosis not present

## 2018-12-22 DIAGNOSIS — Z794 Long term (current) use of insulin: Secondary | ICD-10-CM | POA: Diagnosis not present

## 2018-12-22 DIAGNOSIS — E1169 Type 2 diabetes mellitus with other specified complication: Secondary | ICD-10-CM | POA: Diagnosis not present

## 2018-12-22 DIAGNOSIS — I509 Heart failure, unspecified: Secondary | ICD-10-CM | POA: Diagnosis not present

## 2018-12-22 DIAGNOSIS — D649 Anemia, unspecified: Secondary | ICD-10-CM | POA: Diagnosis not present

## 2018-12-22 DIAGNOSIS — M549 Dorsalgia, unspecified: Secondary | ICD-10-CM | POA: Diagnosis not present

## 2018-12-22 DIAGNOSIS — G4733 Obstructive sleep apnea (adult) (pediatric): Secondary | ICD-10-CM | POA: Diagnosis not present

## 2018-12-22 DIAGNOSIS — R945 Abnormal results of liver function studies: Secondary | ICD-10-CM | POA: Diagnosis not present

## 2018-12-22 DIAGNOSIS — M542 Cervicalgia: Secondary | ICD-10-CM | POA: Diagnosis not present

## 2019-01-17 DIAGNOSIS — M549 Dorsalgia, unspecified: Secondary | ICD-10-CM | POA: Diagnosis not present

## 2019-01-18 DIAGNOSIS — M549 Dorsalgia, unspecified: Secondary | ICD-10-CM | POA: Diagnosis not present

## 2019-01-19 DIAGNOSIS — M549 Dorsalgia, unspecified: Secondary | ICD-10-CM | POA: Diagnosis not present

## 2019-01-20 DIAGNOSIS — M549 Dorsalgia, unspecified: Secondary | ICD-10-CM | POA: Diagnosis not present

## 2019-01-21 DIAGNOSIS — M549 Dorsalgia, unspecified: Secondary | ICD-10-CM | POA: Diagnosis not present

## 2019-01-24 DIAGNOSIS — M549 Dorsalgia, unspecified: Secondary | ICD-10-CM | POA: Diagnosis not present

## 2019-01-25 DIAGNOSIS — M549 Dorsalgia, unspecified: Secondary | ICD-10-CM | POA: Diagnosis not present

## 2019-01-26 DIAGNOSIS — M549 Dorsalgia, unspecified: Secondary | ICD-10-CM | POA: Diagnosis not present

## 2019-01-27 DIAGNOSIS — M549 Dorsalgia, unspecified: Secondary | ICD-10-CM | POA: Diagnosis not present

## 2019-01-28 DIAGNOSIS — M549 Dorsalgia, unspecified: Secondary | ICD-10-CM | POA: Diagnosis not present

## 2019-02-08 DIAGNOSIS — I10 Essential (primary) hypertension: Secondary | ICD-10-CM | POA: Diagnosis not present

## 2019-02-08 DIAGNOSIS — E1169 Type 2 diabetes mellitus with other specified complication: Secondary | ICD-10-CM | POA: Diagnosis not present

## 2019-02-08 DIAGNOSIS — Z794 Long term (current) use of insulin: Secondary | ICD-10-CM | POA: Diagnosis not present

## 2019-02-14 DIAGNOSIS — M549 Dorsalgia, unspecified: Secondary | ICD-10-CM | POA: Diagnosis not present

## 2019-02-15 DIAGNOSIS — M549 Dorsalgia, unspecified: Secondary | ICD-10-CM | POA: Diagnosis not present

## 2019-02-16 DIAGNOSIS — G4733 Obstructive sleep apnea (adult) (pediatric): Secondary | ICD-10-CM | POA: Diagnosis not present

## 2019-02-16 DIAGNOSIS — M549 Dorsalgia, unspecified: Secondary | ICD-10-CM | POA: Diagnosis not present

## 2019-02-17 DIAGNOSIS — M549 Dorsalgia, unspecified: Secondary | ICD-10-CM | POA: Diagnosis not present

## 2019-02-18 DIAGNOSIS — M549 Dorsalgia, unspecified: Secondary | ICD-10-CM | POA: Diagnosis not present

## 2019-02-21 DIAGNOSIS — M549 Dorsalgia, unspecified: Secondary | ICD-10-CM | POA: Diagnosis not present

## 2019-02-22 DIAGNOSIS — M549 Dorsalgia, unspecified: Secondary | ICD-10-CM | POA: Diagnosis not present

## 2019-02-23 DIAGNOSIS — M549 Dorsalgia, unspecified: Secondary | ICD-10-CM | POA: Diagnosis not present

## 2019-02-24 DIAGNOSIS — M549 Dorsalgia, unspecified: Secondary | ICD-10-CM | POA: Diagnosis not present

## 2019-02-25 DIAGNOSIS — M549 Dorsalgia, unspecified: Secondary | ICD-10-CM | POA: Diagnosis not present

## 2019-04-21 DIAGNOSIS — G4733 Obstructive sleep apnea (adult) (pediatric): Secondary | ICD-10-CM | POA: Diagnosis not present

## 2019-04-21 DIAGNOSIS — E1169 Type 2 diabetes mellitus with other specified complication: Secondary | ICD-10-CM | POA: Diagnosis not present

## 2019-04-21 DIAGNOSIS — Z794 Long term (current) use of insulin: Secondary | ICD-10-CM | POA: Diagnosis not present

## 2019-04-21 DIAGNOSIS — I509 Heart failure, unspecified: Secondary | ICD-10-CM | POA: Diagnosis not present

## 2019-04-21 DIAGNOSIS — J45909 Unspecified asthma, uncomplicated: Secondary | ICD-10-CM | POA: Diagnosis not present

## 2019-04-21 DIAGNOSIS — M542 Cervicalgia: Secondary | ICD-10-CM | POA: Diagnosis not present

## 2019-04-21 DIAGNOSIS — R945 Abnormal results of liver function studies: Secondary | ICD-10-CM | POA: Diagnosis not present

## 2019-04-21 DIAGNOSIS — D649 Anemia, unspecified: Secondary | ICD-10-CM | POA: Diagnosis not present

## 2019-04-29 DIAGNOSIS — I10 Essential (primary) hypertension: Secondary | ICD-10-CM | POA: Diagnosis not present

## 2019-04-29 DIAGNOSIS — E1169 Type 2 diabetes mellitus with other specified complication: Secondary | ICD-10-CM | POA: Diagnosis not present

## 2019-04-29 DIAGNOSIS — Z23 Encounter for immunization: Secondary | ICD-10-CM | POA: Diagnosis not present

## 2019-05-11 DIAGNOSIS — E1169 Type 2 diabetes mellitus with other specified complication: Secondary | ICD-10-CM | POA: Diagnosis not present

## 2019-05-11 DIAGNOSIS — I10 Essential (primary) hypertension: Secondary | ICD-10-CM | POA: Diagnosis not present

## 2019-05-11 DIAGNOSIS — I509 Heart failure, unspecified: Secondary | ICD-10-CM | POA: Diagnosis not present

## 2019-05-11 DIAGNOSIS — Z794 Long term (current) use of insulin: Secondary | ICD-10-CM | POA: Diagnosis not present

## 2019-05-17 DIAGNOSIS — M25522 Pain in left elbow: Secondary | ICD-10-CM | POA: Diagnosis not present

## 2019-05-17 DIAGNOSIS — M25532 Pain in left wrist: Secondary | ICD-10-CM | POA: Diagnosis not present

## 2019-06-15 DIAGNOSIS — M79602 Pain in left arm: Secondary | ICD-10-CM | POA: Diagnosis not present

## 2019-06-15 DIAGNOSIS — R200 Anesthesia of skin: Secondary | ICD-10-CM | POA: Diagnosis not present

## 2019-06-21 DIAGNOSIS — M25522 Pain in left elbow: Secondary | ICD-10-CM | POA: Diagnosis not present

## 2019-07-13 DIAGNOSIS — R05 Cough: Secondary | ICD-10-CM | POA: Diagnosis not present

## 2019-07-13 DIAGNOSIS — J069 Acute upper respiratory infection, unspecified: Secondary | ICD-10-CM | POA: Diagnosis not present

## 2019-07-13 DIAGNOSIS — Z20818 Contact with and (suspected) exposure to other bacterial communicable diseases: Secondary | ICD-10-CM | POA: Diagnosis not present

## 2019-07-13 DIAGNOSIS — I10 Essential (primary) hypertension: Secondary | ICD-10-CM | POA: Diagnosis not present

## 2019-08-16 DIAGNOSIS — I10 Essential (primary) hypertension: Secondary | ICD-10-CM | POA: Diagnosis not present

## 2019-08-16 DIAGNOSIS — Z794 Long term (current) use of insulin: Secondary | ICD-10-CM | POA: Diagnosis not present

## 2019-08-16 DIAGNOSIS — E1169 Type 2 diabetes mellitus with other specified complication: Secondary | ICD-10-CM | POA: Diagnosis not present

## 2019-08-22 DIAGNOSIS — E1169 Type 2 diabetes mellitus with other specified complication: Secondary | ICD-10-CM | POA: Diagnosis not present

## 2019-08-31 DIAGNOSIS — M25562 Pain in left knee: Secondary | ICD-10-CM | POA: Diagnosis not present

## 2019-08-31 DIAGNOSIS — M549 Dorsalgia, unspecified: Secondary | ICD-10-CM | POA: Diagnosis not present

## 2019-08-31 DIAGNOSIS — W19XXXA Unspecified fall, initial encounter: Secondary | ICD-10-CM | POA: Diagnosis not present

## 2019-09-01 DIAGNOSIS — M25562 Pain in left knee: Secondary | ICD-10-CM | POA: Diagnosis not present

## 2019-09-01 DIAGNOSIS — M25561 Pain in right knee: Secondary | ICD-10-CM | POA: Diagnosis not present

## 2019-09-01 DIAGNOSIS — M79604 Pain in right leg: Secondary | ICD-10-CM | POA: Diagnosis not present

## 2019-09-01 DIAGNOSIS — M1712 Unilateral primary osteoarthritis, left knee: Secondary | ICD-10-CM | POA: Diagnosis not present

## 2019-09-21 DIAGNOSIS — M25562 Pain in left knee: Secondary | ICD-10-CM | POA: Diagnosis not present

## 2019-09-21 DIAGNOSIS — M545 Low back pain: Secondary | ICD-10-CM | POA: Diagnosis not present

## 2019-09-21 DIAGNOSIS — M25561 Pain in right knee: Secondary | ICD-10-CM | POA: Diagnosis not present

## 2019-09-27 DIAGNOSIS — Z0000 Encounter for general adult medical examination without abnormal findings: Secondary | ICD-10-CM | POA: Diagnosis not present

## 2019-09-27 DIAGNOSIS — E7849 Other hyperlipidemia: Secondary | ICD-10-CM | POA: Diagnosis not present

## 2019-09-27 DIAGNOSIS — E1169 Type 2 diabetes mellitus with other specified complication: Secondary | ICD-10-CM | POA: Diagnosis not present

## 2019-09-28 DIAGNOSIS — M545 Low back pain: Secondary | ICD-10-CM | POA: Diagnosis not present

## 2019-09-28 DIAGNOSIS — M25562 Pain in left knee: Secondary | ICD-10-CM | POA: Diagnosis not present

## 2019-09-28 DIAGNOSIS — M25561 Pain in right knee: Secondary | ICD-10-CM | POA: Diagnosis not present

## 2019-09-29 DIAGNOSIS — Z1339 Encounter for screening examination for other mental health and behavioral disorders: Secondary | ICD-10-CM | POA: Diagnosis not present

## 2019-09-29 DIAGNOSIS — M2550 Pain in unspecified joint: Secondary | ICD-10-CM | POA: Diagnosis not present

## 2019-09-29 DIAGNOSIS — M79632 Pain in left forearm: Secondary | ICD-10-CM | POA: Diagnosis not present

## 2019-09-29 DIAGNOSIS — I10 Essential (primary) hypertension: Secondary | ICD-10-CM | POA: Diagnosis not present

## 2019-09-29 DIAGNOSIS — Z0000 Encounter for general adult medical examination without abnormal findings: Secondary | ICD-10-CM | POA: Diagnosis not present

## 2019-09-29 DIAGNOSIS — Z1331 Encounter for screening for depression: Secondary | ICD-10-CM | POA: Diagnosis not present

## 2019-09-29 DIAGNOSIS — M25562 Pain in left knee: Secondary | ICD-10-CM | POA: Diagnosis not present

## 2019-09-29 DIAGNOSIS — M542 Cervicalgia: Secondary | ICD-10-CM | POA: Diagnosis not present

## 2019-09-29 DIAGNOSIS — E1169 Type 2 diabetes mellitus with other specified complication: Secondary | ICD-10-CM | POA: Diagnosis not present

## 2019-09-29 DIAGNOSIS — Z794 Long term (current) use of insulin: Secondary | ICD-10-CM | POA: Diagnosis not present

## 2019-09-29 DIAGNOSIS — R945 Abnormal results of liver function studies: Secondary | ICD-10-CM | POA: Diagnosis not present

## 2019-09-30 DIAGNOSIS — M25561 Pain in right knee: Secondary | ICD-10-CM | POA: Diagnosis not present

## 2019-09-30 DIAGNOSIS — M545 Low back pain: Secondary | ICD-10-CM | POA: Diagnosis not present

## 2019-09-30 DIAGNOSIS — M25562 Pain in left knee: Secondary | ICD-10-CM | POA: Diagnosis not present

## 2019-10-05 DIAGNOSIS — M545 Low back pain: Secondary | ICD-10-CM | POA: Diagnosis not present

## 2019-10-05 DIAGNOSIS — M25561 Pain in right knee: Secondary | ICD-10-CM | POA: Diagnosis not present

## 2019-10-05 DIAGNOSIS — M25562 Pain in left knee: Secondary | ICD-10-CM | POA: Diagnosis not present

## 2019-10-10 DIAGNOSIS — M25562 Pain in left knee: Secondary | ICD-10-CM | POA: Diagnosis not present

## 2019-10-10 DIAGNOSIS — M25561 Pain in right knee: Secondary | ICD-10-CM | POA: Diagnosis not present

## 2019-10-10 DIAGNOSIS — M545 Low back pain: Secondary | ICD-10-CM | POA: Diagnosis not present

## 2019-10-13 DIAGNOSIS — M25562 Pain in left knee: Secondary | ICD-10-CM | POA: Diagnosis not present

## 2019-10-13 DIAGNOSIS — M25561 Pain in right knee: Secondary | ICD-10-CM | POA: Diagnosis not present

## 2019-10-13 DIAGNOSIS — M545 Low back pain: Secondary | ICD-10-CM | POA: Diagnosis not present

## 2019-10-18 DIAGNOSIS — M25561 Pain in right knee: Secondary | ICD-10-CM | POA: Diagnosis not present

## 2019-10-18 DIAGNOSIS — M545 Low back pain: Secondary | ICD-10-CM | POA: Diagnosis not present

## 2019-10-18 DIAGNOSIS — M25562 Pain in left knee: Secondary | ICD-10-CM | POA: Diagnosis not present

## 2019-10-25 ENCOUNTER — Encounter: Payer: Self-pay | Admitting: Neurology

## 2019-10-25 ENCOUNTER — Institutional Professional Consult (permissible substitution): Payer: Medicare HMO | Admitting: Neurology

## 2019-10-25 ENCOUNTER — Telehealth: Payer: Self-pay | Admitting: Neurology

## 2019-10-25 NOTE — Telephone Encounter (Signed)
Pt was a no show to the sleep consult apt for today

## 2019-11-29 ENCOUNTER — Encounter (HOSPITAL_COMMUNITY): Payer: Self-pay

## 2019-11-29 ENCOUNTER — Ambulatory Visit (HOSPITAL_COMMUNITY)
Admission: EM | Admit: 2019-11-29 | Discharge: 2019-11-29 | Disposition: A | Discharge status: Home / Self Care | Payer: Medicare HMO | Attending: Family Medicine | Admitting: Family Medicine

## 2019-11-29 ENCOUNTER — Other Ambulatory Visit: Payer: Self-pay

## 2019-11-29 ENCOUNTER — Ambulatory Visit (INDEPENDENT_AMBULATORY_CARE_PROVIDER_SITE_OTHER): Payer: Medicare HMO

## 2019-11-29 DIAGNOSIS — Z880 Allergy status to penicillin: Secondary | ICD-10-CM | POA: Insufficient documentation

## 2019-11-29 DIAGNOSIS — Z8249 Family history of ischemic heart disease and other diseases of the circulatory system: Secondary | ICD-10-CM | POA: Diagnosis not present

## 2019-11-29 DIAGNOSIS — I110 Hypertensive heart disease with heart failure: Secondary | ICD-10-CM | POA: Diagnosis not present

## 2019-11-29 DIAGNOSIS — E785 Hyperlipidemia, unspecified: Secondary | ICD-10-CM | POA: Diagnosis not present

## 2019-11-29 DIAGNOSIS — Z7982 Long term (current) use of aspirin: Secondary | ICD-10-CM | POA: Diagnosis not present

## 2019-11-29 DIAGNOSIS — R600 Localized edema: Secondary | ICD-10-CM | POA: Diagnosis not present

## 2019-11-29 DIAGNOSIS — Z6841 Body Mass Index (BMI) 40.0 and over, adult: Secondary | ICD-10-CM | POA: Insufficient documentation

## 2019-11-29 DIAGNOSIS — J45909 Unspecified asthma, uncomplicated: Secondary | ICD-10-CM | POA: Insufficient documentation

## 2019-11-29 DIAGNOSIS — Z881 Allergy status to other antibiotic agents status: Secondary | ICD-10-CM | POA: Diagnosis not present

## 2019-11-29 DIAGNOSIS — R0602 Shortness of breath: Secondary | ICD-10-CM | POA: Diagnosis not present

## 2019-11-29 DIAGNOSIS — Z794 Long term (current) use of insulin: Secondary | ICD-10-CM | POA: Diagnosis not present

## 2019-11-29 DIAGNOSIS — I509 Heart failure, unspecified: Secondary | ICD-10-CM | POA: Diagnosis not present

## 2019-11-29 DIAGNOSIS — E119 Type 2 diabetes mellitus without complications: Secondary | ICD-10-CM | POA: Insufficient documentation

## 2019-11-29 DIAGNOSIS — M791 Myalgia, unspecified site: Secondary | ICD-10-CM | POA: Diagnosis not present

## 2019-11-29 DIAGNOSIS — Z79899 Other long term (current) drug therapy: Secondary | ICD-10-CM | POA: Insufficient documentation

## 2019-11-29 DIAGNOSIS — G4733 Obstructive sleep apnea (adult) (pediatric): Secondary | ICD-10-CM | POA: Insufficient documentation

## 2019-11-29 DIAGNOSIS — K219 Gastro-esophageal reflux disease without esophagitis: Secondary | ICD-10-CM | POA: Insufficient documentation

## 2019-11-29 DIAGNOSIS — Z20822 Contact with and (suspected) exposure to covid-19: Secondary | ICD-10-CM | POA: Insufficient documentation

## 2019-11-29 DIAGNOSIS — E669 Obesity, unspecified: Secondary | ICD-10-CM | POA: Insufficient documentation

## 2019-11-29 DIAGNOSIS — Z833 Family history of diabetes mellitus: Secondary | ICD-10-CM | POA: Diagnosis not present

## 2019-11-29 DIAGNOSIS — R5383 Other fatigue: Secondary | ICD-10-CM

## 2019-11-29 LAB — CBC
HCT: 40.7 % (ref 36.0–46.0)
Hemoglobin: 12.5 g/dL (ref 12.0–15.0)
MCH: 25.6 pg — ABNORMAL LOW (ref 26.0–34.0)
MCHC: 30.7 g/dL (ref 30.0–36.0)
MCV: 83.4 fL (ref 80.0–100.0)
Platelets: 288 10*3/uL (ref 150–400)
RBC: 4.88 MIL/uL (ref 3.87–5.11)
RDW: 15.4 % (ref 11.5–15.5)
WBC: 8.9 10*3/uL (ref 4.0–10.5)
nRBC: 0 % (ref 0.0–0.2)

## 2019-11-29 LAB — BASIC METABOLIC PANEL
Anion gap: 9 (ref 5–15)
BUN: 9 mg/dL (ref 6–20)
CO2: 27 mmol/L (ref 22–32)
Calcium: 9 mg/dL (ref 8.9–10.3)
Chloride: 99 mmol/L (ref 98–111)
Creatinine, Ser: 0.72 mg/dL (ref 0.44–1.00)
GFR calc Af Amer: >60 mL/min (ref >60)
GFR calc non Af Amer: >60 mL/min (ref >60)
Glucose, Bld: 72 mg/dL (ref 70–99)
Potassium: 3.7 mmol/L (ref 3.5–5.1)
Sodium: 135 mmol/L (ref 135–145)

## 2019-11-29 LAB — TSH: TSH: 3.354 u[IU]/mL (ref 0.350–4.500)

## 2019-11-29 NOTE — Discharge Instructions (Addendum)
Continue your lasix at your current dose  We have sent lab work and will notify you of results requiring attention - take 1 additional dose of your lasix daily for 3 days and elevate you feet. Minimize salt intake  Please schedule follow up with your primary care for Sleep apnea machine evaluation  If you have chest pain, shortness of breath please go to the emergency department

## 2019-11-29 NOTE — ED Provider Notes (Signed)
[facility]    CSN: [id number] Arrival date & time: 11/29/19  1409      History   Chief Complaint Chief Complaint  Patient presents with  . Fatigue    HPI EVERETT RICCIARDELLI is a 59 y.o. female.   Patient with history of CHF, diabetes and OSA reports for evaluation of body ache, fatigue and chills.  She reports the symptoms have been present on and off since she received a Covid vaccine on 11/18/2019.  She received the first vaccine on Monday.  She is also noted some increased lower extremity swelling and swelling in her upper extremities.  She reports that 3 to 4 days ago with the fatigue increased.  She reports her body aches are in her legs and arms.  She attributes all these symptoms to the COVID-19 vaccine.  She does report she has lower extremity swelling at baseline and is on daily Lasix therapy for this.  However she believes she has had more swelling lately since the vaccine than usual.  She denies any shortness of breath or chest pain.  Denies cough, congestion, nausea, vomiting, diarrhea.  She reports she has been compliant with all of her blood pressure medications and diuretics.  She reports she utilizes her sleep apnea machine every night.  She reports it has been 2 years since she was started on sleep apnea machine has not seen someone for it since then.  She does report she has good primary care follow-up.  She believes she had blood work done about 3 months ago.  She is uncertain of her most recent weights and is not rare self regulate.   And obstructive sleep apnea Past Medical History:  Diagnosis Date  . Allergy   . Arthritis   . Asthma   . CHF (congestive heart failure) ([place]) 2008  . Diabetes mellitus   . Edema extremities   . GERD (gastroesophageal reflux disease)   . Hyperlipidemia   . Hypertension   . Neuromuscular disorder (H[facility])    neuropathy  . Obesity   . OSA (obstructive sleep apnea) 10/13/2016   Severe with AHI 44.9/hr now on CPAP at 11cm  H2O  . Sleep apnea    wears cpap     Patient Active Problem List   Diagnosis Date Noted  . OSA (obstructive sleep apnea) 10/13/2016  . Excessive daytime sleepiness 06/04/2016  . Gasping for breath 06/04/2016  . DM, UNCOMPLICATED, TYPE II, UNCONTROLLED 02/12/2007  . HYPERCHOLESTEROLEMIA, PURE 02/12/2007  . Obesity 02/12/2007  . HYPERTENSION, BENIGN ESSENTIAL, CONTROLLED 02/12/2007  . ALLERGIC RHINITIS 02/12/2007  . ASTHMA, CHILDHOOD 02/12/2007  . DEGENERATIVE JOINT DISEASE 02/12/2007    Past Surgical History:  Procedure Laterality Date  . ABDOMINAL HYSTERECTOMY    . EYE SURGERY      OB History   No obstetric history on file.      Home Medications    Prior to Admission medications   Medication Sig Start Date End Date Taking? Authorizing Provider  acetaminophen (TYLENOL) 500 MG tablet Take 1 tablet (500 mg total) by mouth every 6 (six) hours as needed. 05/04/15   Dowless, Aldona Bar Tripp, PA-C  albuterol (PROVENTIL HFA;VENTOLIN HFA) 108 (90 BASE) MCG/ACT inhaler Inhale 2 puffs into the lungs every 6 (six) hours as needed. For wheezing    [provider]  amLODipine (NORVASC) 5 MG tablet Take 5 mg by mouth daily.    [provider]  aspirin EC 81 MG tablet Take 81 mg by mouth daily.  [provider]  atorvastatin (LIPITOR) 80 MG tablet Take 80 mg by mouth every morning.  04/25/15   [provider]  azithromycin (ZITHROMAX) 250 MG tablet Take first 2 tablets together, then 1 every day until finished. 01/03/16   Tharon Aquas, PA  Cholecalciferol 50000 units capsule Take 50,000 Units by mouth every Thursday.     [provider]  cloNIDine (CATAPRES) 0.1 MG tablet Take 1 tablet (0.1 mg total) by mouth 2 (two) times daily. 03/08/16   Blane Ohara, MD  clotrimazole (LOTRIMIN) 1 % cream Apply 1 application topically daily as needed (for rash).    [provider]  clotrimazole-betamethasone (LOTRISONE) cream Apply 1 application  topically 2 (two) times daily as needed (for rash).    [provider]  esomeprazole (NEXIUM) 40 MG capsule Take 40 mg by mouth 2 (two) times daily before a meal.  04/25/15   [provider]  fluticasone (CUTIVATE) 0.05 % cream Apply topically 2 (two) times daily. 04/23/15   Linna Hoff, MD  fluticasone (FLONASE) 50 MCG/ACT nasal spray Place 1 spray into both nostrils 2 (two) times daily. 05/13/16   [provider]  furosemide (LASIX) 20 MG tablet Take 20 mg by mouth daily.    [provider]  glimepiride (AMARYL) 4 MG tablet Take 4 mg by mouth daily with breakfast.  04/25/15   [provider]  HYDROcodone-acetaminophen (NORCO/VICODIN) 5-325 MG tablet Take 1 tablet by mouth as directed. 05/22/16   [provider]  hydrOXYzine (ATARAX/VISTARIL) 25 MG tablet Take 1 tablet (25 mg total) by mouth every 6 (six) hours. Prn itching 04/23/15   Linna Hoff, MD  insulin detemir (LEVEMIR) 100 UNIT/ML injection Inject 80 Units into the skin 2 (two) times daily.     [provider]  lisinopril-hydrochlorothiazide (PRINZIDE,ZESTORETIC) 20-25 MG tablet Take 1 tablet by mouth daily. 05/13/16   [provider]  loratadine (CLARITIN) 10 MG tablet Take 10 mg by mouth daily.    [provider]  methocarbamol (ROBAXIN) 500 MG tablet Take 1 tablet by mouth daily. 05/21/16   [provider]  mupirocin ointment (BACTROBAN) 2 %  11/19/17   [provider]  nebivolol (BYSTOLIC) 10 MG tablet Take 10 mg by mouth daily.    [provider]  ondansetron (ZOFRAN) 4 MG tablet Take 1 tablet (4 mg total) by mouth every 6 (six) hours. 05/04/15   Dowless, Lelon Mast Tripp, PA-C  potassium chloride (K-DUR) 10 MEQ tablet Take 10 mEq by mouth daily.     [provider]  predniSONE (DELTASONE) 10 MG tablet Sig: 4 tables once a day for 3 days, 3 tablets once a day X3 days, 2 tablets a day for 3 days, 1 tablet a day for 3 days. 01/03/16    Tharon Aquas, PA  terbinafine (LAMISIL) 250 MG tablet Take 1 tablet (250 mg total) by mouth daily. 11/30/17   Lenn Sink, DPM  TRULICITY 1.5 MG/0.5ML SOPN Inject 1.5 mg as directed every Wednesday.  05/26/16   [provider]    Family History Family History  Problem Relation Age of Onset  . Stroke Mother   . Hypertension Father   . Arthritis Father   . Gout Father   . Hypertension Sister   . Diabetes Sister   . Heart attack Sister   . Diabetes Brother   . Stroke Brother   . Colon cancer Neg Hx   . Colon polyps Neg Hx   .  Rectal cancer Neg Hx   . Stomach cancer Neg Hx     Social History Social History   Tobacco Use  . Smoking status: Never Smoker  . Smokeless tobacco: Never Used  Substance Use Topics  . Alcohol use: No  . Drug use: No     Allergies   Shrimp [shellfish allergy], Tetracycline, and Penicillins   Review of Systems Review of Systems See HPI  Physical Exam Triage Vital Signs ED Triage Vitals  Enc Vitals Group     BP 11/29/19 1454 (!) 177/89     Pulse Rate 11/29/19 1454 82     Resp 11/29/19 1454 18     Temp 11/29/19 1454 98.3 F (36.8 C)     Temp Source 11/29/19 1454 Oral     SpO2 11/29/19 1454 98 %     Weight 11/29/19 1455 210 lb (95.3 kg)     Height --      Head Circumference --      Peak Flow --      Pain Score 11/29/19 1455 5     Pain Loc --      Pain Edu? --      Excl. in GC? --    No data found.  Updated Vital Signs BP (!) 177/89 (BP Location: Right Arm)   Pulse 82   Temp 98.3 F (36.8 C) (Oral)   Resp 18   Wt (!) 325 lb (147.4 kg)   SpO2 98%   BMI 57.57 kg/m   Visual Acuity Right Eye Distance:   Left Eye Distance:   Bilateral Distance:    Right Eye Near:   Left Eye Near:    Bilateral Near:     Physical Exam Vitals and nursing note reviewed.  Constitutional:      General: She is not in acute distress.    Appearance: She is well-developed. She is obese. She is not ill-appearing.  HENT:     Head:  Normocephalic and atraumatic.     Nose: Nose normal.     Mouth/Throat:     Pharynx: Oropharynx is clear.  Eyes:     Extraocular Movements: Extraocular movements intact.     Conjunctiva/sclera: Conjunctivae normal.     Pupils: Pupils are equal, round, and reactive to light.  Cardiovascular:     Rate and Rhythm: Normal rate and regular rhythm.     Heart sounds: No murmur.  Pulmonary:     Effort: Pulmonary effort is normal. No respiratory distress.     Breath sounds: Normal breath sounds. No wheezing, rhonchi or rales.     Comments: Speaking in full sentences Musculoskeletal:     Cervical back: Neck supple.     Comments: 1-2+ pitting edema to above the ankle.  Difficult to discern edema above this due to body habitus.  Minimal tenderness and no erythema.  There is no edema in the upper extremities.  Skin:    General: Skin is warm and dry.     Findings: No rash.  Neurological:     General: No focal deficit present.     Mental Status: She is alert and oriented to person, place, and time.      UC Treatments / Results  Labs (all labs ordered are listed, but only abnormal results are displayed) Labs Reviewed  CBC - Abnormal; Notable for the following components:      Result Value   MCH 25.6 (*)    All other components within normal limits  SARS CORONAVIRUS 2 (TAT  6-24 HRS)  BASIC METABOLIC PANEL  TSH  VITAMIN D 25 HYDROXY (VIT D DEFICIENCY, FRACTURES)    EKG Normal sinus rhythm with low voltage.  No evidence of ST elevation or T wave abnormalities.  Low voltage is likely due to body habitus as opposed to acute pathology.  Otherwise normal EKG.  Radiology DG Chest 2 View  Result Date: 11/29/2019 CLINICAL DATA:  CHF patient with lower extremity edema and fatigue, shortness of breath EXAM: CHEST - 2 VIEW COMPARISON:  Radiograph 10/17/2016 FINDINGS: Some increased attenuation towards the lung bases is likely secondary to body habitus. No convincing features of frank edema or  vascular congestion. No consolidative opacity. No pneumothorax or effusion. Cardiomegaly is similar to comparison studies. Remaining cardiomediastinal contours are unremarkable. No acute osseous or soft tissue abnormality. Degenerative changes are present in the imaged spine and shoulders. IMPRESSION: 1. Mild stable cardiomegaly without edema or pleural effusion. 2. No acute cardiopulmonary abnormality. Electronically Signed   By: Kreg Shropshire M.D.   On: 11/29/2019 16:30    Procedures Procedures (including critical care time)  Medications Ordered in UC Medications - No data to display  Initial Impression / Assessment and Plan / UC Course  I have reviewed the triage vital signs and the nursing notes.  Pertinent labs & imaging results that were available during my care of the patient were reviewed by me and considered in my medical decision making (see chart for details).     #Fatigue #Myalgia #Bilateral lower extremity edema Patient is a 59 year old female presenting with fatigue and body ache.  EKG with low voltage likely due to body habitus, chest x-ray without evidence of pulmonary edema.  Unlikely this is an acute abdomen exacerbation at this point.  CBC and BMP within normal limits.  TSH also within normal limits.  Awaiting vitamin D.  Covid PCR was sent.  Discussed with patient that I cannot completely attribute her symptoms to the initial dose of vaccine that she received roughly 2 weeks ago.  We discussed that she needs to be refitted for sleep apnea and that she should follow-up with her primary care to further discuss this.  Discussed that she has worsening symptoms such as chest pain or shortness of breath that she should report immediately to emergency department. -Discussed stopping estrogen laces for 10 mg doses daily in the next 3 days. -Patient verbalized understanding and agrees with the plan.  Final Clinical Impressions(s) / UC Diagnoses   Final diagnoses:  Fatigue,  unspecified type  Myalgia  Bilateral lower extremity edema     Discharge Instructions     Continue your lasix at your current dose  We have sent lab work and will notify you of results requiring attention - take 1 additional dose of your lasix daily for 3 days and elevate you feet. Minimize salt intake  Please schedule follow up with your primary care for Sleep apnea machine evaluation  If you have chest pain, shortness of breath please go to the emergency department      ED Prescriptions    None     PDMP not reviewed this encounter.   Hermelinda Medicus, PA-C 11/29/19 2358

## 2019-11-30 LAB — VITAMIN D 25 HYDROXY (VIT D DEFICIENCY, FRACTURES): Vit D, 25-Hydroxy: 25.19 ng/mL — ABNORMAL LOW (ref 30–100)

## 2019-11-30 LAB — SARS CORONAVIRUS 2 (TAT 6-24 HRS): SARS Coronavirus 2: NEGATIVE

## 2019-12-06 ENCOUNTER — Telehealth (HOSPITAL_COMMUNITY): Payer: Self-pay | Admitting: Physician Assistant

## 2019-12-06 DIAGNOSIS — E559 Vitamin D deficiency, unspecified: Secondary | ICD-10-CM

## 2019-12-06 NOTE — Telephone Encounter (Cosign Needed)
Called to discuss Vitamin D lab at 25. Patient reports taking a daily multivitamin. We discussed that between the multi-vitamin and getting some sunshine, she would likely gain some vitamin D. Instructed to follow up with her PCP for a re-check after continued multi and sunshine. Patient verbalizes agreement and states she is generally feeling well since last visit.

## 2019-12-28 DIAGNOSIS — I10 Essential (primary) hypertension: Secondary | ICD-10-CM | POA: Diagnosis not present

## 2019-12-28 DIAGNOSIS — G4733 Obstructive sleep apnea (adult) (pediatric): Secondary | ICD-10-CM | POA: Diagnosis not present

## 2019-12-28 DIAGNOSIS — Z794 Long term (current) use of insulin: Secondary | ICD-10-CM | POA: Diagnosis not present

## 2019-12-28 DIAGNOSIS — I509 Heart failure, unspecified: Secondary | ICD-10-CM | POA: Diagnosis not present

## 2019-12-28 DIAGNOSIS — M069 Rheumatoid arthritis, unspecified: Secondary | ICD-10-CM | POA: Diagnosis not present

## 2019-12-28 DIAGNOSIS — E1169 Type 2 diabetes mellitus with other specified complication: Secondary | ICD-10-CM | POA: Diagnosis not present

## 2019-12-28 DIAGNOSIS — Z79899 Other long term (current) drug therapy: Secondary | ICD-10-CM | POA: Diagnosis not present

## 2019-12-28 DIAGNOSIS — K219 Gastro-esophageal reflux disease without esophagitis: Secondary | ICD-10-CM | POA: Diagnosis not present

## 2020-01-02 DIAGNOSIS — M1812 Unilateral primary osteoarthritis of first carpometacarpal joint, left hand: Secondary | ICD-10-CM | POA: Diagnosis not present

## 2020-01-02 DIAGNOSIS — M79671 Pain in right foot: Secondary | ICD-10-CM | POA: Diagnosis not present

## 2020-01-02 DIAGNOSIS — M25561 Pain in right knee: Secondary | ICD-10-CM | POA: Diagnosis not present

## 2020-01-02 DIAGNOSIS — M791 Myalgia, unspecified site: Secondary | ICD-10-CM | POA: Diagnosis not present

## 2020-01-02 DIAGNOSIS — I509 Heart failure, unspecified: Secondary | ICD-10-CM | POA: Diagnosis not present

## 2020-01-02 DIAGNOSIS — M1711 Unilateral primary osteoarthritis, right knee: Secondary | ICD-10-CM | POA: Diagnosis not present

## 2020-01-02 DIAGNOSIS — Z79899 Other long term (current) drug therapy: Secondary | ICD-10-CM | POA: Diagnosis not present

## 2020-01-02 DIAGNOSIS — M1811 Unilateral primary osteoarthritis of first carpometacarpal joint, right hand: Secondary | ICD-10-CM | POA: Diagnosis not present

## 2020-01-02 DIAGNOSIS — M25562 Pain in left knee: Secondary | ICD-10-CM | POA: Diagnosis not present

## 2020-01-02 DIAGNOSIS — M25572 Pain in left ankle and joints of left foot: Secondary | ICD-10-CM | POA: Diagnosis not present

## 2020-01-02 DIAGNOSIS — M170 Bilateral primary osteoarthritis of knee: Secondary | ICD-10-CM | POA: Diagnosis not present

## 2020-01-02 DIAGNOSIS — R700 Elevated erythrocyte sedimentation rate: Secondary | ICD-10-CM | POA: Diagnosis not present

## 2020-01-02 DIAGNOSIS — M2550 Pain in unspecified joint: Secondary | ICD-10-CM | POA: Diagnosis not present

## 2020-01-02 DIAGNOSIS — M0579 Rheumatoid arthritis with rheumatoid factor of multiple sites without organ or systems involvement: Secondary | ICD-10-CM | POA: Diagnosis not present

## 2020-01-02 DIAGNOSIS — M1712 Unilateral primary osteoarthritis, left knee: Secondary | ICD-10-CM | POA: Diagnosis not present

## 2020-01-02 DIAGNOSIS — M79642 Pain in left hand: Secondary | ICD-10-CM | POA: Diagnosis not present

## 2020-01-02 DIAGNOSIS — M25571 Pain in right ankle and joints of right foot: Secondary | ICD-10-CM | POA: Diagnosis not present

## 2020-01-02 DIAGNOSIS — M1990 Unspecified osteoarthritis, unspecified site: Secondary | ICD-10-CM | POA: Diagnosis not present

## 2020-01-02 DIAGNOSIS — M79641 Pain in right hand: Secondary | ICD-10-CM | POA: Diagnosis not present

## 2020-01-02 DIAGNOSIS — M79672 Pain in left foot: Secondary | ICD-10-CM | POA: Diagnosis not present

## 2020-01-11 DIAGNOSIS — E1169 Type 2 diabetes mellitus with other specified complication: Secondary | ICD-10-CM | POA: Diagnosis not present

## 2020-01-11 DIAGNOSIS — M069 Rheumatoid arthritis, unspecified: Secondary | ICD-10-CM | POA: Diagnosis not present

## 2020-01-11 DIAGNOSIS — I509 Heart failure, unspecified: Secondary | ICD-10-CM | POA: Diagnosis not present

## 2020-01-11 DIAGNOSIS — R600 Localized edema: Secondary | ICD-10-CM | POA: Diagnosis not present

## 2020-01-11 DIAGNOSIS — I10 Essential (primary) hypertension: Secondary | ICD-10-CM | POA: Diagnosis not present

## 2020-02-01 DIAGNOSIS — M542 Cervicalgia: Secondary | ICD-10-CM | POA: Diagnosis not present

## 2020-02-01 DIAGNOSIS — D649 Anemia, unspecified: Secondary | ICD-10-CM | POA: Diagnosis not present

## 2020-02-01 DIAGNOSIS — I10 Essential (primary) hypertension: Secondary | ICD-10-CM | POA: Diagnosis not present

## 2020-02-01 DIAGNOSIS — M069 Rheumatoid arthritis, unspecified: Secondary | ICD-10-CM | POA: Diagnosis not present

## 2020-02-01 DIAGNOSIS — Z794 Long term (current) use of insulin: Secondary | ICD-10-CM | POA: Diagnosis not present

## 2020-02-01 DIAGNOSIS — E11621 Type 2 diabetes mellitus with foot ulcer: Secondary | ICD-10-CM | POA: Diagnosis not present

## 2020-02-01 DIAGNOSIS — R600 Localized edema: Secondary | ICD-10-CM | POA: Diagnosis not present

## 2020-02-01 DIAGNOSIS — E1169 Type 2 diabetes mellitus with other specified complication: Secondary | ICD-10-CM | POA: Diagnosis not present

## 2020-03-07 DIAGNOSIS — M791 Myalgia, unspecified site: Secondary | ICD-10-CM | POA: Diagnosis not present

## 2020-03-07 DIAGNOSIS — Z79899 Other long term (current) drug therapy: Secondary | ICD-10-CM | POA: Diagnosis not present

## 2020-03-07 DIAGNOSIS — I509 Heart failure, unspecified: Secondary | ICD-10-CM | POA: Diagnosis not present

## 2020-03-07 DIAGNOSIS — M0579 Rheumatoid arthritis with rheumatoid factor of multiple sites without organ or systems involvement: Secondary | ICD-10-CM | POA: Diagnosis not present

## 2020-03-07 DIAGNOSIS — M2550 Pain in unspecified joint: Secondary | ICD-10-CM | POA: Diagnosis not present

## 2020-03-07 DIAGNOSIS — R700 Elevated erythrocyte sedimentation rate: Secondary | ICD-10-CM | POA: Diagnosis not present

## 2020-03-07 DIAGNOSIS — R748 Abnormal levels of other serum enzymes: Secondary | ICD-10-CM | POA: Diagnosis not present

## 2020-03-07 DIAGNOSIS — M1990 Unspecified osteoarthritis, unspecified site: Secondary | ICD-10-CM | POA: Diagnosis not present

## 2020-03-15 ENCOUNTER — Encounter: Payer: Self-pay | Admitting: Neurology

## 2020-03-20 ENCOUNTER — Ambulatory Visit
Admission: EM | Admit: 2020-03-20 | Discharge: 2020-03-20 | Disposition: A | Discharge status: Home / Self Care | Payer: Medicare HMO

## 2020-03-20 DIAGNOSIS — R5383 Other fatigue: Secondary | ICD-10-CM | POA: Diagnosis not present

## 2020-03-20 LAB — COMPREHENSIVE METABOLIC PANEL
ALT: 42 IU/L — ABNORMAL HIGH (ref 0–32)
AST: 28 IU/L (ref 0–40)
Albumin/Globulin Ratio: 1.4 (ref 1.2–2.2)
Albumin: 4.1 g/dL (ref 3.8–4.9)
Alkaline Phosphatase: 151 IU/L — ABNORMAL HIGH (ref 48–121)
BUN/Creatinine Ratio: 22 (ref 9–23)
BUN: 18 mg/dL (ref 6–24)
Bilirubin Total: 0.2 mg/dL (ref 0.0–1.2)
CO2: 26 mmol/L (ref 20–29)
Calcium: 9.9 mg/dL (ref 8.7–10.2)
Chloride: 100 mmol/L (ref 96–106)
Creatinine, Ser: 0.81 mg/dL (ref 0.57–1.00)
GFR calc Af Amer: 92 mL/min/{1.73_m2} (ref >59)
GFR calc non Af Amer: 80 mL/min/{1.73_m2} (ref >59)
Globulin, Total: 3 g/dL (ref 1.5–4.5)
Glucose: 80 mg/dL (ref 65–99)
Potassium: 4.2 mmol/L (ref 3.5–5.2)
Sodium: 138 mmol/L (ref 134–144)
Total Protein: 7.1 g/dL (ref 6.0–8.5)

## 2020-03-20 LAB — CBC WITH DIFFERENTIAL/PLATELET
Basophils Absolute: 0 10*3/uL (ref 0.0–0.2)
Basos: 0 %
EOS (ABSOLUTE): 0.2 10*3/uL (ref 0.0–0.4)
Eos: 2 %
Hematocrit: 39.9 % (ref 34.0–46.6)
Hemoglobin: 12.5 g/dL (ref 11.1–15.9)
Immature Grans (Abs): 0 10*3/uL (ref 0.0–0.1)
Immature Granulocytes: 0 %
Lymphocytes Absolute: 3.1 10*3/uL (ref 0.7–3.1)
Lymphs: 32 %
MCH: 25.4 pg — ABNORMAL LOW (ref 26.6–33.0)
MCHC: 31.3 g/dL — ABNORMAL LOW (ref 31.5–35.7)
MCV: 81 fL (ref 79–97)
Monocytes Absolute: 0.6 10*3/uL (ref 0.1–0.9)
Monocytes: 6 %
Neutrophils Absolute: 5.8 10*3/uL (ref 1.4–7.0)
Neutrophils: 60 %
Platelets: 277 10*3/uL (ref 150–450)
RBC: 4.93 x10E6/uL (ref 3.77–5.28)
RDW: 14.5 % (ref 11.7–15.4)
WBC: 9.8 10*3/uL (ref 3.4–10.8)

## 2020-03-20 LAB — POCT URINALYSIS DIP (MANUAL ENTRY)
Bilirubin, UA: NEGATIVE
Glucose, UA: NEGATIVE mg/dL
Leukocytes, UA: NEGATIVE
Nitrite, UA: NEGATIVE
Protein Ur, POC: 30 mg/dL — AB
Spec Grav, UA: >=1.03 — AB (ref 1.010–1.025)
Urobilinogen, UA: 0.2 E.U./dL
pH, UA: 5.5 (ref 5.0–8.0)

## 2020-03-20 LAB — POCT FASTING CBG KUC MANUAL ENTRY: POCT Glucose (KUC): 97 mg/dL (ref 70–99)

## 2020-03-20 NOTE — ED Provider Notes (Signed)
[facility]    CSN: [id number] Arrival date & time: 03/20/20  1629      History   Chief Complaint Chief Complaint  Patient presents with   Fatigue    HPI Katelyn Howard is a 59 y.o. female.   59 year old female comes in for 1 week history of fatigue/weakness. States had left sided numbness 3 days ago that resolved over night. Had some RLQ abdominal pain. Nausea without vomiting. Mild nasal congestion. Denies other URI symptoms. Denies fever. Denies chest pain, shortness of breath, orthopnea. Has baseline leg swelling, denies any changes. No urinary changes. No blood in the stool.   Had not used her cpap machine since 12/2019 due to broken piece. Scheduled to get fixed in 2 days.      Past Medical History:  Diagnosis Date   Allergy    Arthritis    Asthma    CHF (congestive heart failure) ([facility]) 2008   Diabetes mellitus    Edema extremities    GERD (gastroesophageal reflux disease)    Hyperlipidemia    Hypertension    Neuromuscular disorder (HCC)    neuropathy   Obesity    OSA (obstructive sleep apnea) 10/13/2016   Severe with AHI 44.9/hr now on CPAP at 11cm H2O   Sleep apnea    wears cpap     Patient Active Problem List   Diagnosis Date Noted   OSA (obstructive sleep apnea) 10/13/2016   Excessive daytime sleepiness 06/04/2016   Gasping for breath 06/04/2016   DM, UNCOMPLICATED, TYPE II, UNCONTROLLED 02/12/2007   HYPERCHOLESTEROLEMIA, PURE 02/12/2007   Obesity 02/12/2007   HYPERTENSION, BENIGN ESSENTIAL, CONTROLLED 02/12/2007   ALLERGIC RHINITIS 02/12/2007   ASTHMA, CHILDHOOD 02/12/2007   DEGENERATIVE JOINT DISEASE 02/12/2007    Past Surgical History:  Procedure Laterality Date   ABDOMINAL HYSTERECTOMY     EYE SURGERY      OB History   No obstetric history on file.      Home Medications    Prior to Admission medications   Medication Sig Start Date End Date Taking? Authorizing Provider  folic acid  (FOLVITE) 400 MCG tablet Take 400 mcg by mouth daily.   Yes [provider]  acetaminophen (TYLENOL) 500 MG tablet Take 1 tablet (500 mg total) by mouth every 6 (six) hours as needed. 05/04/15   Dowless, Lelon Mast Tripp, PA-C  albuterol (PROVENTIL HFA;VENTOLIN HFA) 108 (90 BASE) MCG/ACT inhaler Inhale 2 puffs into the lungs every 6 (six) hours as needed. For wheezing    [provider]  amLODipine (NORVASC) 5 MG tablet Take 5 mg by mouth daily.    [provider]  aspirin EC 81 MG tablet Take 81 mg by mouth daily.    [provider]  atorvastatin (LIPITOR) 80 MG tablet Take 80 mg by mouth every morning.  04/25/15   [provider]  Cholecalciferol 50000 units capsule Take 50,000 Units by mouth every Thursday.     [provider]  cloNIDine (CATAPRES) 0.1 MG tablet Take 1 tablet (0.1 mg total) by mouth 2 (two) times daily. 03/08/16   Blane Ohara, MD  clotrimazole (LOTRIMIN) 1 % cream Apply 1 application topically daily as needed (for rash).    [provider]  clotrimazole-betamethasone (LOTRISONE) cream Apply 1 application topically 2 (two) times daily as needed (for rash).    [provider]  esomeprazole (NEXIUM) 40 MG capsule Take 40 mg by mouth 2 (two) times daily before a meal.  04/25/15  [provider]  fluticasone (CUTIVATE) 0.05 % cream Apply topically 2 (two) times daily. 04/23/15   Linna Hoff, MD  fluticasone (FLONASE) 50 MCG/ACT nasal spray Place 1 spray into both nostrils 2 (two) times daily. 05/13/16   [provider]  furosemide (LASIX) 20 MG tablet Take 20 mg by mouth daily.    [provider]  glimepiride (AMARYL) 4 MG tablet Take 4 mg by mouth daily with breakfast.  04/25/15   [provider]  HYDROcodone-acetaminophen (NORCO/VICODIN) 5-325 MG tablet Take 1 tablet by mouth as directed. 05/22/16   [provider]  hydrOXYzine (ATARAX/VISTARIL) 25 MG tablet Take 1 tablet (25  mg total) by mouth every 6 (six) hours. Prn itching 04/23/15   Linna Hoff, MD  lisinopril-hydrochlorothiazide (PRINZIDE,ZESTORETIC) 20-25 MG tablet Take 1 tablet by mouth daily. 05/13/16   [provider]  methocarbamol (ROBAXIN) 500 MG tablet Take 1 tablet by mouth daily. 05/21/16   [provider]  nebivolol (BYSTOLIC) 10 MG tablet Take 10 mg by mouth daily.    [provider]  potassium chloride (K-DUR) 10 MEQ tablet Take 10 mEq by mouth daily.     [provider]  TRULICITY 1.5 MG/0.5ML SOPN Inject 1.5 mg as directed every Wednesday.  05/26/16   [provider]    Family History Family History  Problem Relation Age of Onset   Stroke Mother    Hypertension Father    Arthritis Father    Gout Father    Hypertension Sister    Diabetes Sister    Heart attack Sister    Diabetes Brother    Stroke Brother    Hypertension Brother    Colon cancer Neg Hx    Colon polyps Neg Hx    Rectal cancer Neg Hx    Stomach cancer Neg Hx     Social History Social History   Tobacco Use   Smoking status: Never Smoker   Smokeless tobacco: Never Used  Substance Use Topics   Alcohol use: No   Drug use: No     Allergies   Shrimp [shellfish allergy], Tetracycline, and Penicillins   Review of Systems Review of Systems  Reason unable to perform ROS: See HPI as above.     Physical Exam Triage Vital Signs ED Triage Vitals  Enc Vitals Group     BP 03/20/20 1751 (!) 162/92     Pulse Rate 03/20/20 1751 74     Resp 03/20/20 1751 (!) 24     Temp 03/20/20 1751 98 F (36.7 C)     Temp src --      SpO2 03/20/20 1751 97 %     Weight --      Height --      Head Circumference --      Peak Flow --      Pain Score 03/20/20 1802 7     Pain Loc --      Pain Edu? --      Excl. in GC? --    No data found.  Updated Vital Signs BP (!) 162/92 (BP Location: Left Arm)    Pulse 74    Temp 98 F (36.7 C)    Resp (!) 24    SpO2 97%    Physical Exam Constitutional:      General: She is not in acute distress.    Appearance: Normal appearance. She is well-developed. She is not toxic-appearing or diaphoretic.  HENT:     Head: Normocephalic  and atraumatic.  Eyes:     Conjunctiva/sclera: Conjunctivae normal.     Pupils: Pupils are equal, round, and reactive to light.  Cardiovascular:     Rate and Rhythm: Normal rate and regular rhythm.     Heart sounds: No murmur heard.  No friction rub. No gallop.   Pulmonary:     Effort: Pulmonary effort is normal. No respiratory distress.     Comments: LCTAB Abdominal:     General: Bowel sounds are normal.     Palpations: Abdomen is soft.     Tenderness: There is no abdominal tenderness. There is no right CVA tenderness, left CVA tenderness, guarding or rebound.  Musculoskeletal:     Cervical back: Normal range of motion and neck supple.     Comments: 1+ pitting edema bilaterally to the ankles. No erythema, warmth.   Skin:    General: Skin is warm and dry.  Neurological:     Mental Status: She is alert and oriented to person, place, and time.     Comments: Grossly intact without focal deficits. Able to ambulate on own as baseline using cane.       UC Treatments / Results  Labs (all labs ordered are listed, but only abnormal results are displayed) Labs Reviewed  POCT URINALYSIS DIP (MANUAL ENTRY) - Abnormal; Notable for the following components:      Result Value   Clarity, UA cloudy (*)    Ketones, POC UA trace (5) (*)    Spec Grav, UA >=1.030 (*)    Blood, UA trace-lysed (*)    Protein Ur, POC =30 (*)    All other components within normal limits  CBC WITH DIFFERENTIAL/PLATELET  COMPREHENSIVE METABOLIC PANEL  POCT FASTING CBG [facility] MANUAL ENTRY    EKG   Radiology No results found.  Procedures Procedures (including critical care time)  Medications Ordered in UC Medications - No data to display  Initial Impression / Assessment and Plan / UC Course  I have  reviewed the triage vital signs and the nursing notes.  Pertinent labs & imaging results that were available during my care of the patient were reviewed by me and considered in my medical decision making (see chart for details).    Urine dipstick without leukocytes or nitrites.  EKG normal sinus rhythm with first-degree AV block, 68 bpm, no acute ST elevation.  First-degree AV block new compared to prior EKG.  Discussed with patient of EKG results, first-degree block less likely causing current symptoms, though will require follow-up for further evaluation.  Will draw CBC/CMP for further evaluation.  Otherwise, fatigue could be due to not using CPAP for the past 3 months.  Return precautions given.  Otherwise to follow-up with PCP for further evaluation if symptoms not improving.  Patient discharged in stable condition pending lab results.  Case discussed with Dr Leonides Grills, who agrees to plan.  Final Clinical Impressions(s) / UC Diagnoses   Final diagnoses:  Fatigue, unspecified type    ED Prescriptions    None     PDMP not reviewed this encounter.   Belinda Fisher, PA-C 03/20/20 1934

## 2020-03-20 NOTE — ED Triage Notes (Addendum)
Pt c/o weakness/fatigue "feeling bad" since last Thursday. States Sunday night had numbness down left side that subside by Monday morning. States having RLQ pain on Sunday. States having nausea with no vomiting. Pt c/o having no energy. States decreased appetite. Denies any URI sx's  Pt states hasn't been able to use her c-pap machine since may.

## 2020-03-20 NOTE — Discharge Instructions (Signed)
Your EKG showed some changes that will need to follow up with PCP/cardiologist. However, this would not cause your symptoms right now. Urine negative for infection. Will draw CBC/CMP for further evaluation. Otherwise, follow up with PCP for further evaluation. If worsening symptoms, go to the ED for further evaluation.

## 2020-03-22 ENCOUNTER — Encounter: Payer: Self-pay | Admitting: Neurology

## 2020-03-22 ENCOUNTER — Ambulatory Visit (INDEPENDENT_AMBULATORY_CARE_PROVIDER_SITE_OTHER): Payer: Medicare HMO | Admitting: Neurology

## 2020-03-22 VITALS — BP 163/86 | HR 70 | Ht 61.0 in | Wt 327.0 lb

## 2020-03-22 DIAGNOSIS — Z9189 Other specified personal risk factors, not elsewhere classified: Secondary | ICD-10-CM | POA: Diagnosis not present

## 2020-03-22 DIAGNOSIS — I10 Essential (primary) hypertension: Secondary | ICD-10-CM

## 2020-03-22 DIAGNOSIS — G4719 Other hypersomnia: Secondary | ICD-10-CM | POA: Diagnosis not present

## 2020-03-22 DIAGNOSIS — Z9989 Dependence on other enabling machines and devices: Secondary | ICD-10-CM | POA: Diagnosis not present

## 2020-03-22 DIAGNOSIS — E669 Obesity, unspecified: Secondary | ICD-10-CM | POA: Diagnosis not present

## 2020-03-22 DIAGNOSIS — E662 Morbid (severe) obesity with alveolar hypoventilation: Secondary | ICD-10-CM | POA: Diagnosis not present

## 2020-03-22 DIAGNOSIS — J4520 Mild intermittent asthma, uncomplicated: Secondary | ICD-10-CM | POA: Diagnosis not present

## 2020-03-22 NOTE — Progress Notes (Signed)
pt alone, rm 11. pt states that she had a SS 2017. she started CPAP recently a pc has broke which has not allowed her to continue the use of the CPAP.  SLEEP MEDICINE CLINIC    Provider:  Melvyn Novas, MD  Primary Care Physician:  Rodrigo Ran, MD [address] [place] [address]     Referring Provider: Rodrigo Ran, Md [address] [place],  [place] [address]          Chief Complaint according to patient   Patient presents with:    . New Patient (Initial Visit)           HISTORY OF PRESENT ILLNESS:  Katelyn Howard is a 59 year-  old [place] American female patient seen here as a referral on 03/22/2020 from Dr perinin  for an emergent transfer of care. CPAP dependent patient.   Chief concern according to patient :  pt alone, rm 11. pt states that she had a SS 2017. she started CPAP after a SPLIT study at [facility], 2017 , Dr. Armanda Magic . She slept not even 10 minutes on CPAP, yet was prescribed therapy!  Used Machine until May 2021  , but the mask to hose connector is now broken which has not allowed her to continue the use of the CPAP.    I have the pleasure of seeing Katelyn DUNCANSON today, a right -handed African American female with a possible sleep disorder.   She  has a past medical history of Allergy, Arthritis, Asthma, CHF (congestive heart failure) (HCC) (2008), Diabetes mellitus, Edema extremities, GERD (gastroesophageal reflux disease), Hyperlipidemia, Hypertension, Neuromuscular disorder (HCC), Obesity, OSA (obstructive sleep apnea) (10/13/2016). Not Oxygen dependent-  Asthma, bronchitis, SOB.    The patient had the first sleep study in the year 07-21-2016  with a result of an AHI ( Apnea Hypopnea index)  of 44.9/h.  Sleep relevant medical history: insomnia without CPAP since March- Nocturia 3 times. Sleep headaches. Obesity- BMI is 62, Family medical /sleep history: No other family member on CPAP with OSA, insomnia, sleep walkers.    Social  history:  Patient is disabled since 2013 , and was unemployed since 2008. She  lives in a household with spouse. Family status is married , with  2 sons, 57 and 94. Pets are  Present, one dog.  Tobacco use: never .  ETOH use ; none ,  Caffeine intake in form of Coffee( rarely) Soda(2 /week) Tea ( 1 glas a day) or energy drinks.   Sleep habits are as follows: The patient's lunch time at 12 dinner time is between 7 PM. The patient goes to bed at 1-2 PM and continues to  Struggle to sleep since CPAP is not working well- she sleep for 5-7   Hours when on CPAP. Now she wakes for3 bathroom breaks.   The preferred sleep position is laterally, , with the support of 2-3 pillows.  Dreams are reportedly frequent .   8-9 AM is the usual rise time. She often returns to bed again until 12 noon. The patient wakes up spontaneously.  She reports not feeling refreshed or restored in AM, with symptoms such as dry mouth morning headaches, and residual fatigue.  Naps are taken frequently, lasting from 60-80 minutes and are more refreshing than nocturnal sleep.    Review of Systems: Out of a complete 14 system review, the patient complains of only the following symptoms, and all other reviewed systems are negative.:  Fatigue,  sleepiness , snoring, fragmented sleep, Insomnia - since without her CPAP.    How likely are you to doze in the following situations: 0 = not likely, 1 = slight chance, 2 = moderate chance, 3 = high chance   Sitting and Reading? Watching Television? Sitting inactive in a public place (theater or meeting)? As a passenger in a car for an hour without a break? Lying down in the afternoon when circumstances permit? Sitting and talking to someone? Sitting quietly after lunch without alcohol? In a car, while stopped for a few minutes in traffic?   Total = 16/ 24 points   FSS endorsed at 41/ 63 points.   Social History   Socioeconomic History  . Marital status: Married    Spouse name: Not  on file  . Number of children: Not on file  . Years of education: Not on file  . Highest education level: Not on file  Occupational History  . Not on file  Tobacco Use  . Smoking status: Never Smoker  . Smokeless tobacco: Never Used  Substance and Sexual Activity  . Alcohol use: No  . Drug use: No  . Sexual activity: Yes    Birth control/protection: Surgical  Other Topics Concern  . Not on file  Social History Narrative  . Not on file   Social Determinants of Health   Financial Resource Strain:   . Difficulty of Paying Living Expenses:   Food Insecurity:   . Worried About Programme researcher, broadcasting/film/video in the Last Year:   . Barista in the Last Year:   Transportation Needs:   . Freight forwarder (Medical):   Marland Kitchen Lack of Transportation (Non-Medical):   Physical Activity:   . Days of Exercise per Week:   . Minutes of Exercise per Session:   Stress:   . Feeling of Stress :   Social Connections:   . Frequency of Communication with Friends and Family:   . Frequency of Social Gatherings with Friends and Family:   . Attends Religious Services:   . Active Member of Clubs or Organizations:   . Attends Banker Meetings:   Marland Kitchen Marital Status:     Family History  Problem Relation Age of Onset  . Stroke Mother   . Hypertension Father   . Arthritis Father   . Gout Father   . Hypertension Sister   . Diabetes Sister   . Heart attack Sister   . Diabetes Brother   . Stroke Brother   . Hypertension Brother   . Colon cancer Neg Hx   . Colon polyps Neg Hx   . Rectal cancer Neg Hx   . Stomach cancer Neg Hx     Past Medical History:  Diagnosis Date  . Allergy   . Arthritis   . Asthma   . CHF (congestive heart failure) (HCC) 2008  . Diabetes mellitus   . Edema extremities   . GERD (gastroesophageal reflux disease)   . Hyperlipidemia   . Hypertension   . Neuromuscular disorder (HCC)    neuropathy  . Obesity   . OSA (obstructive sleep apnea) 10/13/2016    Severe with AHI 44.9/hr now on CPAP at 11cm H2O  . Sleep apnea    wears cpap     Past Surgical History:  Procedure Laterality Date  . ABDOMINAL HYSTERECTOMY    . EYE SURGERY       Current Outpatient Medications on File Prior to Visit  Medication Sig Dispense  Refill  . acetaminophen (TYLENOL) 500 MG tablet Take 1 tablet (500 mg total) by mouth every 6 (six) hours as needed. 30 tablet 0  . albuterol (PROVENTIL HFA;VENTOLIN HFA) 108 (90 BASE) MCG/ACT inhaler Inhale 2 puffs into the lungs every 6 (six) hours as needed. For wheezing    . amLODipine (NORVASC) 5 MG tablet Take 5 mg by mouth daily.    Marland Kitchen aspirin EC 81 MG tablet Take 81 mg by mouth daily.    Marland Kitchen atorvastatin (LIPITOR) 80 MG tablet Take 80 mg by mouth every morning.     . Cholecalciferol 50000 units capsule Take 50,000 Units by mouth every Thursday.     . cloNIDine (CATAPRES) 0.1 MG tablet Take 1 tablet (0.1 mg total) by mouth 2 (two) times daily. 60 tablet 11  . clotrimazole (LOTRIMIN) 1 % cream Apply 1 application topically daily as needed (for rash).    . clotrimazole-betamethasone (LOTRISONE) cream Apply 1 application topically 2 (two) times daily as needed (for rash).    Marland Kitchen esomeprazole (NEXIUM) 40 MG capsule Take 40 mg by mouth 2 (two) times daily before a meal.     . fluticasone (CUTIVATE) 0.05 % cream Apply topically 2 (two) times daily. 30 g 1  . fluticasone (FLONASE) 50 MCG/ACT nasal spray Place 1 spray into both nostrils 2 (two) times daily.    . folic acid (FOLVITE) 400 MCG tablet Take 400 mcg by mouth daily.    . furosemide (LASIX) 20 MG tablet Take 20 mg by mouth daily.    Marland Kitchen glimepiride (AMARYL) 4 MG tablet Take 4 mg by mouth daily with breakfast.     . HYDROcodone-acetaminophen (NORCO/VICODIN) 5-325 MG tablet Take 1 tablet by mouth as directed.    . hydrOXYzine (ATARAX/VISTARIL) 25 MG tablet Take 1 tablet (25 mg total) by mouth every 6 (six) hours. Prn itching 20 tablet 1  . lisinopril-hydrochlorothiazide  (PRINZIDE,ZESTORETIC) 20-25 MG tablet Take 1 tablet by mouth daily.    . methocarbamol (ROBAXIN) 500 MG tablet Take 1 tablet by mouth daily.    . nebivolol (BYSTOLIC) 10 MG tablet Take 10 mg by mouth daily.    . potassium chloride (K-DUR) 10 MEQ tablet Take 10 mEq by mouth daily.     . TRULICITY 1.5 MG/0.5ML SOPN Inject 1.5 mg as directed every Wednesday.      No current facility-administered medications on file prior to visit.    Allergies  Allergen Reactions  . Shrimp [Shellfish Allergy] Anaphylaxis  . Tetracycline Nausea And Vomiting  . Penicillins Itching, Swelling and Other (See Comments)    Has patient had a PCN reaction causing immediate rash, facial/tongue/throat swelling, SOB or lightheadedness with hypotension: Unknown Has patient had a PCN reaction causing severe rash involving mucus membranes or skin necrosis: Unknown Has patient had a PCN reaction that required hospitalization: Unknown Has patient had a PCN reaction occurring within the last 10 years: No If all of the above answers are "NO", then may proceed with Cephalosporin use.     Physical exam:  Today's Vitals   03/22/20 1011  BP: (!) 163/86  Pulse: 70  Weight: (!) 327 lb (148.3 kg)  Height: 5\' 1"  (1.549 m)   Body mass index is 61.79 kg/m.   Wt Readings from Last 3 Encounters:  03/22/20 (!) 327 lb (148.3 kg)  11/29/19 (!) 325 lb (147.4 kg)  11/30/17 280 lb (127 kg)     Ht Readings from Last 3 Encounters:  03/22/20 5\' 1"  (1.549 m)  11/30/17 5'  3" (1.6 m)  09/29/17 5\' 3"  (1.6 m)      General: The patient is awake, alert and appears not in acute distress. The patient is well groomed. Head: Normocephalic, atraumatic. Neck is supple. Mallampati 3 plus   neck circumference:19 inches .  Nasal airflow barely patent.  Retrognathia is seen.   Cardiovascular:  Regular rate and cardiac rhythm by pulse,  without distended neck veins. Respiratory: Lungs are clear to auscultation.  Skin:  With  evidence of  ankle edema, finger swelling,  Trunk: morbidly obese.    Neurologic exam : The patient is awake and alert, oriented to place and time.   Memory subjective described as intact.  Attention span & concentration ability appears normal.  Speech is fluent,  with dysarthria, dysphonia , not  aphasia.  Mood and affect are appropriate.   Cranial nerves: no loss of smell or taste reported  Pupils are equal and briskly reactive to light. Funduscopic exam deferred.   Extraocular movements in vertical and horizontal planes were intact and without nystagmus. No Diplopia. Visual fields by finger perimetry are intact. Hearing was intact to soft voice and finger rubbing.    Facial sensation intact.  Facial motor strength is symmetric and tongue move midline.  Neck ROM : rotation, tilt and flexion extension were normal for age and shoulder shrug was symmetrical.    Motor exam:  Symmetric bulk, tone and ROM.   Normal tone without cog- wheeling, symmetric grip strength .   Sensory:  Fine touch, pinprick and vibration were  normal.  Proprioception tested in the upper extremities was normal.   Coordination: Rapid alternating movements in the fingers/hands were of normal speed.  The Finger-to-nose maneuver was intact without evidence of ataxia, dysmetria or tremor.   Gait and station: Patient could rise assisted from a seated position, braces herself - walked with a cane as  assistive device.  Stance is of normal width/ base and the patient turned with 4 steps.  Toe and heel walk were deferred.  Deep tendon reflexes: in the  upper and lower extremities are symmetric and attenuated. Babinski response was deferred .      After spending a total time of  45  minutes face to face and additional time for physical and neurologic examination, review of laboratory studies,  personal review of imaging studies, reports and results of other testing and review of referral information / records as far as provided in  visit, I have established the following assessments:  I quote her from my review_ I had the opportunity to review the [facility] long sleep study which was performed on 12-7 2017 soon for years ago.  The study led to her first CPAP prescription the patient achieved very severe AHI of 44.9/h most of these were hypopneas and obstructive.  Heart rate remained between 50 bpm and 100 bpm for most of the time, CO2 was not measured, and the patient was placed on CPAP her titration table revealed that she had great difficulties initially sleeping with the CPAP.  There was not a total of 11 minutes of sleep recorded a repeat 11 minutes on CPAP level of 10 cmH2O.  Prescribed was a CPAP of 11 cm water.  I also looked to see if she ever reached REM sleep during CPAP and it seems that she did for a brief period of time she was placed on a ResMed full facemask and AirFit F20 with heated humidification at 11 cm water   My Plan is  to proceed with:  98) 59 years old machine  2) she needs the adapter form her resmed FFM to the hose!! DME should be providing.  She never contacted the DME for this simple part, she did not try.  3)  I am sending an order to [facility] ( formerly known as [facility]) and I will reserve a Rv in 3 month to allow cmopliane check up. If her current compliance failure makes this refill impossible, I will need to order a SPLIT PSG  for her.  In 15 month I will order a new machine for her.   She hopefully can achieve restfull sleep and better control of asthma and HTN, and weight loss . This patient is at risk for hypoventilation and for central apnea, being on opiate pain medication.   I would like to thank Rodrigo Ran, MD and Rodrigo Ran, Md [address] [place],  [place] [address] for allowing me to meet with and to take care of this pleasant patient.   In short, HIBBA SCHRAM is presenting as a CPAP dependent patient who has not gotten good sleep since her adapter part broke.   CC: I will share my  notes with PCP.  Electronically signed by: Melvyn Novas, MD 03/22/2020 10:59 AM  [facility] Board certified by The [employer] of Sleep Medicine and Diplomate of the [employer] of Sleep Medicine. Board certified In Neurology through the ABPN, Fellow of the [employer] of Neurology. Medical Director of [facility].

## 2020-03-22 NOTE — Patient Instructions (Signed)
Obesity Hypoventilation Syndrome  Obesity hypoventilation syndrome (OHS) means that you are not breathing well enough to get air in and out of your lungs efficiently (ventilation). This causes a low oxygen level and a high carbon dioxide level in your blood (hypoventilation). Having too much total body fat (obesity) is a significant risk factor for developing OHS. OHS makes it harder for your heart to pump oxygen-rich blood to your body. It can cause sleep disturbances and make you feel sleepy during the day. Over time, OHS can increase your risk for:  Heart disease.  High blood pressure (hypertension).  Reduced ability to absorb sugar from the bloodstream (insulin resistance).  Heart failure. Over time, OHS weakens your heart and can lead to heart failure. What are the causes? The exact cause of OHS is not known. Possible causes include:  Pressure on the lungs from excess body weight.  Obesity-related changes in how much air the lungs can hold (lung capacity) and how much they can expand (lung compliance).  Failure of the brain to regulate oxygen and carbon dioxide levels properly.  Chemicals (hormones) produced by excess fat cells interfering with breathing regulation.  A breathing condition in which breathing pauses or becomes shallow during sleep (sleep apnea). This condition can eventually cause the body to ventilate poorly and to hold onto carbon dioxide during the day. What increases the risk? You may have a greater risk for OHS if you:  Have a BMI of 30 or higher. BMI is an estimate of body fat that is calculated from height and weight. For adults, a BMI of 30 or higher is considered obese.  Are 40?60 years old.  Carry most of your excess weight around your waist.  Experience moderate symptoms of sleep apnea. What are the signs or symptoms? The most common symptoms of OHS are:  Daytime sleepiness.  Lack of energy.  Shortness of breath.  Morning headaches.  Sleep  apnea.  Trouble concentrating.  Irritability, mood swings, or depression.  Swollen veins in the neck.  Swelling of the legs. How is this diagnosed? Your health care provider may suspect OHS if you are obese and have poor breathing during the day and at night. Your health care provider will also do a physical exam. You may have tests to:  Measure your BMI.  Measure your blood oxygen level with a sensor placed on your finger (pulse oximetry).  Measure blood oxygen and carbon dioxide in a blood sample.  Measure the amount of red blood cells in a blood sample. OHS causes the number of red blood cells you have to increase (polycythemia).  Check your breathing ability (pulmonary function testing).  Check your breathing ability, breathing patterns, and oxygen level while you sleep (sleep study). You may also have a chest X-ray to rule out other breathing problems. You may have an electrocardiogram (ECG) and or echocardiogram to check for signs of heart failure. How is this treated? Weight loss is the most important part of treatment for OHS, and it may be the only treatment that you need. Other treatments may include:  Using a device to open your airway while you sleep, such as a continuous positive airway pressure (CPAP) machine that delivers oxygen to your airway through a mask.  Surgery (gastric bypass surgery) to lower your BMI. This may be needed if: ? You are very obese. ? Other treatments have not worked for you. ? Your OHS is very severe and is causing organ damage, such as heart failure. Follow these   instructions at home:  Medicines  Take over-the-counter and prescription medicines only as told by your health care provider.  Ask your health care provider what medicines are safe for you. You may be told to avoid medicines that can impair breathing and make OHS worse, such as sedatives and narcotics. Sleeping habits  If you are prescribed a CPAP machine, make sure you  understand and use the machine as directed.  Try to get 8 hours of sleep every night.  Go to bed at the same time every night, and get up at the same time every day. General instructions  Work with your health care provider to make a diet and exercise plan that helps you reach and maintain a healthy weight.  Eat a healthy diet.  Avoid smoking.  Exercise regularly as told by your health care provider.  During the evening, do not drink caffeine and do not eat heavy meals.  Keep all follow-up visits as told by your health care provider. This is important. Contact a health care provider if:  You experience new or worsening shortness of breath.  You have chest pain.  You have an irregular heartbeat (palpitations).  You have dizziness.  You faint.  You develop a cough.  You have a fever.  You have chest pain when you breathe (pleurisy). This information is not intended to replace advice given to you by your health care provider. Make sure you discuss any questions you have with your health care provider. Document Revised: 11/26/2018 Document Reviewed: 01/14/2016 Elsevier Patient Education  2020 Elsevier Inc.  

## 2020-03-27 DIAGNOSIS — R0601 Orthopnea: Secondary | ICD-10-CM | POA: Diagnosis not present

## 2020-03-27 DIAGNOSIS — I509 Heart failure, unspecified: Secondary | ICD-10-CM | POA: Diagnosis not present

## 2020-03-27 DIAGNOSIS — J209 Acute bronchitis, unspecified: Secondary | ICD-10-CM | POA: Diagnosis not present

## 2020-03-27 DIAGNOSIS — G4733 Obstructive sleep apnea (adult) (pediatric): Secondary | ICD-10-CM | POA: Diagnosis not present

## 2020-03-27 DIAGNOSIS — E119 Type 2 diabetes mellitus without complications: Secondary | ICD-10-CM | POA: Diagnosis not present

## 2020-03-27 DIAGNOSIS — K219 Gastro-esophageal reflux disease without esophagitis: Secondary | ICD-10-CM | POA: Diagnosis not present

## 2020-03-30 DIAGNOSIS — J209 Acute bronchitis, unspecified: Secondary | ICD-10-CM | POA: Diagnosis not present

## 2020-04-02 ENCOUNTER — Ambulatory Visit (INDEPENDENT_AMBULATORY_CARE_PROVIDER_SITE_OTHER): Payer: Medicare HMO | Admitting: Neurology

## 2020-04-02 DIAGNOSIS — G4719 Other hypersomnia: Secondary | ICD-10-CM

## 2020-04-02 DIAGNOSIS — Z9989 Dependence on other enabling machines and devices: Secondary | ICD-10-CM

## 2020-04-02 DIAGNOSIS — E669 Obesity, unspecified: Secondary | ICD-10-CM

## 2020-04-02 DIAGNOSIS — G4733 Obstructive sleep apnea (adult) (pediatric): Secondary | ICD-10-CM

## 2020-04-02 DIAGNOSIS — I10 Essential (primary) hypertension: Secondary | ICD-10-CM

## 2020-04-09 DIAGNOSIS — J449 Chronic obstructive pulmonary disease, unspecified: Secondary | ICD-10-CM | POA: Diagnosis not present

## 2020-04-16 ENCOUNTER — Telehealth: Payer: Self-pay | Admitting: Neurology

## 2020-04-16 NOTE — Progress Notes (Signed)
POLYSOMNOGRAPHY IMPRESSION :   1. Severe Obstructive Sleep Apnea (OSA) with an AHI of 44/h was  confirmed. As CPAP was titrated, central apneas emerged, and REM  sleep rebounded.  2. All AHI finally resolved under CPAP of 14 cm water pressure  and FFM, but hypoxemia was associated with the emerging REM  sleep.    RECOMMENDATIONS: Start auto-titration capable CPAP device between  7 and 15 cm water pressure with 1 cm EPR and SIMPLUS model FFM  in medium, under heated humidification.  A follow up appointment will be scheduled in the Sleep Clinic at  [facility].

## 2020-04-16 NOTE — Telephone Encounter (Signed)
-----   Message from Melvyn Novas, MD sent at 04/16/2020 12:14 PM EDT ----- POLYSOMNOGRAPHY IMPRESSION :   1. Severe Obstructive Sleep Apnea (OSA) with an AHI of 44/h was  confirmed. As CPAP was titrated, central apneas emerged, and REM  sleep rebounded.  2. All AHI finally resolved under CPAP of 14 cm water pressure  and FFM, but hypoxemia was associated with the emerging REM  sleep.    RECOMMENDATIONS: Start auto-titration capable CPAP device between  7 and 15 cm water pressure with 1 cm EPR and SIMPLUS model FFM  in medium, under heated humidification.  A follow up appointment will be scheduled in the Sleep Clinic at  [facility].

## 2020-04-16 NOTE — Telephone Encounter (Signed)
I called pt. I advised pt that Dr. Vickey Huger reviewed their sleep study results and found that pt severe sleep apnea. Dr. Vickey Huger  recommends that pt severe sleep apnea. I reviewed PAP compliance expectations with the pt. Pt is agreeable to starting a CPAP. I advised pt that an order will be sent to a DME, [facility] and [facility] will call the pt within about one week after they file with the pt's insurance. [facility]) will show the pt how to use the machine, fit for masks, and troubleshoot the CPAP if needed. A follow up appt was made for insurance purposes with Dr. Vickey Huger on Nov 17,2021 at 2:30 pm. Pt verbalized understanding to arrive 15 minutes early and bring their CPAP. A letter with all of this information in it will be mailed to the pt as a reminder. I verified with the pt that the address we have on file is correct. Pt verbalized understanding of results. Pt had no questions at this time but was encouraged to call back if questions arise. I have sent the order to [facility]) and have received confirmation that they have received the order.

## 2020-04-16 NOTE — Addendum Note (Signed)
Addended by: Melvyn Novas on: 04/16/2020 12:14 PM   Modules accepted: Orders

## 2020-04-16 NOTE — Procedures (Signed)
PATIENT'S NAME:  Elmer, Boutelle DOB:      Dec 22, 1960      MR#:    [id number]     DATE OF RECORDING: 04/02/2020 REFERRING M.D.:  Rodrigo Ran MD Study Performed:  Split-Night Titration Study HISTORY:  LALONNIE SHAFFER is a 59 year old African American female patient was seen upon consultation request by Dr Waynard Edwards, on 03/22/2020 for an emergent transfer of care in a CPAP dependent patient.   Chief concern according to patient:  The patient had started CPAP after a SPLIT study at [facility], 2017, interpreted by Dr. Armanda Magic. The patient had the first sleep study in the year 07-21-2016 with a result of an AHI (Apnea Hypopnea index) of 44.9/h.   She slept not even 10 minutes on CPAP yet was prescribed therapy! She had used Machine until May 2021, but the mask to hose connector is now broken which has not allowed her to continue the use of the CPAP.    She has a past medical history of Allergy, Arthritis, Asthma, CHF (congestive heart failure) (HCC) (2008), Diabetes mellitus, Edema extremities, GERD (gastroesophageal reflux disease), Hyperlipidemia, Hypertension, Neuromuscular disorder (HCC), Obesity, OSA (obstructive sleep apnea) (10/13/2016). Not Oxygen dependent- Asthma, Bronchitis, SOB.    Sleep relevant medical history: insomnia without CPAP since March- Nocturia 3 times. Sleep headaches. Obesity- BMI is 29, Social history:  Patient is disabled since 2013.  8-9 AM is the usual rise time. She often returns to bed again until 12 noon.   She reports not feeling refreshed or restored in AM, with symptoms such as dry mouth morning headaches, and residual fatigue. Naps are taken frequently, lasting from 60-80 minutes and are more refreshing than nocturnal sleep.  The patient endorsed the Epworth Sleepiness Scale at 16/24 points   The patient's weight 327 pounds with a height of 61 (inches), resulting in a BMI of 61.6 kg/m2. The patient's neck circumference measured 19 inches.  CURRENT  MEDICATIONS: Tylenol, Proventil, Norvasc, Aspirin, Lipitor, cholecalciferol, Catapres, Lotrimin, Lotrisone, Nexium, Cutivate, Flonase, Lasix, Amaryl, Norco, Atarax, Prinzide, Robaxin, Bystolic, Kdur, Trulicity.   PROCEDURE:  This is a multichannel digital polysomnogram utilizing the Somnostar 11.2 system.  Electrodes and sensors were applied and monitored per AASM Specifications.   EEG, EOG, Chin and Limb EMG, were sampled at 200 Hz.  ECG, Snore and Nasal Pressure, Thermal Airflow, Respiratory Effort, CPAP Flow and Pressure, Oximetry was sampled at 50 Hz. Digital video and audio were recorded.      BASELINE STUDY WITHOUT CPAP RESULTS: Lights Out was at 22:21 and Lights On at 05:04.  Total recording time (TRT) was 188.5, with a total sleep time (TST) of 121.5 minutes.   The patient's sleep latency was 90.5 minutes.  REM latency was 0 minutes.  The sleep efficiency was 64.5 %.    SLEEP ARCHITECTURE: WASO (Wake after sleep onset) was 18.5 minutes, Stage N1 was 5 minutes, Stage N2 was 97.5 minutes, Stage N3 was 19 minutes and Stage R (REM sleep) was 0 minutes.  The percentages were Stage N1 4.1%, Stage N2 80.2%, Stage N3 15.6% and Stage R (REM sleep) 0%.   RESPIRATORY ANALYSIS:  There were a total of 90 respiratory events:  39 obstructive apneas, 2 central apneas and 0 mixed apneas with a total of 41 apneas and an apnea index (AI) of 20.2. There were 49 hypopneas with a hypopnea index of 24.2. Loud Snoring was noted. The total APNEA/HYPOPNEA INDEX (AHI) was 44.4 /hour and the total RESPIRATORY DISTURBANCE INDEX was 44.4 /  hour.  0 events occurred in REM sleep and 100 events in NREM. The REM AHI was 0, /hour versus a non-REM AHI of 44.4 /hour. The patient spent 34.5 minutes sleep time in the supine position 275 minutes in non-supine. The supine AHI was 67.5 /hour versus a non-supine AHI of 42.8 /hour.  OXYGEN SATURATION & C02:  The wake baseline 02 saturation was 98%, with the lowest being 89%. Time spent  below 89% saturation equaled 0 minutes. The arousals were noted as: 26 were spontaneous, 0 were associated with PLMs, 33 were associated with respiratory events. EKG was in keeping with normal sinus rhythm (NSR)   TITRATION STUDY WITH CPAP RESULTS:   CPAP was initiated with use of a Simplus FFM in medium size at 5 cmH20 with heated humidity per AASM split night standards and pressure was advanced to 14cmH20 because of hypopneas, apneas and desaturations.  At a PAP pressure of 14 cmH20, there was a reduction of the AHI to 0 /hour during a total therapy time of 79 minutes with 75 minutes of sleep.   Total recording time (TRT) was 215.5 minutes, with a total sleep time (TST) of 188 minutes. The patient's sleep latency was 20 minutes. REM latency was 95.5 minutes.  The sleep efficiency was 87.2 %.    SLEEP ARCHITECTURE: Wake after sleep was 15.5 minutes, Stage N1 4.5 minutes, Stage N2 98.5 minutes, Stage N3 24.5 minutes and Stage R (REM sleep) 60.5 minutes. The percentages were: Stage N1 2.4%, Stage N2 52.4%, Stage N3 13.% and Stage R (REM sleep) 32.2%. The sleep architecture was notable for REM sleep rebounding, but also being associated with hypoxic events.  RESPIRATORY ANALYSIS:  There were a total of 77 respiratory events: 38 obstructive apneas, 23 central apneas and 0 mixed apneas with 16 hypopneas.      The total APNEA/HYPOPNEA INDEX (AHI) was 24.6 /hour.  38 events occurred in REM sleep and 39 events in NREM. The REM AHI was 37.7 /hour versus a non-REM AHI of 18.4 /hour. The patient spent 14% of total sleep time in the supine position. The supine AHI was 63.4 /hour, versus a non-supine AHI of 18.2/hour.  OXYGEN SATURATION & C02:  The wake baseline 02 saturation was 99%, with the lowest being 78%. Time spent below 89% saturation equaled 14 minutes.  The arousals were noted as: 25 were spontaneous, 0 were associated with PLMs, 33 were associated with respiratory events.     POLYSOMNOGRAPHY  IMPRESSION :   1. Severe Obstructive Sleep Apnea (OSA) with an AHI of 44/h was confirmed. As CPAP was titrated, central apneas emerged, and REM sleep rebounded.  2. All AHI finally resolved under CPAP of 14 cm water pressure and FFM, but hypoxemia was associated with the emerging REM sleep.    RECOMMENDATIONS: Start auto-titration capable CPAP device between 7 and 15 cm water pressure with 1 cm EPR and SIMPLUS model  FFM in medium, under heated humidification.  A follow up appointment will be scheduled in the Sleep Clinic at [facility].      I certify that I have reviewed the entire raw data recording prior to the issuance of this report in accordance with the Standards of Accreditation of the American Academy of Sleep Medicine (AASM)     Melvyn Novas, M.D. Diplomat, Biomedical engineer of Psychiatry and Neurology  Diplomat, Biomedical engineer of Sleep Medicine Wellsite geologist, [employer] [facility] [employer]

## 2020-04-18 DIAGNOSIS — Z79899 Other long term (current) drug therapy: Secondary | ICD-10-CM | POA: Diagnosis not present

## 2020-04-18 DIAGNOSIS — R748 Abnormal levels of other serum enzymes: Secondary | ICD-10-CM | POA: Diagnosis not present

## 2020-04-18 DIAGNOSIS — R700 Elevated erythrocyte sedimentation rate: Secondary | ICD-10-CM | POA: Diagnosis not present

## 2020-04-18 DIAGNOSIS — I509 Heart failure, unspecified: Secondary | ICD-10-CM | POA: Diagnosis not present

## 2020-04-18 DIAGNOSIS — M1990 Unspecified osteoarthritis, unspecified site: Secondary | ICD-10-CM | POA: Diagnosis not present

## 2020-04-18 DIAGNOSIS — M791 Myalgia, unspecified site: Secondary | ICD-10-CM | POA: Diagnosis not present

## 2020-04-18 DIAGNOSIS — M0579 Rheumatoid arthritis with rheumatoid factor of multiple sites without organ or systems involvement: Secondary | ICD-10-CM | POA: Diagnosis not present

## 2020-04-30 DIAGNOSIS — J209 Acute bronchitis, unspecified: Secondary | ICD-10-CM | POA: Diagnosis not present

## 2020-05-04 DIAGNOSIS — G4733 Obstructive sleep apnea (adult) (pediatric): Secondary | ICD-10-CM | POA: Diagnosis not present

## 2020-05-30 DIAGNOSIS — J209 Acute bronchitis, unspecified: Secondary | ICD-10-CM | POA: Diagnosis not present

## 2020-06-13 DIAGNOSIS — J449 Chronic obstructive pulmonary disease, unspecified: Secondary | ICD-10-CM | POA: Diagnosis not present

## 2020-06-30 DIAGNOSIS — J209 Acute bronchitis, unspecified: Secondary | ICD-10-CM | POA: Diagnosis not present

## 2020-07-04 ENCOUNTER — Ambulatory Visit: Payer: Self-pay | Admitting: Neurology

## 2020-07-18 ENCOUNTER — Ambulatory Visit (INDEPENDENT_AMBULATORY_CARE_PROVIDER_SITE_OTHER): Payer: Medicare HMO | Admitting: Neurology

## 2020-07-18 ENCOUNTER — Encounter: Payer: Self-pay | Admitting: Neurology

## 2020-07-18 VITALS — BP 158/86 | HR 72 | Ht 63.0 in | Wt 329.0 lb

## 2020-07-18 DIAGNOSIS — M0579 Rheumatoid arthritis with rheumatoid factor of multiple sites without organ or systems involvement: Secondary | ICD-10-CM | POA: Diagnosis not present

## 2020-07-18 DIAGNOSIS — R700 Elevated erythrocyte sedimentation rate: Secondary | ICD-10-CM | POA: Diagnosis not present

## 2020-07-18 DIAGNOSIS — M791 Myalgia, unspecified site: Secondary | ICD-10-CM | POA: Diagnosis not present

## 2020-07-18 DIAGNOSIS — E662 Morbid (severe) obesity with alveolar hypoventilation: Secondary | ICD-10-CM

## 2020-07-18 DIAGNOSIS — Z9989 Dependence on other enabling machines and devices: Secondary | ICD-10-CM

## 2020-07-18 DIAGNOSIS — M1990 Unspecified osteoarthritis, unspecified site: Secondary | ICD-10-CM | POA: Diagnosis not present

## 2020-07-18 DIAGNOSIS — I509 Heart failure, unspecified: Secondary | ICD-10-CM | POA: Diagnosis not present

## 2020-07-18 DIAGNOSIS — G4733 Obstructive sleep apnea (adult) (pediatric): Secondary | ICD-10-CM

## 2020-07-18 DIAGNOSIS — R748 Abnormal levels of other serum enzymes: Secondary | ICD-10-CM | POA: Diagnosis not present

## 2020-07-18 DIAGNOSIS — Z79899 Other long term (current) drug therapy: Secondary | ICD-10-CM | POA: Diagnosis not present

## 2020-07-18 NOTE — Progress Notes (Addendum)
pt alone, rm 11. pt states that she had a SS 2017. she started CPAP recently a pc has broke which has not allowed her to continue the use of the CPAP.  SLEEP MEDICINE CLINIC    Provider:  Melvyn Novas, MD  Primary Care Physician:  Rodrigo Ran, MD [address] [place] [address]     Referring Provider: Rodrigo Ran, Md [address] [place],  [place] [address]          Chief Complaint according to patient   Patient presents with:    . New Patient (Initial Visit)           HISTORY OF PRESENT ILLNESS:  Katelyn Howard is a 59 year-  old African American female patient seen here in a RV on 07-18-2020,  Katelyn Howard had was not eligible yet for a new machine but her machine could be reset to her needs.  She is a CPAP dependent patient who was retested in a sleep study on 03/1619 21 and diagnosed with severe obstructive sleep apnea with an AHI of 44/h, CPAP was titrated but central apneas emerged and REM sleep did rebound.  She did best at a CPAP of 14 cm water pressure and she used a full facemask in the night but hypoxemia was still seen during the REM sleep there..  A Simplus facemask had been given to her and medium size during the sleep study.  The patient now shows me her compliance data and since mid November she has been able to use the machine much more reliably.  She still has sometimes trouble to get more than 2-1/2 hours on her machine so the average user time is only 2 hours and 41 minutes.  She has been using the machine for the last 6 days over 4 hours at night CPAP is now set to 11 cmH2O was 2 cm EPR and her residual AHI was 1.7/h.  Air leaks are moderate at 14 L/min.  She feels that this is a comfortable setting.  She endorsed the Epworth sleepiness score at 8 points out of 24 possible points and the fatigue severity score score is still high at 53 points. She still reports to snore some times, she wakes up without headaches.  Nocturia after 2-3 hours of sleep-  and sometimes not putting the machine back on.         Chief concern according to patient : she had a SS 2017. she started CPAP after a SPLIT study at [facility], 2017 , Dr. Armanda Magic . She slept not even 10 minutes on CPAP, yet was prescribed therapy!  Used Machine until May 2021  , but the mask to hose connector is now broken which has not allowed her to continue the use of the CPAP.   I have the pleasure of seeing Katelyn Howard today, a right -handed African American female with a possible sleep disorder.   She  has a past medical history of Allergy, Arthritis, Asthma, CHF (congestive heart failure) (HCC) (2008), Diabetes mellitus, Edema extremities, GERD (gastroesophageal reflux disease), Hyperlipidemia, Hypertension, Neuromuscular disorder (HCC), Obesity, OSA (obstructive sleep apnea) (10/13/2016). Not Oxygen dependent-  Asthma, bronchitis, SOB.    The patient had the first sleep study in the year 07-21-2016  with a result of an AHI ( Apnea Hypopnea index)  of 44.9/h.  Sleep relevant medical history: insomnia without CPAP since March- Nocturia 3 times. Sleep headaches. Obesity- BMI is 62, Family medical /sleep history: No other  family member on CPAP with OSA, insomnia, sleep walkers.    Social history:  Patient is disabled since 2013 , and was unemployed since 2008. She  lives in a household with spouse. Family status is married , with  2 sons, 74 and 75. Pets are  Present, one dog.  Tobacco use: never .  ETOH use ; none ,  Caffeine intake in form of Coffee( rarely) Soda(2 /week) Tea ( 1 glas a day) or energy drinks.   Sleep habits are as follows: The patient's lunch time at 12 dinner time is between 7 PM. The patient goes to bed at 1-2 PM and continues to  Struggle to sleep since CPAP is not working well- she sleep for 5-7   Hours when on CPAP. Now she wakes for3 bathroom breaks.   The preferred sleep position is laterally, , with the support of 2-3 pillows.  Dreams are  reportedly frequent .   8-9 AM is the usual rise time. She often returns to bed again until 12 noon. The patient wakes up spontaneously.  She reports not feeling refreshed or restored in AM, with symptoms such as dry mouth morning headaches, and residual fatigue.  Naps are taken frequently, lasting from 60-80 minutes and are more refreshing than nocturnal sleep.    Review of Systems: Out of a complete 14 system review, the patient complains of only the following symptoms, and all other reviewed systems are negative.:  Fatigue, sleepiness , snoring, fragmented sleep, Insomnia - since without her CPAP.    How likely are you to doze in the following situations: 0 = not likely, 1 = slight chance, 2 = moderate chance, 3 = high chance   Sitting and Reading? Watching Television? Sitting inactive in a public place (theater or meeting)? As a passenger in a car for an hour without a break? Lying down in the afternoon when circumstances permit? Sitting and talking to someone? Sitting quietly after lunch without alcohol? In a car, while stopped for a few minutes in traffic?   Total = 16/ 24 points on CPAP - 7/ 24   FSS endorsed at 52/ 63 points.   Social History   Socioeconomic History  . Marital status: Married    Spouse name: Not on file  . Number of children: Not on file  . Years of education: Not on file  . Highest education level: Not on file  Occupational History  . Not on file  Tobacco Use  . Smoking status: Never Smoker  . Smokeless tobacco: Never Used  Substance and Sexual Activity  . Alcohol use: No  . Drug use: No  . Sexual activity: Yes    Birth control/protection: Surgical  Other Topics Concern  . Not on file  Social History Narrative  . Not on file   Social Determinants of Health   Financial Resource Strain:   . Difficulty of Paying Living Expenses: Not on file  Food Insecurity:   . Worried About Programme researcher, broadcasting/film/video in the Last Year: Not on file  . Ran Out of  Food in the Last Year: Not on file  Transportation Needs:   . Lack of Transportation (Medical): Not on file  . Lack of Transportation (Non-Medical): Not on file  Physical Activity:   . Days of Exercise per Week: Not on file  . Minutes of Exercise per Session: Not on file  Stress:   . Feeling of Stress : Not on file  Social Connections:   . Frequency  of Communication with Friends and Family: Not on file  . Frequency of Social Gatherings with Friends and Family: Not on file  . Attends Religious Services: Not on file  . Active Member of Clubs or Organizations: Not on file  . Attends Banker Meetings: Not on file  . Marital Status: Not on file    Family History  Problem Relation Age of Onset  . Stroke Mother   . Hypertension Father   . Arthritis Father   . Gout Father   . Hypertension Sister   . Diabetes Sister   . Heart attack Sister   . Diabetes Brother   . Stroke Brother   . Hypertension Brother   . Colon cancer Neg Hx   . Colon polyps Neg Hx   . Rectal cancer Neg Hx   . Stomach cancer Neg Hx     Past Medical History:  Diagnosis Date  . Allergy   . Arthritis   . Asthma   . CHF (congestive heart failure) (HCC) 2008  . Diabetes mellitus   . Edema extremities   . GERD (gastroesophageal reflux disease)   . Hyperlipidemia   . Hypertension   . Neuromuscular disorder (HCC)    neuropathy  . Obesity   . OSA (obstructive sleep apnea) 10/13/2016   Severe with AHI 44.9/hr now on CPAP at 11cm H2O  . Sleep apnea    wears cpap     Past Surgical History:  Procedure Laterality Date  . ABDOMINAL HYSTERECTOMY    . EYE SURGERY       Current Outpatient Medications on File Prior to Visit  Medication Sig Dispense Refill  . acetaminophen (TYLENOL) 500 MG tablet Take 1 tablet (500 mg total) by mouth every 6 (six) hours as needed. 30 tablet 0  . albuterol (PROVENTIL HFA;VENTOLIN HFA) 108 (90 BASE) MCG/ACT inhaler Inhale 2 puffs into the lungs every 6 (six) hours as  needed. For wheezing    . amLODipine (NORVASC) 5 MG tablet Take 5 mg by mouth daily.    Marland Kitchen aspirin EC 81 MG tablet Take 81 mg by mouth daily.    Marland Kitchen atorvastatin (LIPITOR) 80 MG tablet Take 80 mg by mouth every morning.     . Cholecalciferol 50000 units capsule Take 50,000 Units by mouth every Thursday.     . cloNIDine (CATAPRES) 0.1 MG tablet Take 1 tablet (0.1 mg total) by mouth 2 (two) times daily. 60 tablet 11  . clotrimazole (LOTRIMIN) 1 % cream Apply 1 application topically daily as needed (for rash).    . clotrimazole-betamethasone (LOTRISONE) cream Apply 1 application topically 2 (two) times daily as needed (for rash).    Marland Kitchen esomeprazole (NEXIUM) 40 MG capsule Take 40 mg by mouth 2 (two) times daily before a meal.     . fluticasone (CUTIVATE) 0.05 % cream Apply topically 2 (two) times daily. 30 g 1  . fluticasone (FLONASE) 50 MCG/ACT nasal spray Place 1 spray into both nostrils 2 (two) times daily.    . folic acid (FOLVITE) 400 MCG tablet Take 400 mcg by mouth daily.    . furosemide (LASIX) 20 MG tablet Take 20 mg by mouth daily.    Marland Kitchen glimepiride (AMARYL) 4 MG tablet Take 4 mg by mouth daily with breakfast.     . HYDROcodone-acetaminophen (NORCO/VICODIN) 5-325 MG tablet Take 1 tablet by mouth as directed.    . hydrOXYzine (ATARAX/VISTARIL) 25 MG tablet Take 1 tablet (25 mg total) by mouth every 6 (six) hours. Prn  itching 20 tablet 1  . lisinopril-hydrochlorothiazide (PRINZIDE,ZESTORETIC) 20-25 MG tablet Take 1 tablet by mouth daily.    . methocarbamol (ROBAXIN) 500 MG tablet Take 1 tablet by mouth daily.    . nebivolol (BYSTOLIC) 10 MG tablet Take 10 mg by mouth daily.    . potassium chloride (K-DUR) 10 MEQ tablet Take 10 mEq by mouth daily.     . TRULICITY 1.5 MG/0.5ML SOPN Inject 1.5 mg as directed every Wednesday.      No current facility-administered medications on file prior to visit.    Allergies  Allergen Reactions  . Shrimp [Shellfish Allergy] Anaphylaxis  . Tetracycline  Nausea And Vomiting  . Penicillins Itching, Swelling and Other (See Comments)    Has patient had a PCN reaction causing immediate rash, facial/tongue/throat swelling, SOB or lightheadedness with hypotension: Unknown Has patient had a PCN reaction causing severe rash involving mucus membranes or skin necrosis: Unknown Has patient had a PCN reaction that required hospitalization: Unknown Has patient had a PCN reaction occurring within the last 10 years: No If all of the above answers are "NO", then may proceed with Cephalosporin use.     Physical exam:  Today's Vitals   07/18/20 1034  BP: (!) 158/86  Pulse: 72  Weight: (!) 329 lb (149.2 kg)  Height: 5\' 3"  (1.6 m)   Body mass index is 58.28 kg/m.   Wt Readings from Last 3 Encounters:  07/18/20 (!) 329 lb (149.2 kg)  03/22/20 (!) 327 lb (148.3 kg)  11/29/19 (!) 325 lb (147.4 kg)     Ht Readings from Last 3 Encounters:  07/18/20 5\' 3"  (1.6 m)  03/22/20 5\' 1"  (1.549 m)  11/30/17 5\' 3"  (1.6 m)      General: The patient is awake, alert and appears not in acute distress. The patient is well groomed. Head: Normocephalic, atraumatic. Neck is supple. Mallampati 3 plus   neck circumference:19 inches .  Nasal airflow barely patent.  Retrognathia is seen.   Cardiovascular:  Regular rate and cardiac rhythm by pulse,  without distended neck veins. Respiratory: Lungs are clear to auscultation.  Skin:  With  evidence of severe ankle edema, and still with finger swelling,  Trunk: morbidly obese.    Neurologic exam : The patient is awake and alert, oriented to place and time.   Memory subjective described as intact.  Attention span & concentration ability appears normal.  Speech is fluent,  with dysarthria, dysphonia , not  aphasia.  Mood and affect are appropriate.   Cranial nerves: no loss of smell or taste reported - Pupils are equal and briskly reactive to light. Funduscopic exam deferred.   Extraocular movements in vertical and  horizontal planes were intact and without nystagmus. No Diplopia. Facial motor strength is symmetric and tongue move midline.  Neck ROM : rotation, tilt and flexion extension were normal for age and shoulder shrug was symmetrical.    Motor exam:  Symmetric bulk, tone and ROM.   Normal tone without cog- wheeling, symmetric grip strength .   Sensory:  tingling in fingers and feet- pinprick and vibration were normal here on fingers and knees, but her ankles are too swollen to feel.  .  Proprioception tested in the upper extremities was normal.   Coordination: Rapid alternating movements in the fingers/hands were of normal speed.  The Finger-to-nose maneuver was intact without evidence of ataxia, dysmetria or tremor.   Gait and station: Patient could rise assisted from a seated position, braces herself - walked  with a cane as  assistive device.  Stance is of normal width/ base and the patient turned with 4 steps. She seems stooped and h is SOB while walking slowly, leaning on her cane.  Toe and heel walk were deferred.  Deep tendon reflexes: in the upper and lower extremities are symmetrically attenuated. Trace only -  Babinski response was deferred .      After spending a total time of 20 minutes face to face and additional time for physical and neurologic examination, review of laboratory studies,  personal review of imaging studies, reports and results of other testing and review of referral information / records as far as provided in visit, I have established the following assessments:   My Plan is to proceed with:  17) 59 year old machine was reset to 11 cm water and new connector to her tubing and machine was given to her- she has now reduced the AHI o form 44/h to 1.7/h. her Epworth score is 7 down from 16/ 24 points !   2)  I will follow adapt ( formerly known as AHC) and I will reserve a Rv in 12 month to allow compliance check up.  She hopefully can achieve restfull sleep and better  control of asthma and HTN, and weight loss .  This patient is at risk for hypoventilation and for central apnea, being on opiate pain medication.   I would like to thank  Rodrigo Ran, Md [address] [place],  [place] [address] for allowing me to meet with and to take care of this pleasant patient.   In short, Katelyn Howard is presenting as a CPAP dependent patient who has  gotten better sleep since her adapter was replaced. .   CC: I will share my notes with PCP.  Electronically signed by: Melvyn Novas, MD 07/18/2020 11:06 AM  [facility] Board certified by The [employer] of Sleep Medicine and Diplomate of the [employer] of Sleep Medicine. Board certified In Neurology through the ABPN, Fellow of the [employer] of Neurology. Medical Director of [facility].

## 2020-07-18 NOTE — Patient Instructions (Signed)

## 2020-07-30 DIAGNOSIS — J209 Acute bronchitis, unspecified: Secondary | ICD-10-CM | POA: Diagnosis not present

## 2020-08-02 DIAGNOSIS — G4733 Obstructive sleep apnea (adult) (pediatric): Secondary | ICD-10-CM | POA: Diagnosis not present

## 2020-08-30 DIAGNOSIS — J209 Acute bronchitis, unspecified: Secondary | ICD-10-CM | POA: Diagnosis not present

## 2020-09-30 DIAGNOSIS — J209 Acute bronchitis, unspecified: Secondary | ICD-10-CM | POA: Diagnosis not present

## 2020-10-01 DIAGNOSIS — E1169 Type 2 diabetes mellitus with other specified complication: Secondary | ICD-10-CM | POA: Diagnosis not present

## 2020-10-01 DIAGNOSIS — E785 Hyperlipidemia, unspecified: Secondary | ICD-10-CM | POA: Diagnosis not present

## 2020-10-08 DIAGNOSIS — R82998 Other abnormal findings in urine: Secondary | ICD-10-CM | POA: Diagnosis not present

## 2020-10-08 DIAGNOSIS — G4733 Obstructive sleep apnea (adult) (pediatric): Secondary | ICD-10-CM | POA: Diagnosis not present

## 2020-10-08 DIAGNOSIS — M1990 Unspecified osteoarthritis, unspecified site: Secondary | ICD-10-CM | POA: Diagnosis not present

## 2020-10-08 DIAGNOSIS — Z0000 Encounter for general adult medical examination without abnormal findings: Secondary | ICD-10-CM | POA: Diagnosis not present

## 2020-10-08 DIAGNOSIS — E785 Hyperlipidemia, unspecified: Secondary | ICD-10-CM | POA: Diagnosis not present

## 2020-10-08 DIAGNOSIS — M069 Rheumatoid arthritis, unspecified: Secondary | ICD-10-CM | POA: Diagnosis not present

## 2020-10-08 DIAGNOSIS — I509 Heart failure, unspecified: Secondary | ICD-10-CM | POA: Diagnosis not present

## 2020-10-08 DIAGNOSIS — Z23 Encounter for immunization: Secondary | ICD-10-CM | POA: Diagnosis not present

## 2020-10-08 DIAGNOSIS — I10 Essential (primary) hypertension: Secondary | ICD-10-CM | POA: Diagnosis not present

## 2020-10-08 DIAGNOSIS — E1169 Type 2 diabetes mellitus with other specified complication: Secondary | ICD-10-CM | POA: Diagnosis not present

## 2020-10-10 DIAGNOSIS — J449 Chronic obstructive pulmonary disease, unspecified: Secondary | ICD-10-CM | POA: Diagnosis not present

## 2020-10-15 DIAGNOSIS — R3121 Asymptomatic microscopic hematuria: Secondary | ICD-10-CM | POA: Diagnosis not present

## 2020-10-15 DIAGNOSIS — M5136 Other intervertebral disc degeneration, lumbar region: Secondary | ICD-10-CM | POA: Diagnosis not present

## 2020-10-15 DIAGNOSIS — N390 Urinary tract infection, site not specified: Secondary | ICD-10-CM | POA: Diagnosis not present

## 2020-10-15 DIAGNOSIS — R945 Abnormal results of liver function studies: Secondary | ICD-10-CM | POA: Diagnosis not present

## 2020-10-15 DIAGNOSIS — M461 Sacroiliitis, not elsewhere classified: Secondary | ICD-10-CM | POA: Diagnosis not present

## 2020-10-15 DIAGNOSIS — M549 Dorsalgia, unspecified: Secondary | ICD-10-CM | POA: Diagnosis not present

## 2020-10-15 DIAGNOSIS — R109 Unspecified abdominal pain: Secondary | ICD-10-CM | POA: Diagnosis not present

## 2020-10-25 DIAGNOSIS — S338XXD Sprain of other parts of lumbar spine and pelvis, subsequent encounter: Secondary | ICD-10-CM | POA: Diagnosis not present

## 2020-10-25 DIAGNOSIS — R262 Difficulty in walking, not elsewhere classified: Secondary | ICD-10-CM | POA: Diagnosis not present

## 2020-10-28 DIAGNOSIS — J209 Acute bronchitis, unspecified: Secondary | ICD-10-CM | POA: Diagnosis not present

## 2020-11-01 LAB — COLOGUARD

## 2020-11-02 DIAGNOSIS — S338XXD Sprain of other parts of lumbar spine and pelvis, subsequent encounter: Secondary | ICD-10-CM | POA: Diagnosis not present

## 2020-11-02 DIAGNOSIS — R262 Difficulty in walking, not elsewhere classified: Secondary | ICD-10-CM | POA: Diagnosis not present

## 2020-11-05 DIAGNOSIS — J449 Chronic obstructive pulmonary disease, unspecified: Secondary | ICD-10-CM | POA: Diagnosis not present

## 2020-11-05 DIAGNOSIS — R262 Difficulty in walking, not elsewhere classified: Secondary | ICD-10-CM | POA: Diagnosis not present

## 2020-11-05 DIAGNOSIS — S338XXD Sprain of other parts of lumbar spine and pelvis, subsequent encounter: Secondary | ICD-10-CM | POA: Diagnosis not present

## 2020-11-08 DIAGNOSIS — G4733 Obstructive sleep apnea (adult) (pediatric): Secondary | ICD-10-CM | POA: Diagnosis not present

## 2020-11-09 DIAGNOSIS — R262 Difficulty in walking, not elsewhere classified: Secondary | ICD-10-CM | POA: Diagnosis not present

## 2020-11-09 DIAGNOSIS — S335XXD Sprain of ligaments of lumbar spine, subsequent encounter: Secondary | ICD-10-CM | POA: Diagnosis not present

## 2020-11-12 DIAGNOSIS — R262 Difficulty in walking, not elsewhere classified: Secondary | ICD-10-CM | POA: Diagnosis not present

## 2020-11-12 DIAGNOSIS — S338XXD Sprain of other parts of lumbar spine and pelvis, subsequent encounter: Secondary | ICD-10-CM | POA: Diagnosis not present

## 2020-11-14 DIAGNOSIS — R262 Difficulty in walking, not elsewhere classified: Secondary | ICD-10-CM | POA: Diagnosis not present

## 2020-11-14 DIAGNOSIS — S338XXD Sprain of other parts of lumbar spine and pelvis, subsequent encounter: Secondary | ICD-10-CM | POA: Diagnosis not present

## 2020-11-19 DIAGNOSIS — R262 Difficulty in walking, not elsewhere classified: Secondary | ICD-10-CM | POA: Diagnosis not present

## 2020-11-19 DIAGNOSIS — S338XXD Sprain of other parts of lumbar spine and pelvis, subsequent encounter: Secondary | ICD-10-CM | POA: Diagnosis not present

## 2020-11-22 DIAGNOSIS — R262 Difficulty in walking, not elsewhere classified: Secondary | ICD-10-CM | POA: Diagnosis not present

## 2020-11-22 DIAGNOSIS — S338XXD Sprain of other parts of lumbar spine and pelvis, subsequent encounter: Secondary | ICD-10-CM | POA: Diagnosis not present

## 2020-11-28 DIAGNOSIS — J209 Acute bronchitis, unspecified: Secondary | ICD-10-CM | POA: Diagnosis not present

## 2020-12-12 DIAGNOSIS — S338XXD Sprain of other parts of lumbar spine and pelvis, subsequent encounter: Secondary | ICD-10-CM | POA: Diagnosis not present

## 2020-12-12 DIAGNOSIS — R262 Difficulty in walking, not elsewhere classified: Secondary | ICD-10-CM | POA: Diagnosis not present

## 2020-12-18 DIAGNOSIS — S338XXD Sprain of other parts of lumbar spine and pelvis, subsequent encounter: Secondary | ICD-10-CM | POA: Diagnosis not present

## 2020-12-18 DIAGNOSIS — R262 Difficulty in walking, not elsewhere classified: Secondary | ICD-10-CM | POA: Diagnosis not present

## 2020-12-21 DIAGNOSIS — R262 Difficulty in walking, not elsewhere classified: Secondary | ICD-10-CM | POA: Diagnosis not present

## 2020-12-21 DIAGNOSIS — S338XXD Sprain of other parts of lumbar spine and pelvis, subsequent encounter: Secondary | ICD-10-CM | POA: Diagnosis not present

## 2020-12-28 DIAGNOSIS — J209 Acute bronchitis, unspecified: Secondary | ICD-10-CM | POA: Diagnosis not present

## 2021-01-07 DIAGNOSIS — J449 Chronic obstructive pulmonary disease, unspecified: Secondary | ICD-10-CM | POA: Diagnosis not present

## 2021-01-28 DIAGNOSIS — J209 Acute bronchitis, unspecified: Secondary | ICD-10-CM | POA: Diagnosis not present

## 2021-02-19 DIAGNOSIS — E785 Hyperlipidemia, unspecified: Secondary | ICD-10-CM | POA: Diagnosis not present

## 2021-02-19 DIAGNOSIS — U071 COVID-19: Secondary | ICD-10-CM | POA: Diagnosis not present

## 2021-02-19 DIAGNOSIS — G609 Hereditary and idiopathic neuropathy, unspecified: Secondary | ICD-10-CM | POA: Diagnosis not present

## 2021-02-19 DIAGNOSIS — J45909 Unspecified asthma, uncomplicated: Secondary | ICD-10-CM | POA: Diagnosis not present

## 2021-02-19 DIAGNOSIS — E11621 Type 2 diabetes mellitus with foot ulcer: Secondary | ICD-10-CM | POA: Diagnosis not present

## 2021-02-19 DIAGNOSIS — I509 Heart failure, unspecified: Secondary | ICD-10-CM | POA: Diagnosis not present

## 2021-02-19 DIAGNOSIS — R600 Localized edema: Secondary | ICD-10-CM | POA: Diagnosis not present

## 2021-02-19 DIAGNOSIS — I10 Essential (primary) hypertension: Secondary | ICD-10-CM | POA: Diagnosis not present

## 2021-02-27 DIAGNOSIS — J209 Acute bronchitis, unspecified: Secondary | ICD-10-CM | POA: Diagnosis not present

## 2021-03-01 DIAGNOSIS — J449 Chronic obstructive pulmonary disease, unspecified: Secondary | ICD-10-CM | POA: Diagnosis not present

## 2021-03-01 DIAGNOSIS — J209 Acute bronchitis, unspecified: Secondary | ICD-10-CM | POA: Diagnosis not present

## 2021-03-28 ENCOUNTER — Ambulatory Visit: Payer: Medicare HMO | Admitting: Podiatry

## 2021-03-28 DIAGNOSIS — G4733 Obstructive sleep apnea (adult) (pediatric): Secondary | ICD-10-CM | POA: Diagnosis not present

## 2021-03-29 ENCOUNTER — Ambulatory Visit
Admission: EM | Admit: 2021-03-29 | Discharge: 2021-03-29 | Disposition: A | Discharge status: Home / Self Care | Payer: Medicare HMO | Attending: Internal Medicine | Admitting: Internal Medicine

## 2021-03-29 ENCOUNTER — Other Ambulatory Visit: Payer: Self-pay

## 2021-03-29 ENCOUNTER — Ambulatory Visit (INDEPENDENT_AMBULATORY_CARE_PROVIDER_SITE_OTHER): Payer: Medicare HMO

## 2021-03-29 DIAGNOSIS — M19042 Primary osteoarthritis, left hand: Secondary | ICD-10-CM | POA: Diagnosis not present

## 2021-03-29 DIAGNOSIS — M79645 Pain in left finger(s): Secondary | ICD-10-CM

## 2021-03-29 DIAGNOSIS — M7989 Other specified soft tissue disorders: Secondary | ICD-10-CM | POA: Diagnosis not present

## 2021-03-29 MED ORDER — PREDNISONE 10 MG PO TABS: 20.0000 mg | ORAL_TABLET | Freq: Every day | ORAL | 0 refills | Status: AC

## 2021-03-29 NOTE — ED Triage Notes (Signed)
Pt c/o 3rd digit left finger swelling onset x1 week ago. Describes 9/10 throbbing shooting pain. States no external injury occurred that she remembers. States tried tylenol at home without relief.

## 2021-03-29 NOTE — ED Provider Notes (Signed)
[facility]    CSN: [id number] Arrival date & time: 03/29/21  1841      History   Chief Complaint Chief Complaint  Patient presents with   left finger pain    HPI Katelyn Howard is a 60 y.o. female.   Patient presents with left third digit pain that started approximately 1 week ago.  Denies any known injury or past injury.  Does have history of arthritis in multiple joints throughout the body but has never had flareup in hands or fingers.  States that pain is rated 9 out of 10 and is described as a "throbbing and shooting" pain.  Denies any numbness or tingling in finger.  Has taken Tylenol at home with no relief of symptoms.  Patient has been told that she is not allowed to take any over-the-counter NSAIDs by PCP.    Past Medical History:  Diagnosis Date   Allergy    Arthritis    Asthma    CHF (congestive heart failure) (HCC) 2008   Diabetes mellitus    Edema extremities    GERD (gastroesophageal reflux disease)    Hyperlipidemia    Hypertension    Neuromuscular disorder (HCC)    neuropathy   Obesity    OSA (obstructive sleep apnea) 10/13/2016   Severe with AHI 44.9/hr now on CPAP at 11cm H2O   Sleep apnea    wears cpap     Patient Active Problem List   Diagnosis Date Noted   Intermittent asthma 03/22/2020   Hypertension 03/22/2020   Super obesity 03/22/2020   Dependence on CPAP ventilation 03/22/2020   Obesity hypoventilation syndrome (HCC) 03/22/2020   At risk for central sleep apnea 03/22/2020   OSA (obstructive sleep apnea) 10/13/2016   Excessive daytime sleepiness 06/04/2016   Gasping for breath 06/04/2016   DM, UNCOMPLICATED, TYPE II, UNCONTROLLED 02/12/2007   HYPERCHOLESTEROLEMIA, PURE 02/12/2007   Obesity 02/12/2007   HYPERTENSION, BENIGN ESSENTIAL, CONTROLLED 02/12/2007   ALLERGIC RHINITIS 02/12/2007   ASTHMA, CHILDHOOD 02/12/2007   DEGENERATIVE JOINT DISEASE 02/12/2007    Past Surgical History:  Procedure Laterality Date    ABDOMINAL HYSTERECTOMY     EYE SURGERY      OB History   No obstetric history on file.      Home Medications    Prior to Admission medications   Medication Sig Start Date End Date Taking? Authorizing Provider  predniSONE (DELTASONE) 10 MG tablet Take 2 tablets (20 mg total) by mouth daily for 5 days. 03/29/21 04/03/21 Yes Lance Muss, FNP  acetaminophen (TYLENOL) 500 MG tablet Take 1 tablet (500 mg total) by mouth every 6 (six) hours as needed. 05/04/15   Dowless, Lelon Mast Tripp, PA-C  albuterol (PROVENTIL HFA;VENTOLIN HFA) 108 (90 BASE) MCG/ACT inhaler Inhale 2 puffs into the lungs every 6 (six) hours as needed. For wheezing    [provider]  amLODipine (NORVASC) 5 MG tablet Take 5 mg by mouth daily.    [provider]  aspirin EC 81 MG tablet Take 81 mg by mouth daily.    [provider]  atorvastatin (LIPITOR) 80 MG tablet Take 80 mg by mouth every morning.  04/25/15   [provider]  Cholecalciferol 50000 units capsule Take 50,000 Units by mouth every Thursday.     [provider]  cloNIDine (CATAPRES) 0.1 MG tablet Take 1 tablet (0.1 mg total) by mouth 2 (two) times daily. 03/08/16   Blane Ohara, MD  clotrimazole (LOTRIMIN) 1 % cream Apply 1  application topically daily as needed (for rash).    [provider]  clotrimazole-betamethasone (LOTRISONE) cream Apply 1 application topically 2 (two) times daily as needed (for rash).    [provider]  esomeprazole (NEXIUM) 40 MG capsule Take 40 mg by mouth 2 (two) times daily before a meal.  04/25/15   [provider]  fluticasone (CUTIVATE) 0.05 % cream Apply topically 2 (two) times daily. 04/23/15   Linna Hoff, MD  fluticasone (FLONASE) 50 MCG/ACT nasal spray Place 1 spray into both nostrils 2 (two) times daily. 05/13/16   [provider]  folic acid (FOLVITE) 400 MCG tablet Take 400 mcg by mouth daily.    [provider]  furosemide (LASIX) 20 MG  tablet Take 20 mg by mouth daily.    [provider]  glimepiride (AMARYL) 4 MG tablet Take 4 mg by mouth daily with breakfast.  04/25/15   [provider]  HYDROcodone-acetaminophen (NORCO/VICODIN) 5-325 MG tablet Take 1 tablet by mouth as directed. 05/22/16   [provider]  hydrOXYzine (ATARAX/VISTARIL) 25 MG tablet Take 1 tablet (25 mg total) by mouth every 6 (six) hours. Prn itching 04/23/15   Linna Hoff, MD  lisinopril-hydrochlorothiazide (PRINZIDE,ZESTORETIC) 20-25 MG tablet Take 1 tablet by mouth daily. 05/13/16   [provider]  methocarbamol (ROBAXIN) 500 MG tablet Take 1 tablet by mouth daily. 05/21/16   [provider]  nebivolol (BYSTOLIC) 10 MG tablet Take 10 mg by mouth daily.    [provider]  potassium chloride (K-DUR) 10 MEQ tablet Take 10 mEq by mouth daily.     [provider]  TRULICITY 1.5 MG/0.5ML SOPN Inject 1.5 mg as directed every Wednesday.  05/26/16   [provider]    Family History Family History  Problem Relation Age of Onset   Stroke Mother    Hypertension Father    Arthritis Father    Gout Father    Hypertension Sister    Diabetes Sister    Heart attack Sister    Diabetes Brother    Stroke Brother    Hypertension Brother    Colon cancer Neg Hx    Colon polyps Neg Hx    Rectal cancer Neg Hx    Stomach cancer Neg Hx     Social History Social History   Tobacco Use   Smoking status: Never   Smokeless tobacco: Never  Substance Use Topics   Alcohol use: No   Drug use: No     Allergies   Shrimp [shellfish allergy], Tetracycline, and Penicillins   Review of Systems Review of Systems Per HPI  Physical Exam Triage Vital Signs ED Triage Vitals  Enc Vitals Group     BP 03/29/21 1931 (!) 164/95     Pulse Rate 03/29/21 1931 72     Resp 03/29/21 1931 18     Temp 03/29/21 1931 98 F (36.7 C)     Temp Source 03/29/21 1931 Oral     SpO2 03/29/21 1931 100 %     Weight --       Height --      Head Circumference --      Peak Flow --      Pain Score 03/29/21 1932 9     Pain Loc --      Pain Edu? --      Excl. in GC? --    No data found.  Updated Vital Signs BP (!) 164/95 (BP Location: Left Arm)  Pulse 72   Temp 98 F (36.7 C) (Oral)   Resp 18   SpO2 100%   Visual Acuity Right Eye Distance:   Left Eye Distance:   Bilateral Distance:    Right Eye Near:   Left Eye Near:    Bilateral Near:     Physical Exam Constitutional:      Appearance: Normal appearance.  HENT:     Head: Normocephalic and atraumatic.  Eyes:     Extraocular Movements: Extraocular movements intact.     Conjunctiva/sclera: Conjunctivae normal.  Pulmonary:     Effort: Pulmonary effort is normal.  Musculoskeletal:     Right hand: Normal.     Left hand: Tenderness and bony tenderness present. Decreased strength. Normal strength of finger abduction. Normal capillary refill. Normal pulse.     Comments: Tenderness to palpation throughout left third digit on dorsal and palmar surface of finger.  Bony tenderness present.  Patient has decreased grip strength due to pain and is not able to flex finger due to pain.  Neurovascular intact.  Neurological:     General: No focal deficit present.     Mental Status: She is alert and oriented to person, place, and time. Mental status is at baseline.  Psychiatric:        Mood and Affect: Mood normal.        Behavior: Behavior normal.        Thought Content: Thought content normal.        Judgment: Judgment normal.     UC Treatments / Results  Labs (all labs ordered are listed, but only abnormal results are displayed) Labs Reviewed - No data to display  EKG   Radiology DG Finger Middle Left  Result Date: 03/29/2021 CLINICAL DATA:  Atraumatic left middle finger pain x2 weeks. EXAM: LEFT MIDDLE FINGER 2+V COMPARISON:  None. FINDINGS: There is no evidence of an acute fracture or dislocation. Very mild degenerative changes are seen  involving the DIP joint. Mild to moderate severity diffuse soft tissue swelling is noted. IMPRESSION: Diffuse soft tissue swelling without an acute osseous abnormality. Electronically Signed   By: Aram Candela M.D.   On: 03/29/2021 20:00    Procedures Procedures (including critical care time)  Medications Ordered in UC Medications - No data to display  Initial Impression / Assessment and Plan / UC Course  I have reviewed the triage vital signs and the nursing notes.  Pertinent labs & imaging results that were available during my care of the patient were reviewed by me and considered in my medical decision making (see chart for details).     X-ray of left third digit was negative for any acute bony abnormality.  X-ray did show signs of degenerative changes.  Suspect arthritis as cause of left finger pain.  Tylenol has not provided relief for patient.  Patient is not able to take NSAIDs per PCP.  Patient has safely taken prednisone in the past.  Will treat with low-dose prednisone for 5 days.  Advised patient to monitor blood glucose very closely and at least twice daily.  Advised patient to stop prednisone if blood glucose is elevated above 250.  Patient provided contact information with orthopedist to follow-up if pain persists with current treatment plan.Discussed strict return precautions. Patient verbalized understanding and is agreeable with plan.  Final Clinical Impressions(s) / UC Diagnoses   Final diagnoses:  Finger pain, left     Discharge Instructions      Your x-ray was negative for  fracture but did show signs of possible arthritis.  Will treat with low-dose prednisone for 5 days.  Please monitor blood glucose closely and at least twice daily.  Stop prednisone steroid if blood sugar is elevated above 250.  Please follow-up with orthopedic sports medicine provided contact information if pain persists in the next 1 to 2 weeks.     ED Prescriptions     Medication Sig  Dispense Auth. Provider   predniSONE (DELTASONE) 10 MG tablet Take 2 tablets (20 mg total) by mouth daily for 5 days. 10 tablet Lance Muss, FNP      PDMP not reviewed this encounter.   Lance Muss, FNP 03/29/21 2014

## 2021-03-29 NOTE — Discharge Instructions (Addendum)
Your x-ray was negative for fracture but did show signs of possible arthritis.  Will treat with low-dose prednisone for 5 days.  Please monitor blood glucose closely and at least twice daily.  Stop prednisone steroid if blood sugar is elevated above 250.  Please follow-up with orthopedic sports medicine provided contact information if pain persists in the next 1 to 2 weeks.

## 2021-04-03 ENCOUNTER — Ambulatory Visit (INDEPENDENT_AMBULATORY_CARE_PROVIDER_SITE_OTHER): Payer: Medicare HMO | Admitting: Podiatry

## 2021-04-03 ENCOUNTER — Other Ambulatory Visit: Payer: Self-pay

## 2021-04-03 DIAGNOSIS — L309 Dermatitis, unspecified: Secondary | ICD-10-CM | POA: Diagnosis not present

## 2021-04-03 DIAGNOSIS — M79675 Pain in left toe(s): Secondary | ICD-10-CM | POA: Diagnosis not present

## 2021-04-03 DIAGNOSIS — B351 Tinea unguium: Secondary | ICD-10-CM

## 2021-04-03 DIAGNOSIS — M79674 Pain in right toe(s): Secondary | ICD-10-CM

## 2021-04-03 MED ORDER — TERBINAFINE HCL 250 MG PO TABS: 250.0000 mg | ORAL_TABLET | Freq: Every day | ORAL | 0 refills | Status: DC

## 2021-04-04 NOTE — Progress Notes (Signed)
Subjective:   Patient ID: Katelyn Howard, female   DOB: 60 y.o.   MRN: [id number]   HPI Patient presents stating that she has very bad now she cannot take care of her self and she is tried to do this as best as possible but obesity makes it difficult and she cannot see them well and does have diabetes   Review of Systems  All other systems reviewed and are negative.      Objective:  Physical Exam Vitals and nursing note reviewed.  Constitutional:      Appearance: She is well-developed.  Pulmonary:     Effort: Pulmonary effort is normal.  Musculoskeletal:        General: Normal range of motion.  Skin:    General: Skin is warm.  Neurological:     Mental Status: She is alert.    Neurovascular status is mildly reduced with diminished PT DP pulses and edema in the ankle region bilateral secondary to obesity with thick yellow brittle nailbeds 1-5 both feet she cannot cut and are painful.  Good digital perfusion well oriented     Assessment:  Chronic mycotic nail infection with poorly controlled diabetic with obesity and venous disease     Plan:  H&P discussed condition and her swelling and I went ahead today debrided nailbeds 1-5 both feet discussed daily inspections that she does need caregiver to help her do this.  Reappoint as needed

## 2021-04-16 DIAGNOSIS — J449 Chronic obstructive pulmonary disease, unspecified: Secondary | ICD-10-CM | POA: Diagnosis not present

## 2021-05-16 DIAGNOSIS — J449 Chronic obstructive pulmonary disease, unspecified: Secondary | ICD-10-CM | POA: Diagnosis not present

## 2021-06-28 DIAGNOSIS — J449 Chronic obstructive pulmonary disease, unspecified: Secondary | ICD-10-CM | POA: Diagnosis not present

## 2021-07-08 ENCOUNTER — Ambulatory Visit: Payer: Medicare HMO | Admitting: Podiatry

## 2021-07-23 DIAGNOSIS — J449 Chronic obstructive pulmonary disease, unspecified: Secondary | ICD-10-CM | POA: Diagnosis not present

## 2021-07-23 NOTE — Progress Notes (Addendum)
PATIENT: Katelyn Howard DOB: [birth year]  REASON FOR VISIT: follow up HISTORY FROM: patient PRIMARY NEUROLOGIST:   HISTORY OF PRESENT ILLNESS: Today 07/24/21:  Katelyn Howard is a 60 year old female with a history of obstructive sleep apnea on CPAP.  She returns today for follow-up.  She reports that the CPAP is working well for her.  She denies any new issues.  She does report that she needs new straps.  She states that she misplaced hers and is currently using a string to hold her CPAP mask on.    HISTORY: (Copied from Dr.Dohmeier's note) Katelyn Howard had was not eligible yet for a new machine but her machine could be reset to her needs.  She is a CPAP dependent patient who was retested in a sleep study on 03/1619 21 and diagnosed with severe obstructive sleep apnea with an AHI of 44/h, CPAP was titrated but central apneas emerged and REM sleep did rebound.  She did best at a CPAP of 14 cm water pressure and she used a full facemask in the night but hypoxemia was still seen during the REM sleep there..  A Simplus facemask had been given to her and medium size during the sleep study.  The patient now shows me her compliance data and since mid November she has been able to use the machine much more reliably.  She still has sometimes trouble to get more than 2-1/2 hours on her machine so the average user time is only 2 hours and 41 minutes.  She has been using the machine for the last 6 days over 4 hours at night CPAP is now set to 11 cmH2O was 2 cm EPR and her residual AHI was 1.7/h.  Air leaks are moderate at 14 L/min.  She feels that this is a comfortable setting.  She endorsed the Epworth sleepiness score at 8 points out of 24 possible points and the fatigue severity score score is still high at 53 points. She still reports to snore some times, she wakes up without headaches.  Nocturia after 2-3 hours of sleep- and sometimes not putting the machine back on.   REVIEW OF SYSTEMS: Out of a  complete 14 system review of symptoms, the patient complains only of the following symptoms, and all other reviewed systems are negative.   ESS 6  ALLERGIES: Allergies  Allergen Reactions   Shrimp [Shellfish Allergy] Anaphylaxis   Tetracycline Nausea And Vomiting   Penicillins Itching, Swelling and Other (See Comments)    Has patient had a PCN reaction causing immediate rash, facial/tongue/throat swelling, SOB or lightheadedness with hypotension: Unknown Has patient had a PCN reaction causing severe rash involving mucus membranes or skin necrosis: Unknown Has patient had a PCN reaction that required hospitalization: Unknown Has patient had a PCN reaction occurring within the last 10 years: No If all of the above answers are "NO", then may proceed with Cephalosporin use.     HOME MEDICATIONS: Outpatient Medications Prior to Visit  Medication Sig Dispense Refill   acetaminophen (TYLENOL) 500 MG tablet Take 1 tablet (500 mg total) by mouth every 6 (six) hours as needed. 30 tablet 0   albuterol (PROVENTIL HFA;VENTOLIN HFA) 108 (90 BASE) MCG/ACT inhaler Inhale 2 puffs into the lungs every 6 (six) hours as needed. For wheezing     amLODipine (NORVASC) 5 MG tablet Take 5 mg by mouth daily.     aspirin EC 81 MG tablet Take 81 mg by mouth daily.     atorvastatin (  LIPITOR) 80 MG tablet Take 80 mg by mouth every morning.      Cholecalciferol 50000 units capsule Take 50,000 Units by mouth every Thursday.      cloNIDine (CATAPRES) 0.1 MG tablet Take 1 tablet (0.1 mg total) by mouth 2 (two) times daily. 60 tablet 11   clotrimazole (LOTRIMIN) 1 % cream Apply 1 application topically daily as needed (for rash).     clotrimazole-betamethasone (LOTRISONE) cream Apply 1 application topically 2 (two) times daily as needed (for rash).     esomeprazole (NEXIUM) 40 MG capsule Take 40 mg by mouth 2 (two) times daily before a meal.      fluticasone (CUTIVATE) 0.05 % cream Apply topically 2 (two) times daily.  30 g 1   fluticasone (FLONASE) 50 MCG/ACT nasal spray Place 1 spray into both nostrils 2 (two) times daily.     folic acid (FOLVITE) 400 MCG tablet Take 400 mcg by mouth daily.     furosemide (LASIX) 20 MG tablet Take 20 mg by mouth daily.     glimepiride (AMARYL) 4 MG tablet Take 4 mg by mouth daily with breakfast.      HYDROcodone-acetaminophen (NORCO/VICODIN) 5-325 MG tablet Take 1 tablet by mouth as directed.     hydrOXYzine (ATARAX/VISTARIL) 25 MG tablet Take 1 tablet (25 mg total) by mouth every 6 (six) hours. Prn itching 20 tablet 1   lisinopril-hydrochlorothiazide (PRINZIDE,ZESTORETIC) 20-25 MG tablet Take 1 tablet by mouth daily.     methocarbamol (ROBAXIN) 500 MG tablet Take 1 tablet by mouth daily.     nebivolol (BYSTOLIC) 10 MG tablet Take 10 mg by mouth daily.     potassium chloride (K-DUR) 10 MEQ tablet Take 10 mEq by mouth daily.      terbinafine (LAMISIL) 250 MG tablet Take 1 tablet (250 mg total) by mouth daily. 30 tablet 0   TRULICITY 1.5 MG/0.5ML SOPN Inject 1.5 mg as directed every Wednesday.      No facility-administered medications prior to visit.    PAST MEDICAL HISTORY: Past Medical History:  Diagnosis Date   Allergy    Arthritis    Asthma    CHF (congestive heart failure) (HCC) 2008   Diabetes mellitus    Edema extremities    GERD (gastroesophageal reflux disease)    Hyperlipidemia    Hypertension    Neuromuscular disorder (HCC)    neuropathy   Obesity    OSA (obstructive sleep apnea) 10/13/2016   Severe with AHI 44.9/hr now on CPAP at 11cm H2O   Sleep apnea    wears cpap     PAST SURGICAL HISTORY: Past Surgical History:  Procedure Laterality Date   ABDOMINAL HYSTERECTOMY     EYE SURGERY      FAMILY HISTORY: Family History  Problem Relation Age of Onset   Stroke Mother    Hypertension Father    Arthritis Father    Gout Father    Hypertension Sister    Diabetes Sister    Heart attack Sister    Diabetes Brother    Stroke Brother     Hypertension Brother    Colon cancer Neg Hx    Colon polyps Neg Hx    Rectal cancer Neg Hx    Stomach cancer Neg Hx     SOCIAL HISTORY: Social History   Socioeconomic History   Marital status: Married    Spouse name: Not on file   Number of children: Not on file   Years of education: Not on file  Highest education level: Not on file  Occupational History   Not on file  Tobacco Use   Smoking status: Never   Smokeless tobacco: Never  Substance and Sexual Activity   Alcohol use: No   Drug use: No   Sexual activity: Yes    Birth control/protection: Surgical  Other Topics Concern   Not on file  Social History Narrative   Not on file   Social Determinants of Health   Financial Resource Strain: Not on file  Food Insecurity: Not on file  Transportation Needs: Not on file  Physical Activity: Not on file  Stress: Not on file  Social Connections: Not on file  Intimate Partner Violence: Not on file      PHYSICAL EXAM  Vitals:   07/24/21 1038  BP: (!) 144/75  Pulse: 69  Weight: (!) 341 lb 6.4 oz (154.9 kg)  Height: 5\' 3"  (1.6 m)   Body mass index is 60.48 kg/m.  Generalized: Well developed, in no acute distress  Chest: Lungs clear to auscultation bilaterally  Neurological examination  Mentation: Alert oriented to time, place, history taking. Follows all commands speech and language fluent Cranial nerve II-XII: Extraocular movements were full, visual field were full on confrontational test Head turning and shoulder shrug  were normal and symmetric. Motor: The motor testing reveals 5 over 5 strength of all 4 extremities. Good symmetric motor tone is noted throughout.  Sensory: Sensory testing is intact to soft touch on all 4 extremities. No evidence of extinction is noted.  Gait and station: Patient is in a wheelchair.  Uses a cane to ambulate.   DIAGNOSTIC DATA (LABS, IMAGING, TESTING) - I reviewed patient records, labs, notes, testing and imaging myself where  available.  Lab Results  Component Value Date   WBC 9.8 03/20/2020   HGB 12.5 03/20/2020   HCT 39.9 03/20/2020   MCV 81 03/20/2020   PLT 277 03/20/2020      Component Value Date/Time   NA 138 03/20/2020 1923   K 4.2 03/20/2020 1923   CL 100 03/20/2020 1923   CO2 26 03/20/2020 1923   GLUCOSE 80 03/20/2020 1923   GLUCOSE 72 11/29/2019 1700   BUN 18 03/20/2020 1923   CREATININE 0.81 03/20/2020 1923   CALCIUM 9.9 03/20/2020 1923   PROT 7.1 03/20/2020 1923   ALBUMIN 4.1 03/20/2020 1923   AST 28 03/20/2020 1923   ALT 42 (H) 03/20/2020 1923   ALKPHOS 151 (H) 03/20/2020 1923   BILITOT 0.2 03/20/2020 1923   GFRNONAA 80 03/20/2020 1923   GFRAA 92 03/20/2020 1923   Lab Results  Component Value Date   CHOL  05/25/2009    169        ATP III CLASSIFICATION:  <200     mg/dL   Desirable  07/25/2009  mg/dL   Borderline High  892-119    mg/dL   High          HDL 44 05/25/2009   LDLCALC (H) 05/25/2009    105        Total Cholesterol/HDL:CHD Risk Coronary Heart Disease Risk Table                     Men   Women  1/2 Average Risk   3.4   3.3  Average Risk       5.0   4.4  2 X Average Risk   9.6   7.1  3 X Average Risk  23.4   11.0  Use the calculated Patient Ratio above and the CHD Risk Table to determine the patient's CHD Risk.        ATP III CLASSIFICATION (LDL):  <100     mg/dL   Optimal  998-338  mg/dL   Near or Above                    Optimal  130-159  mg/dL   Borderline  250-539  mg/dL   High  >767     mg/dL   Very High   TRIG 341 05/25/2009   CHOLHDL 3.8 05/25/2009   Lab Results  Component Value Date   HGBA1C (H) 05/24/2009    9.8 (NOTE) The ADA recommends the following therapeutic goal for glycemic control related to Hgb A1c measurement: Goal of therapy: <6.5 Hgb A1c  Reference: American Diabetes Association: Clinical Practice Recommendations 2010, Diabetes Care, 2010, 33: (Suppl  1).   No results found for: VITAMINB12 Lab Results  Component Value Date    TSH 3.354 11/29/2019      ASSESSMENT AND PLAN 60 y.o. year old female  has a past medical history of Allergy, Arthritis, Asthma, CHF (congestive heart failure) (HCC) (2008), Diabetes mellitus, Edema extremities, GERD (gastroesophageal reflux disease), Hyperlipidemia, Hypertension, Neuromuscular disorder (HCC), Obesity, OSA (obstructive sleep apnea) (10/13/2016), and Sleep apnea. here with:  OSA on CPAP  - CPAP compliance excellent -Residual AHI is elevated however this is most likely due to mask leaking. -Patient needs new straps.  We will reach out to her DME company - Encourage patient to use CPAP nightly and > 4 hours each night - F/U in 1 year or sooner if needed   Butch Penny, MSN, NP-C 07/24/2021, 11:03 AM [facility] [address] [place], [place] [address] [phone]

## 2021-07-24 ENCOUNTER — Telehealth: Payer: Self-pay | Admitting: *Deleted

## 2021-07-24 ENCOUNTER — Ambulatory Visit (INDEPENDENT_AMBULATORY_CARE_PROVIDER_SITE_OTHER): Payer: Medicare HMO | Admitting: Adult Health

## 2021-07-24 VITALS — BP 144/75 | HR 69 | Ht 63.0 in | Wt 341.4 lb

## 2021-07-24 DIAGNOSIS — G4733 Obstructive sleep apnea (adult) (pediatric): Secondary | ICD-10-CM

## 2021-07-24 DIAGNOSIS — Z9989 Dependence on other enabling machines and devices: Secondary | ICD-10-CM | POA: Diagnosis not present

## 2021-07-24 NOTE — Telephone Encounter (Signed)
Pt appointment was this am. Pt stated she needed a new head gear for her CPAP machine she was using a string. Pt stated she had spoke to [employer] a week ago and haven't heard any feedback. I called [employer] and they stated headgear was mailed on 07/18/2021 and should be getting her headgear any day now. Called patient made her aware and pt  thanked me for calling and making her aware.

## 2021-07-24 NOTE — Patient Instructions (Signed)
Continue using CPAP nightly and greater than 4 hours each night Call DME company if new supplies do not come by Friday If your symptoms worsen or you develop new symptoms please let us know.

## 2021-08-23 DIAGNOSIS — R0600 Dyspnea, unspecified: Secondary | ICD-10-CM | POA: Diagnosis not present

## 2021-08-23 DIAGNOSIS — E1169 Type 2 diabetes mellitus with other specified complication: Secondary | ICD-10-CM | POA: Diagnosis not present

## 2021-08-23 DIAGNOSIS — E785 Hyperlipidemia, unspecified: Secondary | ICD-10-CM | POA: Diagnosis not present

## 2021-08-23 DIAGNOSIS — I110 Hypertensive heart disease with heart failure: Secondary | ICD-10-CM | POA: Diagnosis not present

## 2021-08-23 DIAGNOSIS — D649 Anemia, unspecified: Secondary | ICD-10-CM | POA: Diagnosis not present

## 2021-08-23 DIAGNOSIS — I509 Heart failure, unspecified: Secondary | ICD-10-CM | POA: Diagnosis not present

## 2021-08-23 DIAGNOSIS — R0609 Other forms of dyspnea: Secondary | ICD-10-CM | POA: Diagnosis not present

## 2021-08-23 DIAGNOSIS — G4733 Obstructive sleep apnea (adult) (pediatric): Secondary | ICD-10-CM | POA: Diagnosis not present

## 2021-08-23 DIAGNOSIS — R609 Edema, unspecified: Secondary | ICD-10-CM | POA: Diagnosis not present

## 2021-08-30 ENCOUNTER — Ambulatory Visit (INDEPENDENT_AMBULATORY_CARE_PROVIDER_SITE_OTHER): Payer: Medicare HMO | Admitting: Cardiovascular Disease

## 2021-08-30 ENCOUNTER — Other Ambulatory Visit: Payer: Self-pay

## 2021-08-30 ENCOUNTER — Encounter (HOSPITAL_BASED_OUTPATIENT_CLINIC_OR_DEPARTMENT_OTHER): Payer: Self-pay | Admitting: Cardiovascular Disease

## 2021-08-30 VITALS — BP 151/79 | HR 68 | Ht 63.0 in | Wt 338.4 lb

## 2021-08-30 DIAGNOSIS — I10 Essential (primary) hypertension: Secondary | ICD-10-CM

## 2021-08-30 MED ORDER — FUROSEMIDE 40 MG PO TABS: 40.0000 mg | ORAL_TABLET | Freq: Two times a day (BID) | ORAL | 1 refills | Status: DC

## 2021-08-30 MED ORDER — VALSARTAN 320 MG PO TABS: 320.0000 mg | ORAL_TABLET | Freq: Every day | ORAL | 3 refills | Status: DC

## 2021-08-30 NOTE — Patient Instructions (Addendum)
Medication Instructions:  STOP LISINOPRIL HCT   START VALSARTAN 320 MG DAILY  INCREASE YOUR FUROSEMIDE TO 40 MG TWICE A DAY    Labwork: BMET/BNP 1 WEEK    Testing/Procedures: NONE   Follow-Up: 09/05/2021 10:00 AM WITH DR Laporte    You will receive a phone call from the PREP exercise and nutrition program to schedule an initial assessment.   DASH Eating Plan DASH stands for "Dietary Approaches to Stop Hypertension." The DASH eating plan is a healthy eating plan that has been shown to reduce high blood pressure (hypertension). It may also reduce your risk for type 2 diabetes, heart disease, and stroke. The DASH eating plan may also help with weight loss. What are tips for following this plan?  General guidelines Avoid eating more than 2,300 mg (milligrams) of salt (sodium) a day. If you have hypertension, you may need to reduce your sodium intake to 1,500 mg a day. Limit alcohol intake to no more than 1 drink a day for nonpregnant women and 2 drinks a day for men. One drink equals 12 oz of beer, 5 oz of wine, or 1 oz of hard liquor. Work with your health care provider to maintain a healthy body weight or to lose weight. Ask what an ideal weight is for you. Get at least 30 minutes of exercise that causes your heart to beat faster (aerobic exercise) most days of the week. Activities may include walking, swimming, or biking. Work with your health care provider or diet and nutrition specialist (dietitian) to adjust your eating plan to your individual calorie needs. Reading food labels  Check food labels for the amount of sodium per serving. Choose foods with less than 5 percent of the Daily Value of sodium. Generally, foods with less than 300 mg of sodium per serving fit into this eating plan. To find whole grains, look for the word "whole" as the first word in the ingredient list. Shopping Buy products labeled as "low-sodium" or "no salt added." Buy fresh foods. Avoid canned foods and  premade or frozen meals. Cooking Avoid adding salt when cooking. Use salt-free seasonings or herbs instead of table salt or sea salt. Check with your health care provider or pharmacist before using salt substitutes. Do not fry foods. Cook foods using healthy methods such as baking, boiling, grilling, and broiling instead. Cook with heart-healthy oils, such as olive, canola, soybean, or sunflower oil. Meal planning Eat a balanced diet that includes: 5 or more servings of fruits and vegetables each day. At each meal, try to fill half of your plate with fruits and vegetables. Up to 6-8 servings of whole grains each day. Less than 6 oz of lean meat, poultry, or fish each day. A 3-oz serving of meat is about the same size as a deck of cards. One egg equals 1 oz. 2 servings of low-fat dairy each day. A serving of nuts, seeds, or beans 5 times each week. Heart-healthy fats. Healthy fats called Omega-3 fatty acids are found in foods such as flaxseeds and coldwater fish, like sardines, salmon, and mackerel. Limit how much you eat of the following: Canned or prepackaged foods. Food that is high in trans fat, such as fried foods. Food that is high in saturated fat, such as fatty meat. Sweets, desserts, sugary drinks, and other foods with added sugar. Full-fat dairy products. Do not salt foods before eating. Try to eat at least 2 vegetarian meals each week. Eat more home-cooked food and less restaurant, buffet, and fast  food. When eating at a restaurant, ask that your food be prepared with less salt or no salt, if possible. What foods are recommended? The items listed may not be a complete list. Talk with your dietitian about what dietary choices are best for you. Grains Whole-grain or whole-wheat bread. Whole-grain or whole-wheat pasta. Brown rice. Orpah Cobb. Bulgur. Whole-grain and low-sodium cereals. Pita bread. Low-fat, low-sodium crackers. Whole-wheat flour tortillas. Vegetables Fresh or  frozen vegetables (raw, steamed, roasted, or grilled). Low-sodium or reduced-sodium tomato and vegetable juice. Low-sodium or reduced-sodium tomato sauce and tomato paste. Low-sodium or reduced-sodium canned vegetables. Fruits All fresh, dried, or frozen fruit. Canned fruit in natural juice (without added sugar). Meat and other protein foods Skinless chicken or [place]. Ground chicken or [place]. Pork with fat trimmed off. Fish and seafood. Egg whites. Dried beans, peas, or lentils. Unsalted nuts, nut butters, and seeds. Unsalted canned beans. Lean cuts of beef with fat trimmed off. Low-sodium, lean deli meat. Dairy Low-fat (1%) or fat-free (skim) milk. Fat-free, low-fat, or reduced-fat cheeses. Nonfat, low-sodium ricotta or cottage cheese. Low-fat or nonfat yogurt. Low-fat, low-sodium cheese. Fats and oils Soft margarine without trans fats. Vegetable oil. Low-fat, reduced-fat, or light mayonnaise and salad dressings (reduced-sodium). Canola, safflower, olive, soybean, and sunflower oils. Avocado. Seasoning and other foods Herbs. Spices. Seasoning mixes without salt. Unsalted popcorn and pretzels. Fat-free sweets. What foods are not recommended? The items listed may not be a complete list. Talk with your dietitian about what dietary choices are best for you. Grains Baked goods made with fat, such as croissants, muffins, or some breads. Dry pasta or rice meal packs. Vegetables Creamed or fried vegetables. Vegetables in a cheese sauce. Regular canned vegetables (not low-sodium or reduced-sodium). Regular canned tomato sauce and paste (not low-sodium or reduced-sodium). Regular tomato and vegetable juice (not low-sodium or reduced-sodium). Rosita Fire. Olives. Fruits Canned fruit in a light or heavy syrup. Fried fruit. Fruit in cream or butter sauce. Meat and other protein foods Fatty cuts of meat. Ribs. Fried meat. Tomasa Blase. Sausage. Bologna and other processed lunch meats. Salami. Fatback. Hotdogs.  Bratwurst. Salted nuts and seeds. Canned beans with added salt. Canned or smoked fish. Whole eggs or egg yolks. Chicken or [place] with skin. Dairy Whole or 2% milk, cream, and half-and-half. Whole or full-fat cream cheese. Whole-fat or sweetened yogurt. Full-fat cheese. Nondairy creamers. Whipped toppings. Processed cheese and cheese spreads. Fats and oils Butter. Stick margarine. Lard. Shortening. Ghee. Bacon fat. Tropical oils, such as coconut, palm kernel, or palm oil. Seasoning and other foods Salted popcorn and pretzels. Onion salt, garlic salt, seasoned salt, table salt, and sea salt. Worcestershire sauce. Tartar sauce. Barbecue sauce. Teriyaki sauce. Soy sauce, including reduced-sodium. Steak sauce. Canned and packaged gravies. Fish sauce. Oyster sauce. Cocktail sauce. Horseradish that you find on the shelf. Ketchup. Mustard. Meat flavorings and tenderizers. Bouillon cubes. Hot sauce and Tabasco sauce. Premade or packaged marinades. Premade or packaged taco seasonings. Relishes. Regular salad dressings. Where to find more information: National Heart, Lung, and Blood Institute: PopSteam.is American Heart Association: www.heart.org Summary The DASH eating plan is a healthy eating plan that has been shown to reduce high blood pressure (hypertension). It may also reduce your risk for type 2 diabetes, heart disease, and stroke. With the DASH eating plan, you should limit salt (sodium) intake to 2,300 mg a day. If you have hypertension, you may need to reduce your sodium intake to 1,500 mg a day. When on the DASH eating plan, aim to eat more  fresh fruits and vegetables, whole grains, lean proteins, low-fat dairy, and heart-healthy fats. Work with your health care provider or diet and nutrition specialist (dietitian) to adjust your eating plan to your individual calorie needs. This information is not intended to replace advice given to you by your health care provider. Make sure you discuss any  questions you have with your health care provider. Document Released: 07/24/2011 Document Revised: 07/17/2017 Document Reviewed: 07/28/2016 Elsevier Patient Education  2020 [employer].

## 2021-08-30 NOTE — Progress Notes (Signed)
Cardiology Office Note   Date:  09/04/2021   ID:  Katelyn Howard, DOB 06/08/1961, MRN [id number]  PCP:  Crist Infante, MD  Cardiologist:   Skeet Latch, MD   No chief complaint on file.    History of Present Illness: Katelyn Howard is a 61 y.o. female with OSA on CPAP, asthma, morbid obesity and diabetes who is being seen today for the evaluation of shortness of breath at the request of Crist Infante, MD.  Last week she started feeling more short of breath. She tried using her inhaler without improvement.  She was short of breath laying down even on her CPAP.  She saw her PCP and increased lasix from 40 mg daily to 40 mg twice daily with some improvement.  She did note LE edema, orthopnea and PND.  Since starting lasix her breathing improved but is not back at baseline.  She doesn't have a scale but notes that her clothes are tighter.  She denies chest pain, though she notes that she can't exercise much due to back pain.  She uses a cane for walking.  She doesn't limit sodium intake or follow a particular diet.    Past Medical History:  Diagnosis Date   Allergy    Arthritis    Asthma    CHF (congestive heart failure) ([place]) 2008   Diabetes mellitus    Edema extremities    GERD (gastroesophageal reflux disease)    Hyperlipidemia    Hypertension    Neuromuscular disorder (HCC)    neuropathy   Obesity    OSA (obstructive sleep apnea) 10/13/2016   Severe with AHI 44.9/hr now on CPAP at 11cm H2O   Sleep apnea    wears cpap     Past Surgical History:  Procedure Laterality Date   ABDOMINAL HYSTERECTOMY     EYE SURGERY       Current Outpatient Medications  Medication Sig Dispense Refill   acetaminophen (TYLENOL) 500 MG tablet Take 1 tablet (500 mg total) by mouth every 6 (six) hours as needed. 30 tablet 0   albuterol (PROVENTIL HFA;VENTOLIN HFA) 108 (90 BASE) MCG/ACT inhaler Inhale 2 puffs into the lungs every 6 (six) hours as needed. For wheezing     amLODipine  (NORVASC) 5 MG tablet Take 5 mg by mouth daily.     aspirin EC 81 MG tablet Take 81 mg by mouth daily.     atorvastatin (LIPITOR) 80 MG tablet Take 80 mg by mouth every morning.      Cholecalciferol 50000 units capsule Take 50,000 Units by mouth every Thursday.      cloNIDine (CATAPRES) 0.1 MG tablet Take 1 tablet (0.1 mg total) by mouth 2 (two) times daily. 60 tablet 11   clotrimazole (LOTRIMIN) 1 % cream Apply 1 application topically daily as needed (for rash).     clotrimazole-betamethasone (LOTRISONE) cream Apply 1 application topically 2 (two) times daily as needed (for rash).     esomeprazole (NEXIUM) 40 MG capsule Take 40 mg by mouth 2 (two) times daily before a meal.      fluticasone (CUTIVATE) 0.05 % cream Apply topically 2 (two) times daily. 30 g 1   fluticasone (FLONASE) 50 MCG/ACT nasal spray Place 1 spray into both nostrils 2 (two) times daily.     folic acid (FOLVITE) A999333 MCG tablet Take 400 mcg by mouth daily.     glimepiride (AMARYL) 4 MG tablet Take 4 mg by mouth daily with breakfast.  HYDROcodone-acetaminophen (NORCO/VICODIN) 5-325 MG tablet Take 1 tablet by mouth as directed.     hydrOXYzine (ATARAX/VISTARIL) 25 MG tablet Take 1 tablet (25 mg total) by mouth every 6 (six) hours. Prn itching 20 tablet 1   insulin degludec (TRESIBA FLEXTOUCH) 200 UNIT/ML FlexTouch Pen INJECT 80 UNITS SUBCUTANEOUSLY IN THE MORNING AND 80 IN THE EVENING INCREASE AS DIRECTED PER CLINIC     methocarbamol (ROBAXIN) 500 MG tablet Take 1 tablet by mouth daily.     nebivolol (BYSTOLIC) 10 MG tablet Take 10 mg by mouth daily.     potassium chloride (K-DUR) 10 MEQ tablet Take 10 mEq by mouth daily.      terbinafine (LAMISIL) 250 MG tablet Take 1 tablet (250 mg total) by mouth daily. 30 tablet 0   TRULICITY 1.5 0000000 SOPN Inject 1.5 mg as directed every Wednesday.      valsartan (DIOVAN) 320 MG tablet Take 1 tablet (320 mg total) by mouth daily. 90 tablet 3   furosemide (LASIX) 40 MG tablet Take 1  tablet (40 mg total) by mouth 2 (two) times daily. 180 tablet 1   No current facility-administered medications for this visit.    Allergies:   Shrimp [shellfish allergy], Tetracycline, and Penicillins    Social History:  The patient  reports that she has never smoked. She has never used smokeless tobacco. She reports that she does not drink alcohol and does not use drugs.   Family History:  The patient's family history includes Arthritis in her father; Diabetes in her brother and sister; Gout in her father; Heart attack in her sister; Heart disease in her father; Heart failure in her father; Hypertension in her brother, father, and sister; Stroke in her brother and mother.    ROS:  Please see the history of present illness.   Otherwise, review of systems are positive for none.   All other systems are reviewed and negative.    PHYSICAL EXAM: VS:  BP (!) 151/79 (BP Location: Right Arm, Patient Position: Sitting, Cuff Size: Large)    Pulse 68    Ht 5\' 3"  (1.6 m)    Wt (!) 338 lb 6.4 oz (153.5 kg)    BMI 59.94 kg/m  , BMI Body mass index is 59.94 kg/m. GENERAL:  Well appearing HEENT:  Pupils equal round and reactive, fundi not visualized, oral mucosa unremarkable NECK:  No jugular venous distention, waveform within normal limits, carotid upstroke brisk and symmetric, no bruits, no thyromegaly LUNGS:  Clear to auscultation bilaterally HEART:  RRR.  PMI not displaced or sustained,S1 and S2 within normal limits, no S3, no S4, no clicks, no rubs, no murmurs ABD:  Flat, positive bowel sounds normal in frequency in pitch, no bruits, no rebound, no guarding, no midline pulsatile mass, no hepatomegaly, no splenomegaly EXT:  2 plus pulses throughout, no edema, no cyanosis no clubbing SKIN:  No rashes no nodules NEURO:  Cranial nerves II through XII grossly intact, motor grossly intact throughout PSYCH:  Cognitively intact, oriented to person place and time  EKG:  EKG is ordered today. The ekg  ordered today demonstrates sinus rhythm.  Rate 68 bpm  Recent Labs: No results found for requested labs within last 8760 hours.   08/23/2021: Sodium 143, potassium 3.7, BUN 11, creatinine 0.7 WBC 9, hemoglobin 11.8, hematocrit 37.4, platelets 256  Lipid Panel    Component Value Date/Time   CHOL  05/25/2009 0605    169        ATP III CLASSIFICATION:  <  200     mg/dL   Desirable  200-239  mg/dL   Borderline High  >=240    mg/dL   High          TRIG 101 05/25/2009 0605   HDL 44 05/25/2009 0605   CHOLHDL 3.8 05/25/2009 0605   VLDL 20 05/25/2009 0605   LDLCALC (H) 05/25/2009 0605    105        Total Cholesterol/HDL:CHD Risk Coronary Heart Disease Risk Table                     Men   Women  1/2 Average Risk   3.4   3.3  Average Risk       5.0   4.4  2 X Average Risk   9.6   7.1  3 X Average Risk  23.4   11.0        Use the calculated Patient Ratio above and the CHD Risk Table to determine the patient's CHD Risk.        ATP III CLASSIFICATION (LDL):  <100     mg/dL   Optimal  100-129  mg/dL   Near or Above                    Optimal  130-159  mg/dL   Borderline  160-189  mg/dL   High  >190     mg/dL   Very High      Wt Readings from Last 3 Encounters:  08/30/21 (!) 338 lb 6.4 oz (153.5 kg)  07/24/21 (!) 341 lb 6.4 oz (154.9 kg)  07/18/20 (!) 329 lb (149.2 kg)      ASSESSMENT AND PLAN:  No problem-specific Assessment & Plan notes found for this encounter.  # Heart failure, type unknown:  Symptoms improved with starting lasix.  We will get an echo.   # Essential hypertension:  BP is poorly controlled.  She had better volume control with lasix, but ideally she wouldn't be on both lasix and HCTZ.  Recommend stopping HCTZ and increasing lasix to 40mg  bid.  We will stop lisinopril/HCTZ and start valsartan 320mg  daily.  Check a BMP and BNP in a week. Continue amlodipine, clonidine, and nebivolol.  Discussed DASH diet.  Will refer to [facility].  # Pure hypercholesterolemia:   Continue atorvastatin.   Current medicines are reviewed at length with the patient today.  The patient has concerns regarding medicines.  The following changes have been made:  Stop HCTZ and lisinopril.  Add valsartan.   Labs/ tests ordered today include:   Orders Placed This Encounter  Procedures   Amb Referral To Provider Referral Exercise Program (P.R.E.P)   EKG 12-Lead     Disposition:   FU with Azaliah Carrero C. Oval Linsey, MD, [facility] in 1 week in ADV HTN clinic.    Signed, Champagne Paletta C. Oval Linsey, MD, [facility]  09/04/2021 8:36 AM    [place]

## 2021-09-02 DIAGNOSIS — R609 Edema, unspecified: Secondary | ICD-10-CM | POA: Diagnosis not present

## 2021-09-02 DIAGNOSIS — L03115 Cellulitis of right lower limb: Secondary | ICD-10-CM | POA: Diagnosis not present

## 2021-09-02 DIAGNOSIS — I110 Hypertensive heart disease with heart failure: Secondary | ICD-10-CM | POA: Diagnosis not present

## 2021-09-02 DIAGNOSIS — I509 Heart failure, unspecified: Secondary | ICD-10-CM | POA: Diagnosis not present

## 2021-09-02 DIAGNOSIS — R0609 Other forms of dyspnea: Secondary | ICD-10-CM | POA: Diagnosis not present

## 2021-09-03 DIAGNOSIS — J449 Chronic obstructive pulmonary disease, unspecified: Secondary | ICD-10-CM | POA: Diagnosis not present

## 2021-09-04 ENCOUNTER — Encounter (HOSPITAL_BASED_OUTPATIENT_CLINIC_OR_DEPARTMENT_OTHER): Payer: Self-pay | Admitting: Cardiovascular Disease

## 2021-09-05 ENCOUNTER — Encounter (HOSPITAL_BASED_OUTPATIENT_CLINIC_OR_DEPARTMENT_OTHER): Payer: Self-pay | Admitting: Cardiovascular Disease

## 2021-09-05 ENCOUNTER — Other Ambulatory Visit: Payer: Self-pay

## 2021-09-05 ENCOUNTER — Encounter (HOSPITAL_BASED_OUTPATIENT_CLINIC_OR_DEPARTMENT_OTHER): Payer: Self-pay

## 2021-09-05 ENCOUNTER — Ambulatory Visit (INDEPENDENT_AMBULATORY_CARE_PROVIDER_SITE_OTHER): Payer: Medicare HMO | Admitting: Cardiovascular Disease

## 2021-09-05 VITALS — BP 146/80 | HR 71 | Ht 63.0 in | Wt 333.7 lb

## 2021-09-05 DIAGNOSIS — R0602 Shortness of breath: Secondary | ICD-10-CM

## 2021-09-05 DIAGNOSIS — Z006 Encounter for examination for normal comparison and control in clinical research program: Secondary | ICD-10-CM

## 2021-09-05 DIAGNOSIS — E6609 Other obesity due to excess calories: Secondary | ICD-10-CM | POA: Diagnosis not present

## 2021-09-05 DIAGNOSIS — G4733 Obstructive sleep apnea (adult) (pediatric): Secondary | ICD-10-CM

## 2021-09-05 DIAGNOSIS — I509 Heart failure, unspecified: Secondary | ICD-10-CM

## 2021-09-05 DIAGNOSIS — E7800 Pure hypercholesterolemia, unspecified: Secondary | ICD-10-CM | POA: Diagnosis not present

## 2021-09-05 DIAGNOSIS — I10 Essential (primary) hypertension: Secondary | ICD-10-CM

## 2021-09-05 DIAGNOSIS — I5030 Unspecified diastolic (congestive) heart failure: Secondary | ICD-10-CM

## 2021-09-05 HISTORY — DX: Unspecified diastolic (congestive) heart failure: I50.30

## 2021-09-05 HISTORY — DX: Heart failure, unspecified: I50.9

## 2021-09-05 NOTE — Assessment & Plan Note (Signed)
She has shortness of breath and has had lower extremity edema that has been responsive to diuretics.  We will get an echocardiogram.  Increasing furosemide as above.  Blood pressure control as above.

## 2021-09-05 NOTE — Research (Signed)
Subject Name: Katelyn Howard met inclusion and exclusion criteria for the Virtual Care and Social Determinant Interventions for the management of hypertension trial.  The informed consent form, study requirements and expectations were reviewed with the subject by Dr. Oval Linsey and myself. The subject was given the opportunity to read the consent and ask questions. The subject verbalized understanding of the trial requirements.  All questions were addressed prior to the signing of the consent form. The subject agreed to participate in the trial and signed the informed consent. The informed consent was obtained prior to performance of any protocol-specific procedures for the subject.  A copy of the signed informed consent was given to the subject and a copy was placed in the subject's medical record.  Katelyn Howard was randomized to Group 1.

## 2021-09-05 NOTE — Assessment & Plan Note (Signed)
Blood pressure remains poorly controlled.  She did not yet make the changes that reck recommended at her last visit.  She is going to start valsartan 320 mg daily.  Stop lisinopril/HCTZ.  She will increase her furosemide to 40 mg twice a day.  Continue amlodipine, clonidine, and nebivolol.  Check a BMP and a BNP in a week.  She is interested in our remote patient monitoring study and consents to monitoring in the [employer] remote patient monitoring system.  We [facility].  Continue treatment of sleep apnea with CPAP.  Check cortisol level.  TSH is within normal limits.

## 2021-09-05 NOTE — Patient Instructions (Signed)
Medication Instructions:  TAKE YOUR MEDICATIONS AS LISTED ON YOUR MEDICATION LIST PROVIDED   Labwork: BMET/BNP/CORTISOL IN 1 WEEK   Testing/Procedures: Your physician has requested that you have a renal artery duplex. During this test, an ultrasound is used to evaluate blood flow to the kidneys. Allow one hour for this exam. Do not eat after midnight the day before and avoid carbonated beverages. Take your medications as you usually do.   Your physician has requested that you have an echocardiogram. Echocardiography is a painless test that uses sound waves to create images of your heart. It provides your doctor with information about the size and shape of your heart and how well your hearts chambers and valves are working. This procedure takes approximately one hour. There are no restrictions for this procedure.   Follow-Up: 10/08/2021 2:30 PM WITH PHARM D AT [facility] OFFICE   Any Other Special Instructions Will Be Listed Below (If Applicable).  MONITOR BLOOD PRESSURE TWICE A DAY WITH MACHINE PROVIDED

## 2021-09-05 NOTE — Progress Notes (Signed)
[facility]   Date:  09/05/2021   ID:  Katelyn Howard, DOB [birth year], MRN [id number]  PCP:  Rodrigo Ran, MD  Cardiologist:   Chilton Si, MD   No chief complaint on file.    History of Present Illness: Katelyn Howard is a 61 y.o. female with OSA on CPAP, asthma, morbid obesity and diabetes who is being seen today for follow-up in the [facility].  2 weeks ago, she started feeling more short of breath. She tried using her inhaler without improvement.  She was short of breath laying down even on her CPAP.  She saw her PCP and increased lasix from 40 mg daily to 40 mg twice daily with some improvement.  She did note LE edema, orthopnea and PND.  Since starting lasix her breathing improved but is not back at baseline.  She doesn't have a scale but notes that her clothes are tighter.  She denies chest pain, though she notes that she can't exercise much due to back pain.  She uses a cane for walking.  She doesn't limit sodium intake or follow a particular diet.    At her visit last week, her blood pressure was 151/79. HCTZ was stopped and lasix was increased to 40 mg twice daily. She was started on valsartan and referred to the PREP program.  Today, she is dong well. Last night, she had a palpitation episode she describes as a flutter that lasted for 30 minutes. She was sitting and watching television when the episode happened. She became short of breath and continues to feel mildly short of breath. She denies previous palpitation episodes.  She has not yet started the valsartan but intends to start today. Her last blood pressure measurement in clinic was in the 130s systolic. Her bilateral LE swell often. She is compliant with taking her fluid pills and notices her weight fluctuates. Recently, she found a blister on her posterior R leg. She is currently taking antibiotics and has her R leg wrapped. Nightly, she uses her CPAP machine. She watches the  salt in her food and does not drink caffeine or alcohol. For pain, she typically opts for Tylenol or oxycodone as needed. Prior to the pandemic, she was attending the [employer] program at the [facility]. She denies any chest pain, lightheadedness, headaches, syncope, orthopnea, PND, or exertional symptoms.  Past Medical History:  Diagnosis Date   Allergy    Arthritis    Asthma    CHF (congestive heart failure) (HCC) 2008   Diabetes mellitus    Edema extremities    GERD (gastroesophageal reflux disease)    Heart failure, type unknown (HCC) 09/05/2021   Hyperlipidemia    Hypertension    Neuromuscular disorder (HCC)    neuropathy   Obesity    OSA (obstructive sleep apnea) 10/13/2016   Severe with AHI 44.9/hr now on CPAP at 11cm H2O   Sleep apnea    wears cpap     Past Surgical History:  Procedure Laterality Date   ABDOMINAL HYSTERECTOMY     EYE SURGERY       Current Outpatient Medications  Medication Sig Dispense Refill   acetaminophen (TYLENOL) 500 MG tablet Take 1 tablet (500 mg total) by mouth every 6 (six) hours as needed. 30 tablet 0   albuterol (PROVENTIL HFA;VENTOLIN HFA) 108 (90 BASE) MCG/ACT inhaler Inhale 2 puffs into the lungs every 6 (six) hours as needed. For wheezing     amLODipine (NORVASC) 5 MG  tablet Take 5 mg by mouth daily.     aspirin EC 81 MG tablet Take 81 mg by mouth daily.     atorvastatin (LIPITOR) 80 MG tablet Take 80 mg by mouth every morning.      Cholecalciferol 50000 units capsule Take 50,000 Units by mouth every Thursday.      cloNIDine (CATAPRES) 0.1 MG tablet Take 1 tablet (0.1 mg total) by mouth 2 (two) times daily. 60 tablet 11   clotrimazole (LOTRIMIN) 1 % cream Apply 1 application topically daily as needed (for rash).     clotrimazole-betamethasone (LOTRISONE) cream Apply 1 application topically 2 (two) times daily as needed (for rash).     esomeprazole (NEXIUM) 40 MG capsule Take 40 mg by mouth 2 (two) times daily before a meal.       fluticasone (CUTIVATE) 0.05 % cream Apply topically 2 (two) times daily. 30 g 1   fluticasone (FLONASE) 50 MCG/ACT nasal spray Place 1 spray into both nostrils 2 (two) times daily.     folic acid (FOLVITE) A999333 MCG tablet Take 400 mcg by mouth daily.     furosemide (LASIX) 40 MG tablet Take 1 tablet (40 mg total) by mouth 2 (two) times daily. 180 tablet 1   glimepiride (AMARYL) 4 MG tablet Take 4 mg by mouth daily with breakfast.      HYDROcodone-acetaminophen (NORCO/VICODIN) 5-325 MG tablet Take 1 tablet by mouth as directed.     hydrOXYzine (ATARAX/VISTARIL) 25 MG tablet Take 1 tablet (25 mg total) by mouth every 6 (six) hours. Prn itching 20 tablet 1   insulin degludec (TRESIBA FLEXTOUCH) 200 UNIT/ML FlexTouch Pen INJECT 80 UNITS SUBCUTANEOUSLY IN THE MORNING AND 80 IN THE EVENING INCREASE AS DIRECTED PER CLINIC     methocarbamol (ROBAXIN) 500 MG tablet Take 1 tablet by mouth daily.     nebivolol (BYSTOLIC) 10 MG tablet Take 10 mg by mouth daily.     potassium chloride (K-DUR) 10 MEQ tablet Take 10 mEq by mouth daily.      terbinafine (LAMISIL) 250 MG tablet Take 1 tablet (250 mg total) by mouth daily. 30 tablet 0   TRULICITY 1.5 0000000 SOPN Inject 1.5 mg as directed every Wednesday.      valsartan (DIOVAN) 320 MG tablet Take 1 tablet (320 mg total) by mouth daily. 90 tablet 3   No current facility-administered medications for this visit.    Allergies:   Shrimp [shellfish allergy], Tetracycline, and Penicillins    Social History:  The patient  reports that she has never smoked. She has never used smokeless tobacco. She reports that she does not drink alcohol and does not use drugs.   Family History:  The patient's family history includes Arthritis in her father; Diabetes in her brother and sister; Gout in her father; Heart attack in her sister; Heart disease in her father; Heart failure in her father; Hypertension in her brother, father, and sister; Stroke in her brother and mother.     ROS:  Please see the history of present illness.  (+) Palpitation (+) Shortness of breath (+) Bilateral LE edema (+) RLE blister All other systems are reviewed and negative.   PHYSICAL EXAM: VS:  BP (!) 146/80 (BP Location: Left Arm, Patient Position: Sitting, Cuff Size: Large)    Pulse 71    Ht 5\' 3"  (1.6 m)    Wt (!) 333 lb 11.2 oz (151.4 kg)    SpO2 100%    BMI 59.11 kg/m  , BMI Body  mass index is 59.11 kg/m. GENERAL:  Well appearing HEENT:  Pupils equal round and reactive, fundi not visualized, oral mucosa unremarkable NECK:  No jugular venous distention, waveform within normal limits, carotid upstroke brisk and symmetric, no bruits, no thyromegaly LUNGS:  Clear to auscultation bilaterally HEART:  RRR.  PMI not displaced or sustained,S1 and S2 within normal limits, no S3, no S4, no clicks, no rubs, no murmurs ABD:  Flat, positive bowel sounds normal in frequency in pitch, no bruits, no rebound, no guarding, no midline pulsatile mass, no hepatomegaly, no splenomegaly EXT:  2 plus pulses throughout, trace edema, no cyanosis no clubbing SKIN:  No rashes no nodules NEURO:  Cranial nerves II through XII grossly intact, motor grossly intact throughout PSYCH:  Cognitively intact, oriented to person place and time  Echo 06/23/2016 - Left ventricle: The cavity size was normal. Systolic function was    normal. The estimated ejection fraction was in the range of 55%    to 60%. Wall motion was normal; there were no regional wall    motion abnormalities. The transmitral flow pattern was normal.    The pulmonary vein flow pattern was normal. Left ventricular    diastolic function parameters were mildly abnormal for age.    Indeterminate filling pressure by Doppler parameters.  - Atrial septum: No defect or patent foramen ovale was identified.   EKG:  EKG was not ordered today 08/30/21: sinus rhythm.  Rate 68 bpm  Recent Labs: No results found for requested labs within last 8760 hours.    08/23/2021: Sodium 143, potassium 3.7, BUN 11, creatinine 0.7 WBC 9, hemoglobin 11.8, hematocrit 37.4, platelets 256  Lipid Panel    Component Value Date/Time   CHOL  05/25/2009 0605    169        ATP III CLASSIFICATION:  <200     mg/dL   Desirable  200-239  mg/dL   Borderline High  >=240    mg/dL   High          TRIG 101 05/25/2009 0605   HDL 44 05/25/2009 0605   CHOLHDL 3.8 05/25/2009 0605   VLDL 20 05/25/2009 0605   LDLCALC (H) 05/25/2009 0605    105        Total Cholesterol/HDL:CHD Risk Coronary Heart Disease Risk Table                     Men   Women  1/2 Average Risk   3.4   3.3  Average Risk       5.0   4.4  2 X Average Risk   9.6   7.1  3 X Average Risk  23.4   11.0        Use the calculated Patient Ratio above and the CHD Risk Table to determine the patient's CHD Risk.        ATP III CLASSIFICATION (LDL):  <100     mg/dL   Optimal  100-129  mg/dL   Near or Above                    Optimal  130-159  mg/dL   Borderline  160-189  mg/dL   High  >190     mg/dL   Very High      Wt Readings from Last 3 Encounters:  09/05/21 (!) 333 lb 11.2 oz (151.4 kg)  08/30/21 (!) 338 lb 6.4 oz (153.5 kg)  07/24/21 (!) 341 lb 6.4 oz (154.9 kg)  ASSESSMENT AND PLAN:  Resistant hypertension Blood pressure remains poorly controlled.  She did not yet make the changes that reck recommended at her last visit.  She is going to start valsartan 320 mg daily.  Stop lisinopril/HCTZ.  She will increase her furosemide to 40 mg twice a day.  Continue amlodipine, clonidine, and nebivolol.  Check a BMP and a BNP in a week.  She is interested in our remote patient monitoring study and consents to monitoring in the [facility] remote patient monitoring system.  We will also refer her to the PR EP program to the [facility].  Continue treatment of sleep apnea with CPAP.  Check cortisol level.  TSH is within normal limits.  OSA (obstructive sleep apnea) Continue CPAP.  HYPERCHOLESTEROLEMIA,  PURE Continue atorvastatin.  Heart failure, type unknown ([place]) She has shortness of breath and has had lower extremity edema that has been responsive to diuretics.  We will get an echocardiogram.  Increasing furosemide as above.  Blood pressure control as above.  Obesity Referral to the [facility] program.    Current medicines are reviewed at length with the patient today.  The patient has concerns regarding medicines.  The following changes have been made:  Stop HCTZ and lisinopril.  Add valsartan.   Labs/ tests ordered today include:   Orders Placed This Encounter  Procedures   Cortisol   Basic metabolic panel   B Nat Peptide   Amb Referral To Provider Referral Exercise Program (P.R.E.P)   ECHOCARDIOGRAM COMPLETE   VAS US RENAL ARTERY DUPLEX    She consents to be monitored in our remote patient monitoring program through [place].  she will track his blood pressure twice daily and understands that these trends will help [place] to adjust her medications as needed prior to his next appointment.  She is interested in enrolling in the PREP exercise and nutrition program through the [facility].    Disposition:    FU with APP in 1-2 months FU with Marialy Urbanczyk C. Oval Linsey, MD, [facility] in 4 months  I,Mykaella Javier,acting as a scribe for Skeet Latch, MD.,have documented all relevant documentation on the behalf of Skeet Latch, MD,as directed by  Skeet Latch, MD while in the presence of Skeet Latch, MD.  I, [place] Oval Linsey, MD have reviewed all documentation for this visit.  The documentation of the exam, diagnosis, procedures, and orders on 09/05/2021 are all accurate and complete.   Signed, Elin Seats C. Oval Linsey, MD, [facility]  09/05/2021 12:04 PM    [place]

## 2021-09-05 NOTE — Assessment & Plan Note (Signed)
Continue CPAP.  

## 2021-09-05 NOTE — Assessment & Plan Note (Signed)
Referral to the [facility] program.

## 2021-09-05 NOTE — Assessment & Plan Note (Signed)
Continue atorvastatin

## 2021-09-06 ENCOUNTER — Telehealth: Payer: Self-pay

## 2021-09-06 NOTE — Telephone Encounter (Signed)
Call placed to patient reference PREP referral Pt at an appt and will call back

## 2021-09-06 NOTE — Telephone Encounter (Signed)
Returned Biomedical engineer to [employer] Will call her with the next class at [facility]

## 2021-09-09 DIAGNOSIS — Z23 Encounter for immunization: Secondary | ICD-10-CM | POA: Diagnosis not present

## 2021-09-09 DIAGNOSIS — L03115 Cellulitis of right lower limb: Secondary | ICD-10-CM | POA: Diagnosis not present

## 2021-09-09 DIAGNOSIS — I509 Heart failure, unspecified: Secondary | ICD-10-CM | POA: Diagnosis not present

## 2021-09-09 DIAGNOSIS — R609 Edema, unspecified: Secondary | ICD-10-CM | POA: Diagnosis not present

## 2021-09-12 ENCOUNTER — Ambulatory Visit (INDEPENDENT_AMBULATORY_CARE_PROVIDER_SITE_OTHER): Payer: Medicare HMO

## 2021-09-12 ENCOUNTER — Other Ambulatory Visit: Payer: Self-pay

## 2021-09-12 DIAGNOSIS — R0602 Shortness of breath: Secondary | ICD-10-CM

## 2021-09-12 DIAGNOSIS — I10 Essential (primary) hypertension: Secondary | ICD-10-CM | POA: Diagnosis not present

## 2021-09-12 LAB — ECHOCARDIOGRAM COMPLETE
AR max vel: 1.78 cm2
AV Area VTI: 1.81 cm2
AV Area mean vel: 1.65 cm2
AV Mean grad: 5.5 mmHg
AV Peak grad: 11.1 mmHg
Ao pk vel: 1.67 m/s
Area-P 1/2: 3.51 cm2
Calc EF: 69.3 %
S' Lateral: 3.33 cm
Single Plane A2C EF: 66.6 %
Single Plane A4C EF: 69.9 %

## 2021-09-25 DIAGNOSIS — J449 Chronic obstructive pulmonary disease, unspecified: Secondary | ICD-10-CM | POA: Diagnosis not present

## 2021-09-27 ENCOUNTER — Telehealth: Payer: Self-pay

## 2021-09-27 NOTE — Telephone Encounter (Signed)
Call to pt to confirm class for PREP on 10/29/21 615p-730p T/TH x 12 wks Will call pt closer to start of class to do initial intake  Information texted to pt as requested to her cell

## 2021-10-08 ENCOUNTER — Ambulatory Visit: Payer: Medicare HMO

## 2021-10-17 DIAGNOSIS — J449 Chronic obstructive pulmonary disease, unspecified: Secondary | ICD-10-CM | POA: Diagnosis not present

## 2021-10-21 ENCOUNTER — Ambulatory Visit: Payer: Medicare HMO

## 2021-10-21 ENCOUNTER — Telehealth: Payer: Self-pay

## 2021-10-21 NOTE — Telephone Encounter (Signed)
Call to pt reference next PREP evening class at [employer] 11/05/21 ?Can do class,  ?Intake scheduled for 10/31/21 at 530pm ?

## 2021-10-28 DIAGNOSIS — R0609 Other forms of dyspnea: Secondary | ICD-10-CM | POA: Diagnosis not present

## 2021-10-28 DIAGNOSIS — B372 Candidiasis of skin and nail: Secondary | ICD-10-CM | POA: Diagnosis not present

## 2021-10-28 DIAGNOSIS — I872 Venous insufficiency (chronic) (peripheral): Secondary | ICD-10-CM | POA: Diagnosis not present

## 2021-10-28 DIAGNOSIS — I110 Hypertensive heart disease with heart failure: Secondary | ICD-10-CM | POA: Diagnosis not present

## 2021-10-28 DIAGNOSIS — I509 Heart failure, unspecified: Secondary | ICD-10-CM | POA: Diagnosis not present

## 2021-10-28 DIAGNOSIS — R194 Change in bowel habit: Secondary | ICD-10-CM | POA: Diagnosis not present

## 2021-10-28 DIAGNOSIS — R609 Edema, unspecified: Secondary | ICD-10-CM | POA: Diagnosis not present

## 2021-10-28 DIAGNOSIS — L03115 Cellulitis of right lower limb: Secondary | ICD-10-CM | POA: Diagnosis not present

## 2021-10-31 ENCOUNTER — Telehealth: Payer: Self-pay

## 2021-10-31 NOTE — Telephone Encounter (Signed)
Received call back from patient. Unable to start PREP on 11/05/21 due to leg infection. Would like to be called for next class.  ?Will call her in about 12 wks for start of next night class  ?

## 2021-11-06 DIAGNOSIS — E785 Hyperlipidemia, unspecified: Secondary | ICD-10-CM | POA: Diagnosis not present

## 2021-11-06 DIAGNOSIS — E1169 Type 2 diabetes mellitus with other specified complication: Secondary | ICD-10-CM | POA: Diagnosis not present

## 2021-11-06 DIAGNOSIS — I10 Essential (primary) hypertension: Secondary | ICD-10-CM | POA: Diagnosis not present

## 2021-11-08 ENCOUNTER — Ambulatory Visit (INDEPENDENT_AMBULATORY_CARE_PROVIDER_SITE_OTHER): Payer: Medicare HMO | Admitting: Pharmacist Clinician (PhC)/ Clinical Pharmacy Specialist

## 2021-11-08 ENCOUNTER — Other Ambulatory Visit: Payer: Self-pay

## 2021-11-08 DIAGNOSIS — I10 Essential (primary) hypertension: Secondary | ICD-10-CM | POA: Diagnosis not present

## 2021-11-08 NOTE — Patient Instructions (Signed)
Return for a a follow up appointment April 25 at 3:30 pm ? ?Check your blood pressure at home daily and keep record of the readings. ? ?Take your BP meds as follows: ? Stop valsartan for the next 2 weeks. ? Restart Lisinopril hctz once daily ? Increase amlodipine to 10 mg (2 of the 5 mg tablets) ? Continue with clonidine and nebivolol.   ? ?I will call you in about 2 weeks to see how you are doing with the diarrhea and BP.    ? ?Bring all of your meds, your BP cuff and your record of home blood pressures to your next appointment.  Exercise as you?re able, try to walk approximately 30 minutes per day.  Keep salt intake to a minimum, especially watch canned and prepared boxed foods.  Eat more fresh fruits and vegetables and fewer canned items.  Avoid eating in fast food restaurants.  ? ? HOW TO TAKE YOUR BLOOD PRESSURE: ?Rest 5 minutes before taking your blood pressure. ? Don?t smoke or drink caffeinated beverages for at least 30 minutes before. ?Take your blood pressure before (not after) you eat. ?Sit comfortably with your back supported and both feet on the floor (don?t cross your legs). ?Elevate your arm to heart level on a table or a desk. ?Use the proper sized cuff. It should fit smoothly and snugly around your bare upper arm. There should be enough room to slip a fingertip under the cuff. The bottom edge of the cuff should be 1 inch above the crease of the elbow. ?Ideally, take 3 measurements at one sitting and record the average. ? ? ?

## 2021-11-08 NOTE — Progress Notes (Signed)
? ? ? ?11/12/2021 ?Katelyn Howard ?Aug 27, 1960 ?[id number] ? ? ?HPI:  Katelyn Howard is a 61 y.o. female patient of Dr Duke Salvia, with a PMH below who presents today for advanced hypertension clinic follow up.  When she saw Dr. Duke Salvia in January her BP was noted to be 146/80, down from 151/79 the previous week.   Lisinopril HCTZ was switched to valsartan 320 mg.    She was willing to join our [employer] BP research study and randomized to group 1.  ? ?Today she is in the office for her first follow up visit.   She notes having developed diarrhea since starting valsartan.  Imodium did not resolve this and her PCP gave her a prescription for Linzess.   ? ? ?Past Medical History: ?OSA On CPAP  ?asthma Only using prn albuterol  ?Obesity BMI 59.4 today  ?DM2 A1c on Tresiba, Trulicity, glimepiride   ?  ? ?Blood Pressure Goal:  130/80 ? ?Current Medications: amlodipine 5 mg qd, clonidine 0.1 mg bid, nebivolol 10 mg qd, valsartan 320 mg qd ? ?Family Hx: father had heart disease, mother had stroke; brother with hypertension, one brother with hypertension, double amputee; sister dieceased heart issues w/ diet pills; 2 sons no hypertension at this time ? ?Social Hx: no tobacco, no alcohol, only occasional caffeine ? ?Diet: mix of home and eating out; husband cooks when he's not working; no added salt; vegetables frozen or canned; mix of proteins ? ?Exercise: no PT or regular exercise; PREP notified  - will try to get next class ? ?Home BP readings: 140-160/70's ? ?Intolerances: no cardiac medication intolerances ? ?Labs:  08/2021:  Na 143, K 3.7, Glu 113, BUN 11, SCr 0.7,GFR 103 ? ? ?Wt Readings from Last 3 Encounters:  ?11/08/21 (!) 335 lb (152 kg)  ?09/05/21 (!) 333 lb 11.2 oz (151.4 kg)  ?08/30/21 (!) 338 lb 6.4 oz (153.5 kg)  ? ?BP Readings from Last 3 Encounters:  ?11/08/21 138/78  ?09/05/21 (!) 146/80  ?08/30/21 (!) 151/79  ? ?Pulse Readings from Last 3 Encounters:  ?11/08/21 68  ?09/05/21 71  ?08/30/21 68   ? ? ?Current Outpatient Medications  ?Medication Sig Dispense Refill  ? albuterol (PROVENTIL HFA;VENTOLIN HFA) 108 (90 BASE) MCG/ACT inhaler Inhale 2 puffs into the lungs every 6 (six) hours as needed. For wheezing    ? amLODipine (NORVASC) 5 MG tablet Take 5 mg by mouth daily.    ? aspirin EC 81 MG tablet Take 81 mg by mouth daily.    ? atorvastatin (LIPITOR) 80 MG tablet Take 80 mg by mouth every morning.     ? Cholecalciferol 50000 units capsule Take 50,000 Units by mouth every Thursday.     ? cloNIDine (CATAPRES) 0.1 MG tablet Take 1 tablet (0.1 mg total) by mouth 2 (two) times daily. 60 tablet 11  ? clotrimazole (LOTRIMIN) 1 % cream Apply 1 application topically daily as needed (for rash).    ? clotrimazole-betamethasone (LOTRISONE) cream Apply 1 application topically 2 (two) times daily as needed (for rash).    ? esomeprazole (NEXIUM) 40 MG capsule Take 40 mg by mouth 2 (two) times daily before a meal.     ? fluticasone (CUTIVATE) 0.05 % cream Apply topically 2 (two) times daily. 30 g 1  ? fluticasone (FLONASE) 50 MCG/ACT nasal spray Place 1 spray into both nostrils 2 (two) times daily.    ? folic acid (FOLVITE) 400 MCG tablet Take 400 mcg by mouth daily.    ?  furosemide (LASIX) 40 MG tablet Take 1 tablet (40 mg total) by mouth 2 (two) times daily. 180 tablet 1  ? glimepiride (AMARYL) 4 MG tablet Take 4 mg by mouth daily with breakfast.     ? HYDROcodone-acetaminophen (NORCO/VICODIN) 5-325 MG tablet Take 1 tablet by mouth as directed.    ? hydrOXYzine (ATARAX/VISTARIL) 25 MG tablet Take 1 tablet (25 mg total) by mouth every 6 (six) hours. Prn itching 20 tablet 1  ? insulin degludec (TRESIBA FLEXTOUCH) 200 UNIT/ML FlexTouch Pen INJECT 80 UNITS SUBCUTANEOUSLY IN THE MORNING AND 80 IN THE EVENING INCREASE AS DIRECTED PER CLINIC    ? methocarbamol (ROBAXIN) 500 MG tablet Take 1 tablet by mouth daily.    ? nebivolol (BYSTOLIC) 10 MG tablet Take 10 mg by mouth daily.    ? potassium chloride (K-DUR) 10 MEQ tablet  Take 10 mEq by mouth daily.     ? TRULICITY 1.5 MG/0.5ML SOPN Inject 1.5 mg as directed every Wednesday.     ? valsartan (DIOVAN) 320 MG tablet Take 1 tablet (320 mg total) by mouth daily. 90 tablet 3  ? ?No current facility-administered medications for this visit.  ? ? ?Allergies  ?Allergen Reactions  ? Shrimp [Shellfish Allergy] Anaphylaxis  ? Tetracycline Nausea And Vomiting  ? Penicillins Itching, Swelling and Other (See Comments)  ?  Has patient had a PCN reaction causing immediate rash, facial/tongue/throat swelling, SOB or lightheadedness with hypotension: Unknown ?Has patient had a PCN reaction causing severe rash involving mucus membranes or skin necrosis: Unknown ?Has patient had a PCN reaction that required hospitalization: Unknown ?Has patient had a PCN reaction occurring within the last 10 years: No ?If all of the above answers are "NO", then may proceed with Cephalosporin use. ?  ? ? ?Past Medical History:  ?Diagnosis Date  ? Allergy   ? Arthritis   ? Asthma   ? CHF (congestive heart failure) (HCC) 2008  ? Diabetes mellitus   ? Edema extremities   ? GERD (gastroesophageal reflux disease)   ? Heart failure, type unknown (HCC) 09/05/2021  ? Hyperlipidemia   ? Hypertension   ? Neuromuscular disorder (HCC)   ? neuropathy  ? Obesity   ? OSA (obstructive sleep apnea) 10/13/2016  ? Severe with AHI 44.9/hr now on CPAP at 11cm H2O  ? Sleep apnea   ? wears cpap   ? ? ?Blood pressure 138/78, pulse 68, height  (1.6 m), weight (!) 335 lb (152 kg). ? ?Resistant hypertension ?Patient with resistant hypertension, currently improving on combination of medications.  Because of her ongoing diarrhea, will stop valsartan to see if this is cause.  She will restart lisinopril/hctz 20/25 mg once daily and increase amlodipine to 10 mg daily.  She will continue with clonidine and nebivolol.  Will check in with patient in 2 weeks to see if diarrhea has resolved and see her back in the office in 4 weeks for follow up.  Would  ideally like to discontinue clonidine if we are able to get her on max dose of ACEI/ARB as well as amlodipine.   ? ? ? ?Phillips Hay PharmD CPP [facility] ?[facility] ?[address] ?[place], [place] [address] ?[phone] ?

## 2021-11-12 DIAGNOSIS — D649 Anemia, unspecified: Secondary | ICD-10-CM | POA: Diagnosis not present

## 2021-11-12 DIAGNOSIS — E785 Hyperlipidemia, unspecified: Secondary | ICD-10-CM | POA: Diagnosis not present

## 2021-11-12 DIAGNOSIS — Z0000 Encounter for general adult medical examination without abnormal findings: Secondary | ICD-10-CM | POA: Diagnosis not present

## 2021-11-12 DIAGNOSIS — I509 Heart failure, unspecified: Secondary | ICD-10-CM | POA: Diagnosis not present

## 2021-11-12 DIAGNOSIS — R197 Diarrhea, unspecified: Secondary | ICD-10-CM | POA: Diagnosis not present

## 2021-11-12 DIAGNOSIS — I10 Essential (primary) hypertension: Secondary | ICD-10-CM | POA: Diagnosis not present

## 2021-11-12 DIAGNOSIS — Z1389 Encounter for screening for other disorder: Secondary | ICD-10-CM | POA: Diagnosis not present

## 2021-11-12 DIAGNOSIS — M1990 Unspecified osteoarthritis, unspecified site: Secondary | ICD-10-CM | POA: Diagnosis not present

## 2021-11-12 DIAGNOSIS — E11621 Type 2 diabetes mellitus with foot ulcer: Secondary | ICD-10-CM | POA: Diagnosis not present

## 2021-11-12 DIAGNOSIS — R82998 Other abnormal findings in urine: Secondary | ICD-10-CM | POA: Diagnosis not present

## 2021-11-12 DIAGNOSIS — Z1331 Encounter for screening for depression: Secondary | ICD-10-CM | POA: Diagnosis not present

## 2021-11-12 DIAGNOSIS — G609 Hereditary and idiopathic neuropathy, unspecified: Secondary | ICD-10-CM | POA: Diagnosis not present

## 2021-11-12 DIAGNOSIS — Z23 Encounter for immunization: Secondary | ICD-10-CM | POA: Diagnosis not present

## 2021-11-12 MED ORDER — LISINOPRIL-HYDROCHLOROTHIAZIDE 20-25 MG PO TABS: 1.0000 | ORAL_TABLET | Freq: Every day | ORAL | 3 refills | Status: DC

## 2021-11-12 MED ORDER — AMLODIPINE BESYLATE 10 MG PO TABS: 10.0000 mg | ORAL_TABLET | Freq: Every day | ORAL | 3 refills | Status: DC

## 2021-11-12 NOTE — Assessment & Plan Note (Addendum)
Patient with resistant hypertension, currently improving on combination of medications.  Because of her ongoing diarrhea, will stop valsartan to see if this is cause.  She will restart lisinopril/hctz 20/25 mg once daily and increase amlodipine to 10 mg daily.  She will continue with clonidine and nebivolol.  Will check in with patient in 2 weeks to see if diarrhea has resolved and see her back in the office in 4 weeks for follow up.  Would ideally like to discontinue clonidine if we are able to get her on max dose of ACEI/ARB as well as amlodipine.   ? ? ?

## 2021-11-15 DIAGNOSIS — R197 Diarrhea, unspecified: Secondary | ICD-10-CM | POA: Diagnosis not present

## 2021-11-22 ENCOUNTER — Telehealth: Payer: Self-pay

## 2021-11-22 NOTE — Telephone Encounter (Signed)
Call to pt reference scheduling intake for PREP class starting on 12/02/21 at [facility] ?Can do intake on 11/25/21 at 4pm.  ?Will meet her in lobby.  ?

## 2021-12-10 ENCOUNTER — Ambulatory Visit: Payer: Medicare HMO

## 2021-12-10 NOTE — Progress Notes (Deleted)
12/10/2021 Katelyn Howard [birth year] [id number]   HPI:  Katelyn Howard is a 61 y.o. female patient of Dr Duke Salvia, with a PMH below who presents today for advanced hypertension clinic follow up.  When she saw Dr. Duke Salvia in January her BP was noted to be 146/80, down from 151/79 the previous week.   Lisinopril HCTZ was switched to valsartan 320 mg.    She was willing to join our [employer] BP research study and randomized to group 1.   Today she is in the office for her first follow up visit.   She notes having developed diarrhea since starting valsartan.  Imodium did not resolve this and her PCP gave her a prescription for Linzess.    At last visit, pressure improved to 138/78.  She was having problems with diarrhea and we stopped the valsartan to see if it was the cause.  She instead was givein lisinopril hct 20/25   Past Medical History: OSA On CPAP  asthma Only using prn albuterol  Obesity BMI 59.4 today  DM2 A1c on Tresiba, Trulicity, glimepiride      Blood Pressure Goal:  130/80  Current Medications: amlodipine 5 mg qd, clonidine 0.1 mg bid, nebivolol 10 mg qd, valsartan 320 mg qd  Family Hx: father had heart disease, mother had stroke; brother with hypertension, one brother with hypertension, double amputee; sister dieceased heart issues w/ diet pills; 2 sons no hypertension at this time  Social Hx: no tobacco, no alcohol, only occasional caffeine  Diet: mix of home and eating out; husband cooks when he's not working; no added salt; vegetables frozen or canned; mix of proteins  Exercise: no PT or regular exercise; PREP notified  - will try to get next class  Home BP readings: 140-160/70's  Intolerances: no cardiac medication intolerances  Labs:  08/2021:  Na 143, K 3.7, Glu 113, BUN 11, SCr 0.7,GFR 103   Wt Readings from Last 3 Encounters:  11/08/21 (!) 335 lb (152 kg)  09/05/21 (!) 333 lb 11.2 oz (151.4 kg)  08/30/21 (!) 338 lb 6.4 oz (153.5 kg)   BP  Readings from Last 3 Encounters:  11/08/21 138/78  09/05/21 (!) 146/80  08/30/21 (!) 151/79   Pulse Readings from Last 3 Encounters:  11/08/21 68  09/05/21 71  08/30/21 68    Current Outpatient Medications  Medication Sig Dispense Refill   albuterol (PROVENTIL HFA;VENTOLIN HFA) 108 (90 BASE) MCG/ACT inhaler Inhale 2 puffs into the lungs every 6 (six) hours as needed. For wheezing     amLODipine (NORVASC) 10 MG tablet Take 1 tablet (10 mg total) by mouth daily. 90 tablet 3   aspirin EC 81 MG tablet Take 81 mg by mouth daily.     atorvastatin (LIPITOR) 80 MG tablet Take 80 mg by mouth every morning.      Cholecalciferol 50000 units capsule Take 50,000 Units by mouth every Thursday.      cloNIDine (CATAPRES) 0.1 MG tablet Take 1 tablet (0.1 mg total) by mouth 2 (two) times daily. 60 tablet 11   clotrimazole (LOTRIMIN) 1 % cream Apply 1 application topically daily as needed (for rash).     clotrimazole-betamethasone (LOTRISONE) cream Apply 1 application topically 2 (two) times daily as needed (for rash).     esomeprazole (NEXIUM) 40 MG capsule Take 40 mg by mouth 2 (two) times daily before a meal.      fluticasone (CUTIVATE) 0.05 % cream Apply topically 2 (two) times daily. 30 g  1   fluticasone (FLONASE) 50 MCG/ACT nasal spray Place 1 spray into both nostrils 2 (two) times daily.     folic acid (FOLVITE) 400 MCG tablet Take 400 mcg by mouth daily.     furosemide (LASIX) 40 MG tablet Take 1 tablet (40 mg total) by mouth 2 (two) times daily. 180 tablet 1   glimepiride (AMARYL) 4 MG tablet Take 4 mg by mouth daily with breakfast.      HYDROcodone-acetaminophen (NORCO/VICODIN) 5-325 MG tablet Take 1 tablet by mouth as directed.     hydrOXYzine (ATARAX/VISTARIL) 25 MG tablet Take 1 tablet (25 mg total) by mouth every 6 (six) hours. Prn itching 20 tablet 1   insulin degludec (TRESIBA FLEXTOUCH) 200 UNIT/ML FlexTouch Pen INJECT 80 UNITS SUBCUTANEOUSLY IN THE MORNING AND 80 IN THE EVENING INCREASE  AS DIRECTED PER CLINIC     lisinopril-hydrochlorothiazide (ZESTORETIC) 20-25 MG tablet Take 1 tablet by mouth daily. 90 tablet 3   methocarbamol (ROBAXIN) 500 MG tablet Take 1 tablet by mouth daily.     nebivolol (BYSTOLIC) 10 MG tablet Take 10 mg by mouth daily.     potassium chloride (K-DUR) 10 MEQ tablet Take 10 mEq by mouth daily.      TRULICITY 1.5 MG/0.5ML SOPN Inject 1.5 mg as directed every Wednesday.      No current facility-administered medications for this visit.    Allergies  Allergen Reactions   Shrimp [Shellfish Allergy] Anaphylaxis   Tetracycline Nausea And Vomiting   Penicillins Itching, Swelling and Other (See Comments)    Has patient had a PCN reaction causing immediate rash, facial/tongue/throat swelling, SOB or lightheadedness with hypotension: Unknown Has patient had a PCN reaction causing severe rash involving mucus membranes or skin necrosis: Unknown Has patient had a PCN reaction that required hospitalization: Unknown Has patient had a PCN reaction occurring within the last 10 years: No If all of the above answers are "NO", then may proceed with Cephalosporin use.     Past Medical History:  Diagnosis Date   Allergy    Arthritis    Asthma    CHF (congestive heart failure) (HCC) 2008   Diabetes mellitus    Edema extremities    GERD (gastroesophageal reflux disease)    Heart failure, type unknown (HCC) 09/05/2021   Hyperlipidemia    Hypertension    Neuromuscular disorder (HCC)    neuropathy   Obesity    OSA (obstructive sleep apnea) 10/13/2016   Severe with AHI 44.9/hr now on CPAP at 11cm H2O   Sleep apnea    wears cpap     There were no vitals taken for this visit.  No problem-specific Assessment & Plan notes found for this encounter.   Phillips Hay PharmD CPP [facility] [address] [place], [place] [address] [phone]

## 2021-12-19 ENCOUNTER — Ambulatory Visit (INDEPENDENT_AMBULATORY_CARE_PROVIDER_SITE_OTHER): Payer: Medicare HMO | Admitting: Pharmacist Clinician (PhC)/ Clinical Pharmacy Specialist

## 2021-12-19 ENCOUNTER — Encounter: Payer: Self-pay | Admitting: Pharmacist Clinician (PhC)/ Clinical Pharmacy Specialist

## 2021-12-19 DIAGNOSIS — I10 Essential (primary) hypertension: Secondary | ICD-10-CM

## 2021-12-19 DIAGNOSIS — R0602 Shortness of breath: Secondary | ICD-10-CM | POA: Diagnosis not present

## 2021-12-19 NOTE — Progress Notes (Signed)
? ? ? ?12/20/2021 ?Katelyn MayerPatricia A Howard ?[birth year] ?[id number] ? ? ?HPI:  Katelyn Howard is a 61 y.o. female patient of Dr Duke Salviaandolph, with a PMH below who presents today for advanced hypertension clinic follow up.  When she saw Dr. Duke Salviaandolph in January her BP was noted to be 146/80, down from 151/79 the previous week.   Lisinopril HCTZ was switched to valsartan 320 mg.    She was willing to join our [employer] BP research study and randomized to group 1.   At her first follow up visit she was having problems with diarrhea (since starting the valsartan), so it was switched back to lisinopril/hctz 20/25 once daily and amlodipine was increased to 10 mg daily.   ? ?Today she returns for follow up.  She was confused about instructions at last visit and did not stop valsartan, although did start lisinopril hctz.  So was on both ACE and ARB for the past month.  She does note that the diarrhea has mostly gone and she is feeling better.  She has not checked any home BP readings with the cuff she was given, only stating it's hard to find time.   ? ?Past Medical History: ?OSA On CPAP  ?asthma Only using prn albuterol  ?Obesity BMI 59.4 today  ?DM2 A1c on Tresiba, Trulicity, glimepiride   ?  ? ?Blood Pressure Goal:  130/80 ? ?Current Medications: amlodipine 5 mg qd, clonidine 0.1 mg bid, nebivolol 10 mg qd, valsartan 320 mg qd ? ?Family Hx: father had heart disease, mother had stroke; brother with hypertension, one brother with hypertension, double amputee; sister dieceased heart issues w/ diet pills; 2 sons no hypertension at this time ? ?Social Hx: no tobacco, no alcohol, only occasional caffeine ? ?Diet: mix of home and eating out; husband cooks when he's not working; no added salt; vegetables frozen or canned; mix of proteins ? ?Exercise: no PT or regular exercise; PREP notified  - will try to get next class ? ?Home BP readings: has not checked any readings since last visit ? ?Intolerances: no cardiac medication  intolerances ? ?Labs:  08/2021:  Na 143, K 3.7, Glu 113, BUN 11, SCr 0.7,GFR 103 ? ? ?Wt Readings from Last 3 Encounters:  ?12/19/21 (!) 335 lb (152 kg)  ?11/08/21 (!) 335 lb (152 kg)  ?09/05/21 (!) 333 lb 11.2 oz (151.4 kg)  ? ?BP Readings from Last 3 Encounters:  ?12/19/21 116/76  ?11/08/21 138/78  ?09/05/21 (!) 146/80  ? ?Pulse Readings from Last 3 Encounters:  ?12/19/21 73  ?11/08/21 68  ?09/05/21 71  ? ? ?Current Outpatient Medications  ?Medication Sig Dispense Refill  ? albuterol (PROVENTIL HFA;VENTOLIN HFA) 108 (90 BASE) MCG/ACT inhaler Inhale 2 puffs into the lungs every 6 (six) hours as needed. For wheezing    ? amLODipine (NORVASC) 10 MG tablet Take 1 tablet (10 mg total) by mouth daily. 90 tablet 3  ? aspirin EC 81 MG tablet Take 81 mg by mouth daily.    ? atorvastatin (LIPITOR) 80 MG tablet Take 80 mg by mouth every morning.     ? Cholecalciferol 50000 units capsule Take 50,000 Units by mouth every Thursday.     ? cloNIDine (CATAPRES) 0.1 MG tablet Take 1 tablet (0.1 mg total) by mouth 2 (two) times daily. 60 tablet 11  ? clotrimazole (LOTRIMIN) 1 % cream Apply 1 application topically daily as needed (for rash).    ? esomeprazole (NEXIUM) 40 MG capsule Take 40 mg by mouth 2 (two)  times daily before a meal.     ? fluticasone (CUTIVATE) 0.05 % cream Apply topically 2 (two) times daily. 30 g 1  ? fluticasone (FLONASE) 50 MCG/ACT nasal spray Place 1 spray into both nostrils 2 (two) times daily.    ? folic acid (FOLVITE) 400 MCG tablet Take 400 mcg by mouth daily.    ? furosemide (LASIX) 40 MG tablet Take 1 tablet (40 mg total) by mouth 2 (two) times daily. 180 tablet 1  ? glimepiride (AMARYL) 4 MG tablet Take 4 mg by mouth daily with breakfast.     ? hydrOXYzine (ATARAX/VISTARIL) 25 MG tablet Take 1 tablet (25 mg total) by mouth every 6 (six) hours. Prn itching 20 tablet 1  ? insulin degludec (TRESIBA FLEXTOUCH) 200 UNIT/ML FlexTouch Pen INJECT 80 UNITS SUBCUTANEOUSLY IN THE MORNING AND 80 IN THE EVENING  INCREASE AS DIRECTED PER CLINIC    ? lisinopril-hydrochlorothiazide (ZESTORETIC) 20-25 MG tablet Take 1 tablet by mouth daily. 90 tablet 3  ? methocarbamol (ROBAXIN) 500 MG tablet Take 1 tablet by mouth daily.    ? MOUNJARO 7.5 MG/0.5ML Pen Inject 2 mLs into the skin once a week.    ? mupirocin ointment (BACTROBAN) 2 % Apply 1 application. topically 2 (two) times daily as needed.    ? nebivolol (BYSTOLIC) 10 MG tablet Take 10 mg by mouth daily.    ? nystatin cream (MYCOSTATIN) Apply 1 application. topically 2 (two) times daily as needed.    ? oxyCODONE-acetaminophen (PERCOCET/ROXICET) 5-325 MG tablet Take 1 tablet by mouth every 4 (four) hours as needed for severe pain.    ? potassium chloride (K-DUR) 10 MEQ tablet Take 10 mEq by mouth daily.     ? triamcinolone cream (KENALOG) 0.1 % Apply 1 application. topically 2 (two) times daily as needed.    ? valsartan (DIOVAN) 320 MG tablet Take 1 tablet by mouth daily.    ? ?No current facility-administered medications for this visit.  ? ? ?Allergies  ?Allergen Reactions  ? Shrimp [Shellfish Allergy] Anaphylaxis  ? Tetracycline Nausea And Vomiting  ?  Other reaction(s): Other (See Comments)  ? Metformin   ?  Other reaction(s): Unknown  ? Penicillin G   ?  Other reaction(s): Unknown  ? Penicillins Itching, Swelling and Other (See Comments)  ?  Has patient had a PCN reaction causing immediate rash, facial/tongue/throat swelling, SOB or lightheadedness with hypotension: Unknown ?Has patient had a PCN reaction causing severe rash involving mucus membranes or skin necrosis: Unknown ?Has patient had a PCN reaction that required hospitalization: Unknown ?Has patient had a PCN reaction occurring within the last 10 years: No ?If all of the above answers are "NO", then may proceed with Cephalosporin use. ?  ? ? ?Past Medical History:  ?Diagnosis Date  ? Allergy   ? Arthritis   ? Asthma   ? CHF (congestive heart failure) (HCC) 2008  ? Diabetes mellitus   ? Edema extremities   ? GERD  (gastroesophageal reflux disease)   ? Heart failure, type unknown (HCC) 09/05/2021  ? Hyperlipidemia   ? Hypertension   ? Neuromuscular disorder (HCC)   ? neuropathy  ? Obesity   ? OSA (obstructive sleep apnea) 10/13/2016  ? Severe with AHI 44.9/hr now on CPAP at 11cm H2O  ? Sleep apnea   ? wears cpap   ? ? ?Blood pressure 116/76, pulse 73, resp. rate 15, height  (1.6 m), weight (!) 335 lb (152 kg), SpO2 100 %. ? ?Resistant hypertension ?  Patient with resistant hypertension, well controlled today, however on both ACEI and ARB.  Will have her stop the lisinopril/hct and continue with all other medications.  Printed information clearly in AVS and reiterated several times.  She is scheduled for follow up in another month as part of the [employer] project, then with Dr. Duke Salvia in July.   ? ? ?Phillips Hay PharmD CPP [facility] ?[facility] ?[address] ?[place], [place] [address] ?[phone] ?

## 2021-12-19 NOTE — Patient Instructions (Signed)
?  Go to the lab today ? ?Check your blood pressure at home daily and keep record of the readings. ? ?Take your BP meds as follows: ? STOP LISINOPRIL/HCTZ ? Continue with all other medications ? ?Bring all of your meds, your BP cuff and your record of home blood pressures to your next appointment.  Exercise as you?re able, try to walk approximately 30 minutes per day.  Keep salt intake to a minimum, especially watch canned and prepared boxed foods.  Eat more fresh fruits and vegetables and fewer canned items.  Avoid eating in fast food restaurants.  ? ? HOW TO TAKE YOUR BLOOD PRESSURE: ?Rest 5 minutes before taking your blood pressure. ? Don?t smoke or drink caffeinated beverages for at least 30 minutes before. ?Take your blood pressure before (not after) you eat. ?Sit comfortably with your back supported and both feet on the floor (don?t cross your legs). ?Elevate your arm to heart level on a table or a desk. ?Use the proper sized cuff. It should fit smoothly and snugly around your bare upper arm. There should be enough room to slip a fingertip under the cuff. The bottom edge of the cuff should be 1 inch above the crease of the elbow. ?Ideally, take 3 measurements at one sitting and record the average. ? ? ?

## 2021-12-20 ENCOUNTER — Encounter: Payer: Self-pay | Admitting: Pharmacist Clinician (PhC)/ Clinical Pharmacy Specialist

## 2021-12-20 LAB — BASIC METABOLIC PANEL
BUN/Creatinine Ratio: 23 (ref 12–28)
BUN: 27 mg/dL (ref 8–27)
CO2: 26 mmol/L (ref 20–29)
Calcium: 9.8 mg/dL (ref 8.7–10.3)
Chloride: 99 mmol/L (ref 96–106)
Creatinine, Ser: 1.15 mg/dL — ABNORMAL HIGH (ref 0.57–1.00)
Glucose: 106 mg/dL — ABNORMAL HIGH (ref 70–99)
Potassium: 4.8 mmol/L (ref 3.5–5.2)
Sodium: 141 mmol/L (ref 134–144)
eGFR: 55 mL/min/{1.73_m2} — ABNORMAL LOW (ref >59)

## 2021-12-20 LAB — BRAIN NATRIURETIC PEPTIDE: BNP: 39 pg/mL (ref 0.0–100.0)

## 2021-12-20 LAB — CORTISOL: Cortisol: 6.7 ug/dL (ref 6.2–19.4)

## 2021-12-20 NOTE — Assessment & Plan Note (Signed)
Patient with resistant hypertension, well controlled today, however on both ACEI and ARB.  Will have her stop the lisinopril/hct and continue with all other medications.  Printed information clearly in AVS and reiterated several times.  She is scheduled for follow up in another month as part of the [employer] project, then with Dr. Oval Linsey in July.   ?

## 2022-01-06 ENCOUNTER — Telehealth (HOSPITAL_BASED_OUTPATIENT_CLINIC_OR_DEPARTMENT_OTHER): Payer: Self-pay | Admitting: *Deleted

## 2022-01-06 DIAGNOSIS — N289 Disorder of kidney and ureter, unspecified: Secondary | ICD-10-CM

## 2022-01-06 DIAGNOSIS — Z5181 Encounter for therapeutic drug level monitoring: Secondary | ICD-10-CM

## 2022-01-06 NOTE — Telephone Encounter (Signed)
-----   Message from Chilton Si, MD sent at 01/05/2022  9:31 PM EDT ----- Cortisol levels are normal.  Kidney function mildly abnormal.  This may be because she was on both the ACE-I and ARB.  We will repeat her labs at follow up.

## 2022-01-06 NOTE — Telephone Encounter (Signed)
Advised patient of lab results  Per patient Lisinopril d/c at visit with Pharm D  Advised patient to continue Pharm D recommendations and keep visit in June.  Will recheck labs at that time

## 2022-01-16 ENCOUNTER — Ambulatory Visit (INDEPENDENT_AMBULATORY_CARE_PROVIDER_SITE_OTHER): Payer: Medicare HMO | Admitting: Pharmacist Clinician (PhC)/ Clinical Pharmacy Specialist

## 2022-01-16 DIAGNOSIS — I10 Essential (primary) hypertension: Secondary | ICD-10-CM

## 2022-01-16 NOTE — Assessment & Plan Note (Signed)
Patient with essential hypertension, now doing well on combination of 4 medications.  She is tolerating all of them without concern and states no problems with compliance.  No changes to medications today.  Did ask her to get blood draw to be sure kidney function has returned to normal, she will get this done next week.  Final research appointment with Dr. Duke Salvia set for August 24 at the [facility] office

## 2022-01-16 NOTE — Patient Instructions (Signed)
Return for a a follow up appointment with Dr. Oval Linsey on August 24 at 3 pm  Go to the lab in the next week to check kidney function  Take your BP meds as follows:  Continue with all current medications  Bring all of your meds, your BP cuff and your record of home blood pressures to your next appointment.  Exercise as you're able, try to walk approximately 30 minutes per day.  Keep salt intake to a minimum, especially watch canned and prepared boxed foods.  Eat more fresh fruits and vegetables and fewer canned items.  Avoid eating in fast food restaurants.    HOW TO TAKE YOUR BLOOD PRESSURE: Rest 5 minutes before taking your blood pressure.  Don't smoke or drink caffeinated beverages for at least 30 minutes before. Take your blood pressure before (not after) you eat. Sit comfortably with your back supported and both feet on the floor (don't cross your legs). Elevate your arm to heart level on a table or a desk. Use the proper sized cuff. It should fit smoothly and snugly around your bare upper arm. There should be enough room to slip a fingertip under the cuff. The bottom edge of the cuff should be 1 inch above the crease of the elbow. Ideally, take 3 measurements at one sitting and record the average.

## 2022-01-16 NOTE — Progress Notes (Unsigned)
01/16/2022 IXCHEL MCGUYER Oct 08, 1960 [id number]   HPI:  Katelyn Howard is a 61 y.o. female patient of Dr Duke Salvia, with a PMH below who presents today for advanced hypertension clinic follow up.  When she saw Dr. Duke Salvia in January her BP was noted to be 146/80, down from 151/79 the previous week.   Lisinopril HCTZ was switched to valsartan 320 mg.    She was willing to join our [employer] BP research study and randomized to group 1.   At her first follow up visit she was having problems with diarrhea (since starting the valsartan), so it was switched back to lisinopril/hctz 20/25 once daily and amlodipine was increased to 10 mg daily.  At follow up we realized that there had been some confusion and she was taking both valsartan and lisinopril hct.  Diarrhea had cleared up, so we discontinued the lisinopril/hctz.    Today she returns for follow up.  She has never brought any home readings with her, noting that it's hard to find time to do this.  This remains true today.  She states no problems with compliance, purchasing her medications or getting to the pharmacy.    Past Medical History: OSA On CPAP  asthma Only using prn albuterol  Obesity BMI 59.4 today  DM2 A1c on Tresiba, Trulicity, glimepiride     Blood Pressure Goal:  130/80  Current Medications: amlodipine 5 mg qd, clonidine 0.1 mg bid, nebivolol 10 mg qd, valsartan 320 mg qd  Family Hx: father had heart disease, mother had stroke; brother with hypertension, one brother with hypertension, double amputee; sister dieceased heart issues w/ diet pills; 2 sons no hypertension at this time  Social Hx: no tobacco, no alcohol, only occasional caffeine  Diet: mix of home and eating out; husband cooks when he's not working; no added salt; vegetables frozen or canned; mix of proteins; tries to watch what she eats  Exercise: no PT or regular exercise - states not enough time  Home BP readings: has not checked any readings since  last visit  Intolerances: no cardiac medication intolerances  Labs:  08/2021:  Na 143, K 3.7, Glu 113, BUN 11, SCr 0.7,GFR 103   Wt Readings from Last 3 Encounters:  12/19/21 (!) 335 lb (152 kg)  11/08/21 (!) 335 lb (152 kg)  09/05/21 (!) 333 lb 11.2 oz (151.4 kg)   BP Readings from Last 3 Encounters:  01/16/22 129/76  12/19/21 116/76  11/08/21 138/78   Pulse Readings from Last 3 Encounters:  01/16/22 65  12/19/21 73  11/08/21 68    Current Outpatient Medications  Medication Sig Dispense Refill   albuterol (PROVENTIL HFA;VENTOLIN HFA) 108 (90 BASE) MCG/ACT inhaler Inhale 2 puffs into the lungs every 6 (six) hours as needed. For wheezing     amLODipine (NORVASC) 10 MG tablet Take 1 tablet (10 mg total) by mouth daily. 90 tablet 3   aspirin EC 81 MG tablet Take 81 mg by mouth daily.     atorvastatin (LIPITOR) 80 MG tablet Take 80 mg by mouth every morning.      Cholecalciferol 50000 units capsule Take 50,000 Units by mouth every Thursday.      cloNIDine (CATAPRES) 0.1 MG tablet Take 1 tablet (0.1 mg total) by mouth 2 (two) times daily. 60 tablet 11   clotrimazole (LOTRIMIN) 1 % cream Apply 1 application topically daily as needed (for rash).     esomeprazole (NEXIUM) 40 MG capsule Take 40 mg by mouth  2 (two) times daily before a meal.      fluticasone (CUTIVATE) 0.05 % cream Apply topically 2 (two) times daily. 30 g 1   fluticasone (FLONASE) 50 MCG/ACT nasal spray Place 1 spray into both nostrils 2 (two) times daily.     folic acid (FOLVITE) 400 MCG tablet Take 400 mcg by mouth daily.     furosemide (LASIX) 40 MG tablet Take 1 tablet (40 mg total) by mouth 2 (two) times daily. 180 tablet 1   glimepiride (AMARYL) 4 MG tablet Take 4 mg by mouth daily with breakfast.      hydrOXYzine (ATARAX/VISTARIL) 25 MG tablet Take 1 tablet (25 mg total) by mouth every 6 (six) hours. Prn itching 20 tablet 1   insulin degludec (TRESIBA FLEXTOUCH) 200 UNIT/ML FlexTouch Pen INJECT 80 UNITS  SUBCUTANEOUSLY IN THE MORNING AND 80 IN THE EVENING INCREASE AS DIRECTED PER CLINIC     methocarbamol (ROBAXIN) 500 MG tablet Take 1 tablet by mouth daily.     MOUNJARO 7.5 MG/0.5ML Pen Inject 2 mLs into the skin once a week.     mupirocin ointment (BACTROBAN) 2 % Apply 1 application. topically 2 (two) times daily as needed.     nebivolol (BYSTOLIC) 10 MG tablet Take 10 mg by mouth daily.     nystatin cream (MYCOSTATIN) Apply 1 application. topically 2 (two) times daily as needed.     oxyCODONE-acetaminophen (PERCOCET/ROXICET) 5-325 MG tablet Take 1 tablet by mouth every 4 (four) hours as needed for severe pain.     potassium chloride (K-DUR) 10 MEQ tablet Take 10 mEq by mouth daily.      triamcinolone cream (KENALOG) 0.1 % Apply 1 application. topically 2 (two) times daily as needed.     valsartan (DIOVAN) 320 MG tablet Take 1 tablet by mouth daily.     No current facility-administered medications for this visit.    Allergies  Allergen Reactions   Shrimp [Shellfish Allergy] Anaphylaxis   Tetracycline Nausea And Vomiting    Other reaction(s): Other (See Comments)   Metformin     Other reaction(s): Unknown   Penicillin G     Other reaction(s): Unknown   Penicillins Itching, Swelling and Other (See Comments)    Has patient had a PCN reaction causing immediate rash, facial/tongue/throat swelling, SOB or lightheadedness with hypotension: Unknown Has patient had a PCN reaction causing severe rash involving mucus membranes or skin necrosis: Unknown Has patient had a PCN reaction that required hospitalization: Unknown Has patient had a PCN reaction occurring within the last 10 years: No If all of the above answers are "NO", then may proceed with Cephalosporin use.     Past Medical History:  Diagnosis Date   Allergy    Arthritis    Asthma    CHF (congestive heart failure) (HCC) 2008   Diabetes mellitus    Edema extremities    GERD (gastroesophageal reflux disease)    Heart failure,  type unknown (HCC) 09/05/2021   Hyperlipidemia    Hypertension    Neuromuscular disorder (HCC)    neuropathy   Obesity    OSA (obstructive sleep apnea) 10/13/2016   Severe with AHI 44.9/hr now on CPAP at 11cm H2O   Sleep apnea    wears cpap     Blood pressure 129/76, pulse 65, resp. rate 18, height 5\' 3"  (1.6 m), SpO2 100 %.  Resistant hypertension Patient with essential hypertension, now doing well on combination of 4 medications.  She is tolerating all of them without  concern and states no problems with compliance.  No changes to medications today.  Did ask her to get blood draw to be sure kidney function has returned to normal, she will get this done next week.  Final research appointment with Dr. Duke Salvia set for August 24 at the [facility] office   Phillips Hay PharmD CPP [facility] [address] [place], [place] [address] [phone]

## 2022-01-20 ENCOUNTER — Other Ambulatory Visit: Payer: Self-pay | Admitting: Internal Medicine

## 2022-01-20 DIAGNOSIS — Z1231 Encounter for screening mammogram for malignant neoplasm of breast: Secondary | ICD-10-CM

## 2022-01-29 ENCOUNTER — Ambulatory Visit
Admission: RE | Admit: 2022-01-29 | Discharge: 2022-01-29 | Disposition: A | Discharge status: Home / Self Care | Payer: Medicare HMO | Source: Ambulatory Visit | Attending: Internal Medicine | Admitting: Internal Medicine

## 2022-01-29 DIAGNOSIS — Z1231 Encounter for screening mammogram for malignant neoplasm of breast: Secondary | ICD-10-CM

## 2022-02-10 ENCOUNTER — Other Ambulatory Visit (HOSPITAL_BASED_OUTPATIENT_CLINIC_OR_DEPARTMENT_OTHER): Payer: Self-pay | Admitting: Cardiovascular Disease

## 2022-02-11 DIAGNOSIS — J449 Chronic obstructive pulmonary disease, unspecified: Secondary | ICD-10-CM | POA: Diagnosis not present

## 2022-02-22 ENCOUNTER — Other Ambulatory Visit (HOSPITAL_BASED_OUTPATIENT_CLINIC_OR_DEPARTMENT_OTHER): Payer: Self-pay | Admitting: Cardiovascular Disease

## 2022-02-26 DIAGNOSIS — I10 Essential (primary) hypertension: Secondary | ICD-10-CM | POA: Diagnosis not present

## 2022-02-26 DIAGNOSIS — D649 Anemia, unspecified: Secondary | ICD-10-CM | POA: Diagnosis not present

## 2022-02-26 DIAGNOSIS — E11621 Type 2 diabetes mellitus with foot ulcer: Secondary | ICD-10-CM | POA: Diagnosis not present

## 2022-02-26 DIAGNOSIS — J45909 Unspecified asthma, uncomplicated: Secondary | ICD-10-CM | POA: Diagnosis not present

## 2022-02-26 DIAGNOSIS — G609 Hereditary and idiopathic neuropathy, unspecified: Secondary | ICD-10-CM | POA: Diagnosis not present

## 2022-02-26 DIAGNOSIS — E785 Hyperlipidemia, unspecified: Secondary | ICD-10-CM | POA: Diagnosis not present

## 2022-02-26 DIAGNOSIS — M069 Rheumatoid arthritis, unspecified: Secondary | ICD-10-CM | POA: Diagnosis not present

## 2022-02-26 DIAGNOSIS — G4733 Obstructive sleep apnea (adult) (pediatric): Secondary | ICD-10-CM | POA: Diagnosis not present

## 2022-02-26 DIAGNOSIS — E538 Deficiency of other specified B group vitamins: Secondary | ICD-10-CM | POA: Diagnosis not present

## 2022-02-26 DIAGNOSIS — I509 Heart failure, unspecified: Secondary | ICD-10-CM | POA: Diagnosis not present

## 2022-02-26 DIAGNOSIS — R609 Edema, unspecified: Secondary | ICD-10-CM | POA: Diagnosis not present

## 2022-02-26 DIAGNOSIS — E1169 Type 2 diabetes mellitus with other specified complication: Secondary | ICD-10-CM | POA: Diagnosis not present

## 2022-03-06 DIAGNOSIS — J449 Chronic obstructive pulmonary disease, unspecified: Secondary | ICD-10-CM | POA: Diagnosis not present

## 2022-04-10 ENCOUNTER — Ambulatory Visit (HOSPITAL_BASED_OUTPATIENT_CLINIC_OR_DEPARTMENT_OTHER): Payer: Medicare HMO | Admitting: Cardiovascular Disease

## 2022-04-10 DIAGNOSIS — J449 Chronic obstructive pulmonary disease, unspecified: Secondary | ICD-10-CM | POA: Diagnosis not present

## 2022-05-19 ENCOUNTER — Other Ambulatory Visit (HOSPITAL_BASED_OUTPATIENT_CLINIC_OR_DEPARTMENT_OTHER): Payer: Self-pay | Admitting: Cardiovascular Disease

## 2022-05-20 NOTE — Telephone Encounter (Signed)
Rx request sent to pharmacy.  

## 2022-06-30 DIAGNOSIS — E1169 Type 2 diabetes mellitus with other specified complication: Secondary | ICD-10-CM | POA: Diagnosis not present

## 2022-06-30 DIAGNOSIS — J45909 Unspecified asthma, uncomplicated: Secondary | ICD-10-CM | POA: Diagnosis not present

## 2022-06-30 DIAGNOSIS — D649 Anemia, unspecified: Secondary | ICD-10-CM | POA: Diagnosis not present

## 2022-06-30 DIAGNOSIS — I509 Heart failure, unspecified: Secondary | ICD-10-CM | POA: Diagnosis not present

## 2022-06-30 DIAGNOSIS — E785 Hyperlipidemia, unspecified: Secondary | ICD-10-CM | POA: Diagnosis not present

## 2022-06-30 DIAGNOSIS — I10 Essential (primary) hypertension: Secondary | ICD-10-CM | POA: Diagnosis not present

## 2022-06-30 DIAGNOSIS — Z23 Encounter for immunization: Secondary | ICD-10-CM | POA: Diagnosis not present

## 2022-06-30 DIAGNOSIS — M069 Rheumatoid arthritis, unspecified: Secondary | ICD-10-CM | POA: Diagnosis not present

## 2022-06-30 DIAGNOSIS — E11621 Type 2 diabetes mellitus with foot ulcer: Secondary | ICD-10-CM | POA: Diagnosis not present

## 2022-07-08 DIAGNOSIS — R700 Elevated erythrocyte sedimentation rate: Secondary | ICD-10-CM | POA: Diagnosis not present

## 2022-07-08 DIAGNOSIS — M791 Myalgia, unspecified site: Secondary | ICD-10-CM | POA: Diagnosis not present

## 2022-07-08 DIAGNOSIS — I509 Heart failure, unspecified: Secondary | ICD-10-CM | POA: Diagnosis not present

## 2022-07-08 DIAGNOSIS — M0579 Rheumatoid arthritis with rheumatoid factor of multiple sites without organ or systems involvement: Secondary | ICD-10-CM | POA: Diagnosis not present

## 2022-07-08 DIAGNOSIS — L0390 Cellulitis, unspecified: Secondary | ICD-10-CM | POA: Diagnosis not present

## 2022-07-08 DIAGNOSIS — Z79899 Other long term (current) drug therapy: Secondary | ICD-10-CM | POA: Diagnosis not present

## 2022-07-08 DIAGNOSIS — M1990 Unspecified osteoarthritis, unspecified site: Secondary | ICD-10-CM | POA: Diagnosis not present

## 2022-07-08 DIAGNOSIS — R748 Abnormal levels of other serum enzymes: Secondary | ICD-10-CM | POA: Diagnosis not present

## 2022-07-21 ENCOUNTER — Telehealth (HOSPITAL_BASED_OUTPATIENT_CLINIC_OR_DEPARTMENT_OTHER): Payer: Self-pay | Admitting: *Deleted

## 2022-07-21 ENCOUNTER — Ambulatory Visit (INDEPENDENT_AMBULATORY_CARE_PROVIDER_SITE_OTHER): Payer: Medicare HMO | Admitting: Cardiovascular Disease

## 2022-07-21 ENCOUNTER — Encounter (HOSPITAL_BASED_OUTPATIENT_CLINIC_OR_DEPARTMENT_OTHER): Payer: Self-pay | Admitting: Cardiovascular Disease

## 2022-07-21 VITALS — BP 121/74 | HR 65 | Ht 63.0 in | Wt 328.4 lb

## 2022-07-21 DIAGNOSIS — G4733 Obstructive sleep apnea (adult) (pediatric): Secondary | ICD-10-CM

## 2022-07-21 DIAGNOSIS — E7800 Pure hypercholesterolemia, unspecified: Secondary | ICD-10-CM

## 2022-07-21 DIAGNOSIS — I1A0 Resistant hypertension: Secondary | ICD-10-CM | POA: Diagnosis not present

## 2022-07-21 NOTE — Progress Notes (Signed)
Cardiology Office Visit   Date:  07/21/2022   ID:  Katelyn Howard, DOB 1961/02/10, MRN [id number]  PCP:  Rodrigo Ran, MD  Cardiologist:   Chilton Si, MD  No chief complaint on file.    History of Present Illness: Katelyn Howard is a 61 y.o. female with OSA on CPAP, asthma, morbid obesity and diabetes who is being seen today for follow-up in the [facility]. She initially reported shortness of breath. She saw her PCP and increased lasix from 40 mg daily to 40 mg twice daily with some improvement.  She did note LE edema, orthopnea and PND.  Since starting lasix her breathing improved but is not back at baseline.  She doesn't have a scale but notes that her clothes are tighter.  She denies chest pain, though she notes that she can't exercise much due to back pain.  She uses a cane for walking.  She doesn't limit sodium intake or follow a particular diet.    Her blood pressure was uncontrolled. HCTZ was stopped and lasix was increased to 40 mg twice daily. She was started on valsartan and referred to the PREP program.  At the last visit, she had not yet started Valsartan and her blood pressure remained above goal. She had renal dopplers that were normal. Echo 09/06/21 revealed LVF 60-6% with grade I diastolic dysfunction. She was enrolled in a patient monitoring study and reported diarrhea after starting Valsartan. She was switched back to losartan/HCTZ and amlodipine was increased to 10mg . Blood pressure was better controlled at follow-up.  She has been doing well overall. She noted that she is still experiencing LE edema.  She saw vascular doctor who recommended compression socks but she is unable to tolerate them because they are too tight on her ankles.  She has been experiencing some wheezing. In regards to her physical activity, she hasn't been going to the gym. She hasn't been checking her blood pressures at home. Her cholesterol levels in July with an LDL of  156 were elevated, she noted she was on the atorvastatin then. She is currently on the atorvastatin.     Past Medical History:  Diagnosis Date   Allergy    Arthritis    Asthma    CHF (congestive heart failure) (HCC) 2008   Diabetes mellitus    Edema extremities    GERD (gastroesophageal reflux disease)    Heart failure, type unknown (HCC) 09/05/2021   Hyperlipidemia    Hypertension    Neuromuscular disorder (HCC)    neuropathy   Obesity    OSA (obstructive sleep apnea) 10/13/2016   Severe with AHI 44.9/hr now on CPAP at 11cm H2O   Sleep apnea    wears cpap     Past Surgical History:  Procedure Laterality Date   ABDOMINAL HYSTERECTOMY     EYE SURGERY       Current Outpatient Medications  Medication Sig Dispense Refill   albuterol (PROVENTIL HFA;VENTOLIN HFA) 108 (90 BASE) MCG/ACT inhaler Inhale 2 puffs into the lungs every 6 (six) hours as needed. For wheezing     amLODipine (NORVASC) 10 MG tablet Take 1 tablet (10 mg total) by mouth daily. 90 tablet 3   aspirin EC 81 MG tablet Take 81 mg by mouth daily.     atorvastatin (LIPITOR) 80 MG tablet Take 80 mg by mouth every morning.      Cholecalciferol 50000 units capsule Take 50,000 Units by mouth every Thursday.  cloNIDine (CATAPRES) 0.1 MG tablet Take 1 tablet (0.1 mg total) by mouth 2 (two) times daily. 60 tablet 11   clotrimazole (LOTRIMIN) 1 % cream Apply 1 application topically daily as needed (for rash).     esomeprazole (NEXIUM) 40 MG capsule Take 40 mg by mouth 2 (two) times daily before a meal.      fluticasone (CUTIVATE) 0.05 % cream Apply topically 2 (two) times daily. 30 g 1   fluticasone (FLONASE) 50 MCG/ACT nasal spray Place 1 spray into both nostrils 2 (two) times daily.     folic acid (FOLVITE) 400 MCG tablet Take 400 mcg by mouth daily.     furosemide (LASIX) 40 MG tablet Take 1 tablet (40 mg total) by mouth 2 (two) times daily. 180 tablet 0   glimepiride (AMARYL) 4 MG tablet Take 4 mg by mouth daily with  breakfast.      hydrOXYzine (ATARAX/VISTARIL) 25 MG tablet Take 1 tablet (25 mg total) by mouth every 6 (six) hours. Prn itching 20 tablet 1   insulin degludec (TRESIBA FLEXTOUCH) 200 UNIT/ML FlexTouch Pen INJECT 80 UNITS SUBCUTANEOUSLY IN THE MORNING AND 80 IN THE EVENING INCREASE AS DIRECTED PER CLINIC     methocarbamol (ROBAXIN) 500 MG tablet Take 1 tablet by mouth daily.     MOUNJARO 7.5 MG/0.5ML Pen Inject 2 mLs into the skin once a week.     mupirocin ointment (BACTROBAN) 2 % Apply 1 application. topically 2 (two) times daily as needed.     nebivolol (BYSTOLIC) 10 MG tablet Take 10 mg by mouth daily.     nystatin cream (MYCOSTATIN) Apply 1 application. topically 2 (two) times daily as needed.     oxyCODONE-acetaminophen (PERCOCET/ROXICET) 5-325 MG tablet Take 1 tablet by mouth every 4 (four) hours as needed for severe pain.     potassium chloride (K-DUR) 10 MEQ tablet Take 10 mEq by mouth daily.      triamcinolone cream (KENALOG) 0.1 % Apply 1 application. topically 2 (two) times daily as needed.     valsartan (DIOVAN) 320 MG tablet Take 1 tablet by mouth daily.     No current facility-administered medications for this visit.    Allergies:   Shrimp [shellfish allergy], Tetracycline, Metformin, Penicillin g, and Penicillins    Social History:  The patient  reports that she has never smoked. She has never used smokeless tobacco. She reports that she does not drink alcohol and does not use drugs.   Family History:  The patient's family history includes Arthritis in her father; Diabetes in her brother and sister; Gout in her father; Heart attack in her sister; Heart disease in her father; Heart failure in her father; Hypertension in her brother, father, and sister; Stroke in her brother and mother.    ROS:  Please see the history of present illness.  (+) LE edema (+) Wheezing All other systems are reviewed and negative.   PHYSICAL EXAM: VS:  BP 121/74 (BP Location: Left Arm, Patient  Position: Sitting, Cuff Size: Large)   Pulse 65   Ht 5\' 3"  (1.6 m)   Wt (!) 328 lb 6.4 oz (149 kg)   BMI 58.17 kg/m  , BMI Body mass index is 58.17 kg/m. GENERAL:  Well appearing HEENT:  Pupils equal round and reactive, fundi not visualized, oral mucosa unremarkable NECK:  No jugular venous distention, waveform within normal limits, carotid upstroke brisk and symmetric, no bruits, no thyromegaly LUNGS:  Clear to auscultation bilaterally HEART:  RRR.  PMI not  displaced or sustained,S1 and S2 within normal limits, no S3, no S4, no clicks, no rubs, no murmurs ABD:  Flat, positive bowel sounds normal in frequency in pitch, no bruits, no rebound, no guarding, no midline pulsatile mass, no hepatomegaly, no splenomegaly EXT:  2 plus pulses throughout, bilateral lymphedema, no cyanosis no clubbing SKIN:  No rashes no nodules NEURO:  Cranial nerves II through XII grossly intact, motor grossly intact throughout PSYCH:  Cognitively intact, oriented to person place and time  Echo 06/23/2016 - Left ventricle: The cavity size was normal. Systolic function was    normal. The estimated ejection fraction was in the range of 55%    to 60%. Wall motion was normal; there were no regional wall    motion abnormalities. The transmitral flow pattern was normal.    The pulmonary vein flow pattern was normal. Left ventricular    diastolic function parameters were mildly abnormal for age.    Indeterminate filling pressure by Doppler parameters.  - Atrial septum: No defect or patent foramen ovale was identified.   EKG:  EKG was not ordered today 07/21/22: Sinus rhythm. Rate 65 bpm. First Degree AV Block 08/30/21: sinus rhythm.  Rate 68 bpm  Recent Labs: 12/19/2021: BNP 39.0; BUN 27; Creatinine, Ser 1.15; Potassium 4.8; Sodium 141   08/23/2021: Sodium 143, potassium 3.7, BUN 11, creatinine 0.7 WBC 9, hemoglobin 11.8, hematocrit 37.4, platelets 256  Lipid Panel    Component Value Date/Time   CHOL  05/25/2009  0605    169        ATP III CLASSIFICATION:  <200     mg/dL   Desirable  789-381  mg/dL   Borderline High  >=017    mg/dL   High          TRIG 510 05/25/2009 0605   HDL 44 05/25/2009 0605   CHOLHDL 3.8 05/25/2009 0605   VLDL 20 05/25/2009 0605   LDLCALC (H) 05/25/2009 0605    105        Total Cholesterol/HDL:CHD Risk Coronary Heart Disease Risk Table                     Men   Women  1/2 Average Risk   3.4   3.3  Average Risk       5.0   4.4  2 X Average Risk   9.6   7.1  3 X Average Risk  23.4   11.0        Use the calculated Patient Ratio above and the CHD Risk Table to determine the patient's CHD Risk.        ATP III CLASSIFICATION (LDL):  <100     mg/dL   Optimal  258-527  mg/dL   Near or Above                    Optimal  130-159  mg/dL   Borderline  782-423  mg/dL   High  >536     mg/dL   Very High      Wt Readings from Last 3 Encounters:  07/21/22 (!) 328 lb 6.4 oz (149 kg)  12/19/21 (!) 335 lb (152 kg)  11/08/21 (!) 335 lb (152 kg)      ASSESSMENT AND PLAN:  Resistant hypertension Blood pressure has been much better controlled.  She hasn't been checking it at home.  She notes that when she goes to her physician offices it has been well controlled.  Encouraged to  work on increasing her exercise.  No secondary causes identified other than lifestyle.  Continue with amlodipine, clonidine, nebivolol, and valsartan.  OSA (obstructive sleep apnea) Continue CPAP.  HYPERCHOLESTEROLEMIA, PURE Lipids are very poorly controlled.  She notes that her PCP started her on injection medicine and she does not think that it is working properly.  Atorvastatin is on her list but she does not think she is taking it.  We will try to call pharmacy and see if she is actually taking this medication.  If she is then would consider referring her to lipid clinic for inclisiran.  She thinks she would do much better with the twice a year therapy.  If she is not taking the atorvastatin then we  need to try a different statin and repeat lipids and a CMP before thinking of alternative therapies.    Current medicines are reviewed at length with the patient today.  The patient has concerns regarding medicines.  The following changes have been made:  Stop HCTZ and lisinopril.  Add valsartan.   Labs/ tests ordered today include:   Orders Placed This Encounter  Procedures   Lipid panel   Comprehensive metabolic panel   EKG 12-Lead    She consents to be monitored in our remote patient monitoring program through [employer].  she will track his blood pressure twice daily and understands that these trends will help [place] to adjust her medications as needed prior to his next appointment.  She is interested in enrolling in the PREP exercise and nutrition program through the [facility].    Disposition:    FU with Jazzalynn Rhudy C. Duke Salvia, MD, [facility] in 13-months   I,Danny Valdes,acting as a scribe for Chilton Si, MD.,have documented all relevant documentation on the behalf of Chilton Si, MD,as directed by  Chilton Si, MD while in the presence of Chilton Si, MD.  Signed, Selmer Adduci C. Duke Salvia, MD, [facility] [address]  07/21/2022 11:36 AM    [facility]

## 2022-07-21 NOTE — Patient Instructions (Signed)
Medication Instructions:  Your physician recommends that you continue on your current medications as directed. Please refer to the Current Medication list given to you today.   *If you need a refill on your cardiac medications before your next appointment, please call your pharmacy*  Lab Work: FASTING LP/CMET SOON  If you have labs (blood work) drawn today and your tests are completely normal, you will receive your results only by: MyChart Message (if you have MyChart) OR A paper copy in the mail If you have any lab test that is abnormal or we need to change your treatment, we will call you to review the results.  Testing/Procedures: NONE  Follow-Up: At [facility], you and your health needs are our priority.  As part of our continuing mission to provide you with exceptional heart care, we have created designated Provider Care Teams.  These Care Teams include your primary Cardiologist (physician) and Advanced Practice Providers (APPs -  Physician Assistants and Nurse Practitioners) who all work together to provide you with the care you need, when you need it.  We recommend signing up for the patient portal called "MyChart".  Sign up information is provided on this After Visit Summary.  MyChart is used to connect with patients for Virtual Visits (Telemedicine).  Patients are able to view lab/test results, encounter notes, upcoming appointments, etc.  Non-urgent messages can be sent to your provider as well.   To learn more about what you can do with MyChart, go to [email or web address].    Your next appointment:   6 month(s)  The format for your next appointment:   In Person  Provider:   Chilton Si, MD    CHECK YOUR INJECTABLES AT HOME AND LET us KNOW WHAT YOU ARE TAKING

## 2022-07-21 NOTE — Assessment & Plan Note (Signed)
Blood pressure has been much better controlled.  She hasn't been checking it at home.  She notes that when she goes to her physician offices it has been well controlled.  Encouraged to work on increasing her exercise.  No secondary causes identified other than lifestyle.  Continue with amlodipine, clonidine, nebivolol, and valsartan.

## 2022-07-21 NOTE — Assessment & Plan Note (Signed)
Lipids are very poorly controlled.  She notes that her PCP started her on injection medicine and she does not think that it is working properly.  Atorvastatin is on her list but she does not think she is taking it.  We will try to call pharmacy and see if she is actually taking this medication.  If she is then would consider referring her to lipid clinic for inclisiran.  She thinks she would do much better with the twice a year therapy.  If she is not taking the atorvastatin then we need to try a different statin and repeat lipids and a CMP before thinking of alternative therapies.

## 2022-07-21 NOTE — Assessment & Plan Note (Signed)
Continue CPAP.  

## 2022-07-21 NOTE — Telephone Encounter (Signed)
Patient returned [facility] blood pressure machine from [facility]

## 2022-07-22 LAB — COMPREHENSIVE METABOLIC PANEL
ALT: 25 IU/L (ref 0–32)
AST: 23 IU/L (ref 0–40)
Albumin/Globulin Ratio: 1.2 (ref 1.2–2.2)
Albumin: 4.1 g/dL (ref 3.9–4.9)
Alkaline Phosphatase: 143 IU/L — ABNORMAL HIGH (ref 44–121)
BUN/Creatinine Ratio: 11 — ABNORMAL LOW (ref 12–28)
BUN: 9 mg/dL (ref 8–27)
Bilirubin Total: 0.3 mg/dL (ref 0.0–1.2)
CO2: 24 mmol/L (ref 20–29)
Calcium: 9.5 mg/dL (ref 8.7–10.3)
Chloride: 101 mmol/L (ref 96–106)
Creatinine, Ser: 0.81 mg/dL (ref 0.57–1.00)
Globulin, Total: 3.4 g/dL (ref 1.5–4.5)
Glucose: 64 mg/dL — ABNORMAL LOW (ref 70–99)
Potassium: 4.2 mmol/L (ref 3.5–5.2)
Sodium: 138 mmol/L (ref 134–144)
Total Protein: 7.5 g/dL (ref 6.0–8.5)
eGFR: 83 mL/min/{1.73_m2} (ref >59)

## 2022-07-22 LAB — LIPID PANEL
Chol/HDL Ratio: 2.8 ratio (ref 0.0–4.4)
Cholesterol, Total: 173 mg/dL (ref 100–199)
HDL: 62 mg/dL (ref >39)
LDL Chol Calc (NIH): 94 mg/dL (ref 0–99)
Triglycerides: 94 mg/dL (ref 0–149)
VLDL Cholesterol Cal: 17 mg/dL (ref 5–40)

## 2022-07-23 NOTE — Progress Notes (Unsigned)
PATIENT: Katelyn Howard DOB: [birth year]  REASON FOR VISIT: follow up HISTORY FROM: patient PRIMARY NEUROLOGIST: Dr. Vickey Huger  Chief Complaint  Patient presents with   Follow-up    Pt in 19 Pt here CPAP f/u Pt states no questions or concerns for this visit      HISTORY OF PRESENT ILLNESS: Today 07/24/22:  Katelyn Howard is a 61 y.o. female with a history of obstructive sleep apnea on CPAP.  Her download is below.  She reports that the CPAP is working well for her.  She does feel the mask leaking occasionally.  She does change out her supplies regularly.  She returns today for an evaluation.       07/24/21: [name] is a 61 year old female with a history of obstructive sleep apnea on CPAP.  She returns today for follow-up.  She reports that the CPAP is working well for her.  She denies any new issues.  She does report that she needs new straps.  She states that she misplaced hers and is currently using a string to hold her CPAP mask on.    HISTORY: (Copied from Dr.Dohmeier's note) [name] had was not eligible yet for a new machine but her machine could be reset to her needs.  She is a CPAP dependent patient who was retested in a sleep study on 03/1619 21 and diagnosed with severe obstructive sleep apnea with an AHI of 44/h, CPAP was titrated but central apneas emerged and REM sleep did rebound.  She did best at a CPAP of 14 cm water pressure and she used a full facemask in the night but hypoxemia was still seen during the REM sleep there..  A Simplus facemask had been given to her and medium size during the sleep study.  The patient now shows me her compliance data and since mid November she has been able to use the machine much more reliably.  She still has sometimes trouble to get more than 2-1/2 hours on her machine so the average user time is only 2 hours and 41 minutes.  She has been using the machine for the last 6 days over 4 hours at night CPAP is now set to 11  cmH2O was 2 cm EPR and her residual AHI was 1.7/h.  Air leaks are moderate at 14 L/min.  She feels that this is a comfortable setting.  She endorsed the Epworth sleepiness score at 8 points out of 24 possible points and the fatigue severity score score is still high at 53 points. She still reports to snore some times, she wakes up without headaches.  Nocturia after 2-3 hours of sleep- and sometimes not putting the machine back on.   REVIEW OF SYSTEMS: Out of a complete 14 system review of symptoms, the patient complains only of the following symptoms, and all other reviewed systems are negative.   ESS 6  ALLERGIES: Allergies  Allergen Reactions   Shrimp [Shellfish Allergy] Anaphylaxis   Tetracycline Nausea And Vomiting    Other reaction(s): Other (See Comments)   Metformin     Other reaction(s): Unknown   Penicillin G     Other reaction(s): Unknown   Penicillins Itching, Swelling and Other (See Comments)    Has patient had a PCN reaction causing immediate rash, facial/tongue/throat swelling, SOB or lightheadedness with hypotension: Unknown Has patient had a PCN reaction causing severe rash involving mucus membranes or skin necrosis: Unknown Has patient had a PCN reaction that required hospitalization: Unknown Has  patient had a PCN reaction occurring within the last 10 years: No If all of the above answers are "NO", then may proceed with Cephalosporin use.     HOME MEDICATIONS: Outpatient Medications Prior to Visit  Medication Sig Dispense Refill   albuterol (PROVENTIL HFA;VENTOLIN HFA) 108 (90 BASE) MCG/ACT inhaler Inhale 2 puffs into the lungs every 6 (six) hours as needed. For wheezing     amLODipine (NORVASC) 10 MG tablet Take 1 tablet (10 mg total) by mouth daily. 90 tablet 3   aspirin EC 81 MG tablet Take 81 mg by mouth daily.     atorvastatin (LIPITOR) 80 MG tablet Take 80 mg by mouth every morning.      Cholecalciferol 50000 units capsule Take 50,000 Units by mouth every  Thursday.      cloNIDine (CATAPRES) 0.1 MG tablet Take 1 tablet (0.1 mg total) by mouth 2 (two) times daily. 60 tablet 11   clotrimazole (LOTRIMIN) 1 % cream Apply 1 application topically daily as needed (for rash).     esomeprazole (NEXIUM) 40 MG capsule Take 40 mg by mouth 2 (two) times daily before a meal.      fluticasone (CUTIVATE) 0.05 % cream Apply topically 2 (two) times daily. 30 g 1   fluticasone (FLONASE) 50 MCG/ACT nasal spray Place 1 spray into both nostrils 2 (two) times daily.     folic acid (FOLVITE) 400 MCG tablet Take 400 mcg by mouth daily.     furosemide (LASIX) 40 MG tablet Take 1 tablet (40 mg total) by mouth 2 (two) times daily. 180 tablet 0   glimepiride (AMARYL) 4 MG tablet Take 4 mg by mouth daily with breakfast.      hydrOXYzine (ATARAX/VISTARIL) 25 MG tablet Take 1 tablet (25 mg total) by mouth every 6 (six) hours. Prn itching 20 tablet 1   insulin degludec (TRESIBA FLEXTOUCH) 200 UNIT/ML FlexTouch Pen INJECT 80 UNITS SUBCUTANEOUSLY IN THE MORNING AND 80 IN THE EVENING INCREASE AS DIRECTED PER CLINIC     methocarbamol (ROBAXIN) 500 MG tablet Take 1 tablet by mouth daily.     MOUNJARO 7.5 MG/0.5ML Pen Inject 2 mLs into the skin once a week.     mupirocin ointment (BACTROBAN) 2 % Apply 1 application. topically 2 (two) times daily as needed.     nebivolol (BYSTOLIC) 10 MG tablet Take 10 mg by mouth daily.     nystatin cream (MYCOSTATIN) Apply 1 application. topically 2 (two) times daily as needed.     oxyCODONE-acetaminophen (PERCOCET/ROXICET) 5-325 MG tablet Take 1 tablet by mouth every 4 (four) hours as needed for severe pain.     potassium chloride (K-DUR) 10 MEQ tablet Take 10 mEq by mouth daily.      triamcinolone cream (KENALOG) 0.1 % Apply 1 application. topically 2 (two) times daily as needed.     valsartan (DIOVAN) 320 MG tablet Take 1 tablet by mouth daily.     No facility-administered medications prior to visit.    PAST MEDICAL HISTORY: Past Medical  History:  Diagnosis Date   Allergy    Arthritis    Asthma    CHF (congestive heart failure) (HCC) 2008   Diabetes mellitus    Edema extremities    GERD (gastroesophageal reflux disease)    Heart failure, type unknown (HCC) 09/05/2021   Hyperlipidemia    Hypertension    Neuromuscular disorder (HCC)    neuropathy   Obesity    OSA (obstructive sleep apnea) 10/13/2016   Severe with AHI  44.9/hr now on CPAP at 11cm H2O   Sleep apnea    wears cpap     PAST SURGICAL HISTORY: Past Surgical History:  Procedure Laterality Date   ABDOMINAL HYSTERECTOMY     EYE SURGERY      FAMILY HISTORY: Family History  Problem Relation Age of Onset   Stroke Mother    Heart failure Father    Heart disease Father    Hypertension Father    Arthritis Father    Gout Father    Hypertension Sister    Diabetes Sister    Heart attack Sister    Diabetes Brother    Stroke Brother    Hypertension Brother    Colon cancer Neg Hx    Colon polyps Neg Hx    Rectal cancer Neg Hx    Stomach cancer Neg Hx     SOCIAL HISTORY: Social History   Socioeconomic History   Marital status: Married    Spouse name: Not on file   Number of children: Not on file   Years of education: Not on file   Highest education level: Not on file  Occupational History   Not on file  Tobacco Use   Smoking status: Never   Smokeless tobacco: Never  Substance and Sexual Activity   Alcohol use: No   Drug use: No   Sexual activity: Yes    Birth control/protection: Surgical  Other Topics Concern   Not on file  Social History Narrative   Not on file   Social Determinants of Health   Financial Resource Strain: Not on file  Food Insecurity: Not on file  Transportation Needs: Not on file  Physical Activity: Not on file  Stress: Not on file  Social Connections: Not on file  Intimate Partner Violence: Not on file      PHYSICAL EXAM  Vitals:   07/24/22 1047  BP: (!) 166/78  Pulse: 70  Weight: 220 lb (99.8 kg)   Height: 5\' 3"  (1.6 m)   Body mass index is 38.97 kg/m.  Generalized: Well developed, in no acute distress  Chest: Lungs clear to auscultation bilaterally  Neurological examination  Mentation: Alert oriented to time, place, history taking. Follows all commands speech and language fluent Cranial nerve II-XII: Extraocular movements were full, visual field were full on confrontational test Head turning and shoulder shrug  were normal and symmetric. Gait and station: Patient is in a wheelchair.  Uses a cane to ambulate.   DIAGNOSTIC DATA (LABS, IMAGING, TESTING) - I reviewed patient records, labs, notes, testing and imaging myself where available.  Lab Results  Component Value Date   WBC 9.8 03/20/2020   HGB 12.5 03/20/2020   HCT 39.9 03/20/2020   MCV 81 03/20/2020   PLT 277 03/20/2020      Component Value Date/Time   NA 138 07/21/2022 1006   K 4.2 07/21/2022 1006   CL 101 07/21/2022 1006   CO2 24 07/21/2022 1006   GLUCOSE 64 (L) 07/21/2022 1006   GLUCOSE 72 11/29/2019 1700   BUN 9 07/21/2022 1006   CREATININE 0.81 07/21/2022 1006   CALCIUM 9.5 07/21/2022 1006   PROT 7.5 07/21/2022 1006   ALBUMIN 4.1 07/21/2022 1006   AST 23 07/21/2022 1006   ALT 25 07/21/2022 1006   ALKPHOS 143 (H) 07/21/2022 1006   BILITOT 0.3 07/21/2022 1006   GFRNONAA 80 03/20/2020 1923   GFRAA 92 03/20/2020 1923   Lab Results  Component Value Date   CHOL 173 07/21/2022  HDL 62 07/21/2022   LDLCALC 94 07/21/2022   TRIG 94 07/21/2022   CHOLHDL 2.8 07/21/2022   Lab Results  Component Value Date   HGBA1C (H) 05/24/2009    9.8 (NOTE) The ADA recommends the following therapeutic goal for glycemic control related to Hgb A1c measurement: Goal of therapy: <6.5 Hgb A1c  Reference: American Diabetes Association: Clinical Practice Recommendations 2010, Diabetes Care, 2010, 33: (Suppl  1).   No results found for: "VITAMINB12" Lab Results  Component Value Date   TSH 3.354 11/29/2019       ASSESSMENT AND PLAN 61 y.o. year old female  has a past medical history of Allergy, Arthritis, Asthma, CHF (congestive heart failure) (HCC) (2008), Diabetes mellitus, Edema extremities, GERD (gastroesophageal reflux disease), Heart failure, type unknown (HCC) (09/05/2021), Hyperlipidemia, Hypertension, Neuromuscular disorder (HCC), Obesity, OSA (obstructive sleep apnea) (10/13/2016), and Sleep apnea. here with:  OSA on CPAP  - CPAP compliance excellent -Residual AHI good -High leak order sent for mask refitting - Encourage patient to use CPAP nightly and > 4 hours each night - F/U in 1 year or sooner if needed   Butch Penny, MSN, NP-C 07/24/2022, 11:05 AM [facility] [address], [address] [place], [place] [address] [phone]

## 2022-07-24 ENCOUNTER — Ambulatory Visit (INDEPENDENT_AMBULATORY_CARE_PROVIDER_SITE_OTHER): Payer: Medicare HMO | Admitting: Adult Health

## 2022-07-24 VITALS — BP 166/78 | HR 70 | Ht 63.0 in | Wt 220.0 lb

## 2022-07-24 DIAGNOSIS — G4733 Obstructive sleep apnea (adult) (pediatric): Secondary | ICD-10-CM | POA: Diagnosis not present

## 2022-07-24 NOTE — Patient Instructions (Signed)
Continue using CPAP nightly and greater than 4 hours each night °If your symptoms worsen or you develop new symptoms please let us know.  ° °

## 2022-07-28 ENCOUNTER — Emergency Department (HOSPITAL_BASED_OUTPATIENT_CLINIC_OR_DEPARTMENT_OTHER): Payer: Medicare HMO | Admitting: Radiology

## 2022-07-28 ENCOUNTER — Other Ambulatory Visit: Payer: Self-pay

## 2022-07-28 ENCOUNTER — Emergency Department (HOSPITAL_BASED_OUTPATIENT_CLINIC_OR_DEPARTMENT_OTHER)
Admission: EM | Admit: 2022-07-28 | Discharge: 2022-07-28 | Disposition: A | Discharge status: Home / Self Care | Payer: Medicare HMO | Attending: Emergency Medicine | Admitting: Emergency Medicine

## 2022-07-28 ENCOUNTER — Encounter (HOSPITAL_BASED_OUTPATIENT_CLINIC_OR_DEPARTMENT_OTHER): Payer: Self-pay | Admitting: Emergency Medicine

## 2022-07-28 DIAGNOSIS — Z7982 Long term (current) use of aspirin: Secondary | ICD-10-CM | POA: Diagnosis not present

## 2022-07-28 DIAGNOSIS — S93401A Sprain of unspecified ligament of right ankle, initial encounter: Secondary | ICD-10-CM | POA: Diagnosis not present

## 2022-07-28 DIAGNOSIS — Z794 Long term (current) use of insulin: Secondary | ICD-10-CM | POA: Diagnosis not present

## 2022-07-28 DIAGNOSIS — Z79899 Other long term (current) drug therapy: Secondary | ICD-10-CM | POA: Diagnosis not present

## 2022-07-28 DIAGNOSIS — I509 Heart failure, unspecified: Secondary | ICD-10-CM | POA: Insufficient documentation

## 2022-07-28 DIAGNOSIS — M7662 Achilles tendinitis, left leg: Secondary | ICD-10-CM | POA: Diagnosis not present

## 2022-07-28 DIAGNOSIS — M25562 Pain in left knee: Secondary | ICD-10-CM | POA: Insufficient documentation

## 2022-07-28 DIAGNOSIS — S93402A Sprain of unspecified ligament of left ankle, initial encounter: Secondary | ICD-10-CM | POA: Diagnosis not present

## 2022-07-28 DIAGNOSIS — I110 Hypertensive heart disease with heart failure: Secondary | ICD-10-CM | POA: Diagnosis not present

## 2022-07-28 DIAGNOSIS — M79672 Pain in left foot: Secondary | ICD-10-CM | POA: Insufficient documentation

## 2022-07-28 DIAGNOSIS — Z7984 Long term (current) use of oral hypoglycemic drugs: Secondary | ICD-10-CM | POA: Insufficient documentation

## 2022-07-28 DIAGNOSIS — E119 Type 2 diabetes mellitus without complications: Secondary | ICD-10-CM | POA: Diagnosis not present

## 2022-07-28 NOTE — ED Provider Notes (Signed)
[facility] EMERGENCY DEPT Provider Note   CSN: [id number] Arrival date & time: 07/28/22  1544     History  Chief Complaint  Patient presents with   Ankle Pain    Katelyn Howard is a 61 y.o. female with medical history of heart failure, OSA, CHF, arthritis, diabetes, GERD, hypertension.  Patient presents to ED for evaluation of left-sided ankle, foot and knee pain.  Patient reports that on Friday she developed right-sided foot pain after twisting her ankle.  The patient reports that she then stepped down off of a curb today and felt a twist in her left knee however did not fall, did not hit her head.  Patient now complaining of left-sided knee pain, left-sided foot pain.  Patient reports that it is hard to ambulate.  Patient denies any numbness or tingling into the foot or knee.   Ankle Pain      Home Medications Prior to Admission medications   Medication Sig Start Date End Date Taking? Authorizing Provider  albuterol (PROVENTIL HFA;VENTOLIN HFA) 108 (90 BASE) MCG/ACT inhaler Inhale 2 puffs into the lungs every 6 (six) hours as needed. For wheezing    [provider]  amLODipine (NORVASC) 10 MG tablet Take 1 tablet (10 mg total) by mouth daily. 11/12/21 02/10/22  Skeet Latch, MD  aspirin EC 81 MG tablet Take 81 mg by mouth daily.    [provider]  atorvastatin (LIPITOR) 80 MG tablet Take 80 mg by mouth every morning.  04/25/15   [provider]  Cholecalciferol 50000 units capsule Take 50,000 Units by mouth every Thursday.     [provider]  cloNIDine (CATAPRES) 0.1 MG tablet Take 1 tablet (0.1 mg total) by mouth 2 (two) times daily. 03/08/16   Elnora Morrison, MD  clotrimazole (LOTRIMIN) 1 % cream Apply 1 application topically daily as needed (for rash).    [provider]  esomeprazole (NEXIUM) 40 MG capsule Take 40 mg by mouth 2 (two) times daily before a meal.  04/25/15   [provider]  fluticasone  (CUTIVATE) 0.05 % cream Apply topically 2 (two) times daily. 04/23/15   Billy Fischer, MD  fluticasone (FLONASE) 50 MCG/ACT nasal spray Place 1 spray into both nostrils 2 (two) times daily. 05/13/16   [provider]  folic acid (FOLVITE) A999333 MCG tablet Take 400 mcg by mouth daily.    [provider]  furosemide (LASIX) 40 MG tablet Take 1 tablet (40 mg total) by mouth 2 (two) times daily. 05/20/22   Skeet Latch, MD  glimepiride (AMARYL) 4 MG tablet Take 4 mg by mouth daily with breakfast.  04/25/15   [provider]  hydrOXYzine (ATARAX/VISTARIL) 25 MG tablet Take 1 tablet (25 mg total) by mouth every 6 (six) hours. Prn itching 04/23/15   Billy Fischer, MD  insulin degludec (TRESIBA FLEXTOUCH) 200 UNIT/ML FlexTouch Pen INJECT 80 UNITS SUBCUTANEOUSLY IN THE MORNING AND 80 IN THE EVENING INCREASE AS DIRECTED PER CLINIC 05/12/19   [provider]  methocarbamol (ROBAXIN) 500 MG tablet Take 1 tablet by mouth daily. 05/21/16   [provider]  MOUNJARO 7.5 MG/0.5ML Pen Inject 2 mLs into the skin once a week. 12/04/21   [provider]  mupirocin ointment (BACTROBAN) 2 % Apply 1 application. topically 2 (two) times daily as needed. 09/09/21   [provider]  nebivolol (BYSTOLIC) 10 MG tablet Take 10 mg by mouth daily.    [provider]  nystatin cream (MYCOSTATIN)  Apply 1 application. topically 2 (two) times daily as needed. 10/28/21   [provider]  oxyCODONE-acetaminophen (PERCOCET/ROXICET) 5-325 MG tablet Take 1 tablet by mouth every 4 (four) hours as needed for severe pain.    [provider]  potassium chloride (K-DUR) 10 MEQ tablet Take 10 mEq by mouth daily.     [provider]  triamcinolone cream (KENALOG) 0.1 % Apply 1 application. topically 2 (two) times daily as needed. 10/28/21   [provider]  valsartan (DIOVAN) 320 MG tablet Take 1 tablet by mouth daily. 12/07/21   [provider]      Allergies    Shrimp [shellfish allergy], Tetracycline, Metformin, Penicillin g, and Penicillins    Review of Systems   Review of Systems  Musculoskeletal:  Positive for arthralgias.  All other systems reviewed and are negative.   Physical Exam Updated Vital Signs BP (!) 171/75   Pulse 79   Temp 98.2 F (36.8 C)   Resp 18   SpO2 98%  Physical Exam Vitals and nursing note reviewed.  Constitutional:      General: She is not in acute distress.    Appearance: Normal appearance. She is not ill-appearing, toxic-appearing or diaphoretic.  HENT:     Head: Normocephalic and atraumatic.     Nose: Nose normal. No congestion.     Mouth/Throat:     Mouth: Mucous membranes are moist.     Pharynx: Oropharynx is clear.  Eyes:     Extraocular Movements: Extraocular movements intact.     Conjunctiva/sclera: Conjunctivae normal.     Pupils: Pupils are equal, round, and reactive to light.  Cardiovascular:     Rate and Rhythm: Normal rate and regular rhythm.  Pulmonary:     Effort: Pulmonary effort is normal.     Breath sounds: Normal breath sounds. No wheezing.  Abdominal:     General: Abdomen is flat. Bowel sounds are normal.     Palpations: Abdomen is soft.     Tenderness: There is no abdominal tenderness.  Musculoskeletal:     Cervical back: Normal range of motion and neck supple. No tenderness.     Left knee: Normal. No swelling, deformity or effusion.     Left ankle: Normal.     Left foot: Normal.     Comments: No obvious deformity to patient left knee, ankle, foot.  The patient has 2+ DP pulse into the foot.  Patient has appropriate range of motion maintained.  The patient has slight decreased range of motion of left knee secondary to pain.  Skin:    General: Skin is warm and dry.     Capillary Refill: Capillary refill takes less than 2 seconds.  Neurological:     Mental Status: She is alert and oriented to person, place, and time.     ED Results / Procedures /  Treatments   Labs (all labs ordered are listed, but only abnormal results are displayed) Labs Reviewed - No data to display  EKG None  Radiology DG Knee Complete 4 Views Left  Result Date: 07/28/2022 CLINICAL DATA:  Fall, knee pain and swelling EXAM: LEFT KNEE - COMPLETE 4+ VIEW COMPARISON:  07/29/2006 FINDINGS: Severe osteoarthritis with tricompartmental spurring and substantial loss of articular space in the medial compartment and patellofemoral joint, moderate loss of articular space in the lateral compartment. Spurring of the tibial spine noted. Probable knee effusion although not certain based on obliquity of the lateral projection. IMPRESSION: 1. Severe osteoarthritis. 2. Probable  knee effusion. 3. No fracture identified. Electronically Signed   By: Gaylyn Rong M.D.   On: 07/28/2022 17:41   DG Foot Complete Left  Result Date: 07/28/2022 CLINICAL DATA:  Fall twisting ankle injury EXAM: LEFT FOOT - COMPLETE 3+ VIEW COMPARISON:  None Available. FINDINGS: Frontal projection suboptimal due to twisting or obliquity of the foot. This reduces diagnostic sensitivity and specificity. I do not see a discrete fracture. Mild chronic appearing periosteal irregularity along the medial distal third metatarsal metaphysis. Substantial degenerative midfoot spurring. Plantar and Achilles calcaneal spurs with ossifications in the plantar fascia and linear calcification in the distal Achilles tendon. Substantial calf edema tracking into the ankle. No malalignment at the Lisfranc joint. IMPRESSION: 1. No fracture identified. 2. Substantial calf edema tracking into the ankle. 3. Plantar and Achilles calcaneal spurs with ossifications in the plantar fascia and linear calcification in the distal Achilles tendon. 4. Substantial degenerative midfoot spurring. 5. If pain persists despite conservative therapy, MRI may be warranted for further characterization. Electronically Signed   By: Gaylyn Rong M.D.    On: 07/28/2022 17:34    Procedures Procedures   Medications Ordered in ED Medications - No data to display  ED Course/ Medical Decision Making/ A&P                           Medical Decision Making Amount and/or Complexity of Data Reviewed Radiology: ordered.   61 year old female presents to the ED for evaluation.  Please see HPI for further details.  On examination patient afebrile and nontachycardic.  The patient left knee has no obvious deformity, her left foot has no obvious deformity.  Range of motion is maintained however there is slightly decreased range of motion of the patient left knee secondary to pain.  No overlying skin change.  Patient imaging here are reassuring, no obvious fractures or deformities noted.  Patient states that it is hard to walk and bear weight on the foot.  Will place the patient in a cam boot and have her follow-up with sports medicine.  The patient was given return precautions and she voiced understanding.  The patient had all of her questions answered to her satisfaction.  The patient is stable this time for discharge home.  Final Clinical Impression(s) / ED Diagnoses Final diagnoses:  Left foot pain    Rx / DC Orders ED Discharge Orders     None         Al Decant, PA-C 07/28/22 2317    Glyn Ade, MD 07/29/22 [phone]

## 2022-07-28 NOTE — ED Triage Notes (Signed)
Pt  states it wasn't her left ankle but her left foot

## 2022-07-28 NOTE — Discharge Instructions (Addendum)
Return to the ED with any new or worsening signs or symptoms Please follow-up with sports medicine.  Please call and make an appointment to be seen. Please utilize the RICE method.  Please rest, ice, compress and elevate your left foot and ankle Please continue taking Tylenol for pain control Please remain in the cam boot while walking until seen by sports medicine

## 2022-07-28 NOTE — ED Notes (Signed)
Patient family member refusing to allow this RN to place patient on monitoring devices.States xrays have already been shot and that they just want the results and to leave. Educated patient and visitor the importance of monitoring devices.

## 2022-07-28 NOTE — ED Notes (Signed)
Patient family member states they are ready to leave prior to placing CAM boot. Educated family member that boot should be placed prior to leaving the facility to prevent further damage.

## 2022-07-28 NOTE — ED Triage Notes (Signed)
Pt twisted her left ankle Friday and has been hurting and swollen, today when she stepped wrong her left knee twisted and is painful.

## 2022-07-31 NOTE — Progress Notes (Signed)
New, Doristine Mango, RN; Milon Dikes; Marveen Reeks Received, Thank you!     Previous Messages    ----- Message ----- From: Guy Begin, RN Sent: 07/30/2022   4:06 PM EST To: Henderson Newcomer; Kathyrn Sheriff; Santina Evans; * Subject: mask refitting                                New order in EPIC for mask refitting  Katelyn Howard Female, 61 y.o., 1961-01-19 MRN: [id number]   Thank you,  Andrey Campanile RN

## 2022-08-19 ENCOUNTER — Other Ambulatory Visit (HOSPITAL_BASED_OUTPATIENT_CLINIC_OR_DEPARTMENT_OTHER): Payer: Self-pay | Admitting: Cardiovascular Disease

## 2022-08-19 NOTE — Telephone Encounter (Signed)
Rx request sent to pharmacy.  

## 2022-09-08 DIAGNOSIS — R748 Abnormal levels of other serum enzymes: Secondary | ICD-10-CM | POA: Diagnosis not present

## 2022-09-08 DIAGNOSIS — M791 Myalgia, unspecified site: Secondary | ICD-10-CM | POA: Diagnosis not present

## 2022-09-08 DIAGNOSIS — I5032 Chronic diastolic (congestive) heart failure: Secondary | ICD-10-CM | POA: Diagnosis not present

## 2022-09-08 DIAGNOSIS — Z79899 Other long term (current) drug therapy: Secondary | ICD-10-CM | POA: Diagnosis not present

## 2022-09-08 DIAGNOSIS — M0579 Rheumatoid arthritis with rheumatoid factor of multiple sites without organ or systems involvement: Secondary | ICD-10-CM | POA: Diagnosis not present

## 2022-09-08 DIAGNOSIS — M1990 Unspecified osteoarthritis, unspecified site: Secondary | ICD-10-CM | POA: Diagnosis not present

## 2022-09-08 DIAGNOSIS — R600 Localized edema: Secondary | ICD-10-CM | POA: Diagnosis not present

## 2022-09-08 DIAGNOSIS — I878 Other specified disorders of veins: Secondary | ICD-10-CM | POA: Diagnosis not present

## 2022-09-08 DIAGNOSIS — R700 Elevated erythrocyte sedimentation rate: Secondary | ICD-10-CM | POA: Diagnosis not present

## 2022-09-23 DIAGNOSIS — H5203 Hypermetropia, bilateral: Secondary | ICD-10-CM | POA: Diagnosis not present

## 2022-09-23 DIAGNOSIS — H524 Presbyopia: Secondary | ICD-10-CM | POA: Diagnosis not present

## 2022-11-10 ENCOUNTER — Other Ambulatory Visit (HOSPITAL_BASED_OUTPATIENT_CLINIC_OR_DEPARTMENT_OTHER): Payer: Self-pay | Admitting: Cardiovascular Disease

## 2022-11-10 NOTE — Telephone Encounter (Signed)
Rx request sent to pharmacy.  

## 2022-12-05 DIAGNOSIS — E1169 Type 2 diabetes mellitus with other specified complication: Secondary | ICD-10-CM | POA: Diagnosis not present

## 2022-12-05 DIAGNOSIS — I110 Hypertensive heart disease with heart failure: Secondary | ICD-10-CM | POA: Diagnosis not present

## 2022-12-05 DIAGNOSIS — K219 Gastro-esophageal reflux disease without esophagitis: Secondary | ICD-10-CM | POA: Diagnosis not present

## 2022-12-05 DIAGNOSIS — E785 Hyperlipidemia, unspecified: Secondary | ICD-10-CM | POA: Diagnosis not present

## 2022-12-11 DIAGNOSIS — I509 Heart failure, unspecified: Secondary | ICD-10-CM | POA: Diagnosis not present

## 2022-12-11 DIAGNOSIS — E11621 Type 2 diabetes mellitus with foot ulcer: Secondary | ICD-10-CM | POA: Diagnosis not present

## 2022-12-11 DIAGNOSIS — I110 Hypertensive heart disease with heart failure: Secondary | ICD-10-CM | POA: Diagnosis not present

## 2022-12-11 DIAGNOSIS — S81801A Unspecified open wound, right lower leg, initial encounter: Secondary | ICD-10-CM | POA: Diagnosis not present

## 2022-12-11 DIAGNOSIS — I10 Essential (primary) hypertension: Secondary | ICD-10-CM | POA: Diagnosis not present

## 2022-12-11 DIAGNOSIS — Z794 Long term (current) use of insulin: Secondary | ICD-10-CM | POA: Diagnosis not present

## 2022-12-11 DIAGNOSIS — B372 Candidiasis of skin and nail: Secondary | ICD-10-CM | POA: Diagnosis not present

## 2022-12-11 DIAGNOSIS — J45909 Unspecified asthma, uncomplicated: Secondary | ICD-10-CM | POA: Diagnosis not present

## 2022-12-11 DIAGNOSIS — Z0000 Encounter for general adult medical examination without abnormal findings: Secondary | ICD-10-CM | POA: Diagnosis not present

## 2022-12-11 DIAGNOSIS — R82998 Other abnormal findings in urine: Secondary | ICD-10-CM | POA: Diagnosis not present

## 2022-12-11 DIAGNOSIS — E1169 Type 2 diabetes mellitus with other specified complication: Secondary | ICD-10-CM | POA: Diagnosis not present

## 2022-12-15 DIAGNOSIS — I87331 Chronic venous hypertension (idiopathic) with ulcer and inflammation of right lower extremity: Secondary | ICD-10-CM | POA: Diagnosis not present

## 2022-12-15 DIAGNOSIS — I890 Lymphedema, not elsewhere classified: Secondary | ICD-10-CM | POA: Diagnosis not present

## 2022-12-15 DIAGNOSIS — I110 Hypertensive heart disease with heart failure: Secondary | ICD-10-CM | POA: Diagnosis not present

## 2022-12-15 DIAGNOSIS — J45909 Unspecified asthma, uncomplicated: Secondary | ICD-10-CM | POA: Diagnosis not present

## 2022-12-15 DIAGNOSIS — G4733 Obstructive sleep apnea (adult) (pediatric): Secondary | ICD-10-CM | POA: Diagnosis not present

## 2022-12-16 DIAGNOSIS — J42 Unspecified chronic bronchitis: Secondary | ICD-10-CM | POA: Diagnosis not present

## 2022-12-16 DIAGNOSIS — J449 Chronic obstructive pulmonary disease, unspecified: Secondary | ICD-10-CM | POA: Diagnosis not present

## 2022-12-18 DIAGNOSIS — I110 Hypertensive heart disease with heart failure: Secondary | ICD-10-CM | POA: Diagnosis not present

## 2022-12-18 DIAGNOSIS — G4733 Obstructive sleep apnea (adult) (pediatric): Secondary | ICD-10-CM | POA: Diagnosis not present

## 2022-12-18 DIAGNOSIS — J45909 Unspecified asthma, uncomplicated: Secondary | ICD-10-CM | POA: Diagnosis not present

## 2022-12-18 DIAGNOSIS — I890 Lymphedema, not elsewhere classified: Secondary | ICD-10-CM | POA: Diagnosis not present

## 2022-12-18 DIAGNOSIS — I87331 Chronic venous hypertension (idiopathic) with ulcer and inflammation of right lower extremity: Secondary | ICD-10-CM | POA: Diagnosis not present

## 2023-01-09 DIAGNOSIS — J449 Chronic obstructive pulmonary disease, unspecified: Secondary | ICD-10-CM | POA: Diagnosis not present

## 2023-01-09 DIAGNOSIS — J42 Unspecified chronic bronchitis: Secondary | ICD-10-CM | POA: Diagnosis not present

## 2023-01-20 ENCOUNTER — Telehealth (HOSPITAL_BASED_OUTPATIENT_CLINIC_OR_DEPARTMENT_OTHER): Payer: Self-pay | Admitting: Cardiovascular Disease

## 2023-01-20 ENCOUNTER — Encounter (HOSPITAL_BASED_OUTPATIENT_CLINIC_OR_DEPARTMENT_OTHER): Payer: Self-pay | Admitting: Cardiovascular Disease

## 2023-01-20 ENCOUNTER — Other Ambulatory Visit (HOSPITAL_BASED_OUTPATIENT_CLINIC_OR_DEPARTMENT_OTHER): Payer: Self-pay

## 2023-01-20 ENCOUNTER — Ambulatory Visit (INDEPENDENT_AMBULATORY_CARE_PROVIDER_SITE_OTHER): Payer: Medicare HMO | Admitting: Cardiovascular Disease

## 2023-01-20 VITALS — BP 128/68 | HR 71 | Ht 63.0 in | Wt 328.0 lb

## 2023-01-20 DIAGNOSIS — E669 Obesity, unspecified: Secondary | ICD-10-CM

## 2023-01-20 DIAGNOSIS — I890 Lymphedema, not elsewhere classified: Secondary | ICD-10-CM | POA: Insufficient documentation

## 2023-01-20 DIAGNOSIS — Z9989 Dependence on other enabling machines and devices: Secondary | ICD-10-CM | POA: Diagnosis not present

## 2023-01-20 DIAGNOSIS — G4733 Obstructive sleep apnea (adult) (pediatric): Secondary | ICD-10-CM

## 2023-01-20 DIAGNOSIS — E662 Morbid (severe) obesity with alveolar hypoventilation: Secondary | ICD-10-CM | POA: Diagnosis not present

## 2023-01-20 DIAGNOSIS — E7800 Pure hypercholesterolemia, unspecified: Secondary | ICD-10-CM | POA: Diagnosis not present

## 2023-01-20 DIAGNOSIS — I1A0 Resistant hypertension: Secondary | ICD-10-CM | POA: Diagnosis not present

## 2023-01-20 HISTORY — DX: Lymphedema, not elsewhere classified: I89.0

## 2023-01-20 MED ORDER — MOUNJARO 12.5 MG/0.5ML ~~LOC~~ SOAJ
12.5000 mg | SUBCUTANEOUS | 1 refills | Status: DC
Start: 2023-01-20 — End: 2023-11-23
  Filled 2023-01-20: qty 2, 28d supply, fill #0
  Filled 2023-02-18: qty 2, 28d supply, fill #1

## 2023-01-20 NOTE — Assessment & Plan Note (Addendum)
Continue atorvastatin.  Increase exercise to least 150 minutes weekly.  Check lipids annually.  Consider inclisiran if she remains above goal.

## 2023-01-20 NOTE — Assessment & Plan Note (Signed)
Blood pressure is much better controlled.  It was initially elevated but better on repeat.  Continue amlodipine, clonidine, nebivolol, and valsartan.  Continue working on diet and exercise.  Limit sodium intake.

## 2023-01-20 NOTE — Patient Instructions (Addendum)
Medication Instructions:  INCREASE MOUNJARO TO 12.5 MG WEEKLY   *If you need a refill on your cardiac medications before your next appointment, please call your pharmacy*  Lab Work: NONE  Testing/Procedures: NONE  Follow-Up: At [facility], you and your health needs are our priority.  As part of our continuing mission to provide you with exceptional heart care, we have created designated Provider Care Teams.  These Care Teams include your primary Cardiologist (physician) and Advanced Practice Providers (APPs -  Physician Assistants and Nurse Practitioners) who all work together to provide you with the care you need, when you need it.  We recommend signing up for the patient portal called "MyChart".  Sign up information is provided on this After Visit Summary.  MyChart is used to connect with patients for Virtual Visits (Telemedicine).  Patients are able to view lab/test results, encounter notes, upcoming appointments, etc.  Non-urgent messages can be sent to your provider as well.   To learn more about what you can do with MyChart, go to [email or web address].    Your next appointment:   4 month(s)  Provider:   Chilton Si, MD or Gillian Shields, NP    Other Instructions  You have been referred to [facility] If you do not hear from the office you can call them directly at number highlighted   You have been referred to [facility] [place]

## 2023-01-20 NOTE — Assessment & Plan Note (Signed)
Continue to encourage CPAP use. 

## 2023-01-20 NOTE — Progress Notes (Signed)
Cardiology Office Visit  Date:  01/20/2023   ID:  Katelyn Howard, DOB 1961/04/06, MRN [id number]  PCP:  Rodrigo Ran, MD  Cardiologist:   Chilton Si, MD  CC: Follow-up   History of Present Illness: Katelyn Howard is a 62 y.o. female with OSA on CPAP, asthma, morbid obesity and diabetes here for follow-up.  She was first seen in advanced hypertension clinic 08/2021.  She initially reported shortness of breath. She saw her PCP and increased lasix from 40 mg daily to 40 mg twice daily with some improvement.  She did note LE edema, orthopnea and PND.  Since starting lasix her breathing improved but was not back to baseline.  Her exercise was limited due to back pain.  She used a cane for walking.  She didn't limit sodium intake or follow a particular diet.    Her blood pressure was uncontrolled. HCTZ was stopped and lasix was increased to 40 mg twice daily. She was started on valsartan and referred to the PREP program.  She had not yet started Valsartan and her blood pressure remained above goal. She had renal dopplers that were normal. Echo 09/06/21 revealed LVF 60-6% with grade I diastolic dysfunction. She was enrolled in a patient monitoring study and reported diarrhea after starting Valsartan. She was switched back to losartan/HCTZ and amlodipine was increased to 10mg . Blood pressure was better controlled at follow-up.  She struggled with LE edema.  She saw a vascular doctor who recommended compression socks but she was unable to tolerate them because they are too tight on her ankles.  At her visit 07/2022 blood pressure was much better controlled and she was encouraged to work on increasing her exercise.  Today, she still has shortness of breath. She recently had her physical and was started on a new inhaler which has been helping. Also, she still struggles with significant LE edema that has been stable despite taking 3 tablets daily of her diuretic. Lately she hasn't been checking  blood pressures at home. In clinic today her BP is 134/77, and improved to 128/68 on recheck. She would attribute the higher reading to checking it right after walking into the exam room. For activity, she does complete sit-down exercises at home. Generally she feels well during exercise without anginal symptoms. She is tolerating Mounjaro. It is helping with her A1c but hasn't been helping her lose more weight. She has been on the 7.5 mg dose for about a year now. She denies any palpitations, chest pain, lightheadedness, headaches, syncope, orthopnea, or PND.  Prior antihypertensives:  Valsartan  Past Medical History:  Diagnosis Date   (HFpEF) heart failure with preserved ejection fraction (HCC) 09/05/2021   Allergy    Arthritis    Asthma    CHF (congestive heart failure) (HCC) 2008   Diabetes mellitus    Edema extremities    GERD (gastroesophageal reflux disease)    Heart failure, type unknown (HCC) 09/05/2021   Hyperlipidemia    Hypertension    Lymphedema 01/20/2023   Neuromuscular disorder (HCC)    neuropathy   Obesity    OSA (obstructive sleep apnea) 10/13/2016   Severe with AHI 44.9/hr now on CPAP at 11cm H2O   Sleep apnea    wears cpap     Past Surgical History:  Procedure Laterality Date   ABDOMINAL HYSTERECTOMY     EYE SURGERY       Current Outpatient Medications  Medication Sig Dispense Refill   albuterol (PROVENTIL HFA;VENTOLIN HFA) 108 (  90 BASE) MCG/ACT inhaler Inhale 2 puffs into the lungs every 6 (six) hours as needed. For wheezing     amLODipine (NORVASC) 10 MG tablet Take 1 tablet by mouth once daily 90 tablet 1   ARNUITY ELLIPTA 100 MCG/ACT AEPB Take 1 puff by mouth daily.     aspirin EC 81 MG tablet Take 81 mg by mouth daily.     atorvastatin (LIPITOR) 80 MG tablet Take 80 mg by mouth every morning.      Cholecalciferol 50000 units capsule Take 50,000 Units by mouth every Thursday.      cloNIDine (CATAPRES) 0.1 MG tablet Take 1 tablet (0.1 mg total) by  mouth 2 (two) times daily. 60 tablet 11   clotrimazole (LOTRIMIN) 1 % cream Apply 1 application topically daily as needed (for rash).     cyclobenzaprine (FLEXERIL) 10 MG tablet TAKE 1 TABLET BY MOUTH TWICE DAILY AS NEEDED FOR MUSCLE PAIN OR SPASMS     esomeprazole (NEXIUM) 40 MG capsule Take 40 mg by mouth 2 (two) times daily before a meal.      fluticasone (CUTIVATE) 0.05 % cream Apply topically 2 (two) times daily. 30 g 1   fluticasone (FLONASE) 50 MCG/ACT nasal spray Place 1 spray into both nostrils 2 (two) times daily.     folic acid (FOLVITE) 400 MCG tablet Take 400 mcg by mouth daily.     furosemide (LASIX) 40 MG tablet Take 1 tablet by mouth twice daily 180 tablet 1   glimepiride (AMARYL) 4 MG tablet Take 4 mg by mouth daily with breakfast.      hydrOXYzine (ATARAX/VISTARIL) 25 MG tablet Take 1 tablet (25 mg total) by mouth every 6 (six) hours. Prn itching 20 tablet 1   insulin degludec (TRESIBA FLEXTOUCH) 200 UNIT/ML FlexTouch Pen INJECT 80 UNITS SUBCUTANEOUSLY IN THE MORNING AND 80 IN THE EVENING INCREASE AS DIRECTED PER CLINIC     methocarbamol (ROBAXIN) 500 MG tablet Take 1 tablet by mouth daily.     mupirocin ointment (BACTROBAN) 2 % Apply 1 application. topically 2 (two) times daily as needed.     nebivolol (BYSTOLIC) 10 MG tablet Take 10 mg by mouth daily.     nystatin cream (MYCOSTATIN) Apply 1 application. topically 2 (two) times daily as needed.     oxyCODONE (OXY IR/ROXICODONE) 5 MG immediate release tablet Take 5 mg by mouth every 6 (six) hours as needed for moderate pain, severe pain or breakthrough pain.     oxyCODONE-acetaminophen (PERCOCET/ROXICET) 5-325 MG tablet Take 1 tablet by mouth every 4 (four) hours as needed for severe pain.     potassium chloride (K-DUR) 10 MEQ tablet Take 10 mEq by mouth daily.      tirzepatide (MOUNJARO) 12.5 MG/0.5ML Pen Inject 12.5 mg into the skin once a week. 6 mL 1   triamcinolone cream (KENALOG) 0.1 % Apply 1 application. topically 2 (two)  times daily as needed.     valsartan (DIOVAN) 320 MG tablet Take 1 tablet by mouth once daily 90 tablet 1   No current facility-administered medications for this visit.    Allergies:   Shrimp [shellfish allergy], Tetracycline, Metformin, Penicillin g, and Penicillins    Social History:  The patient  reports that she has never smoked. She has never used smokeless tobacco. She reports that she does not drink alcohol and does not use drugs.   Family History:  The patient's family history includes Arthritis in her father; Diabetes in her brother and sister; Gout in her  father; Heart attack in her sister; Heart disease in her father; Heart failure in her father; Hypertension in her brother, father, and sister; Stroke in her brother and mother.    ROS:   Please see the history of present illness.  (+) Shortness of breath (+) BLE edema All other systems are reviewed and negative.   PHYSICAL EXAM: VS:  BP 128/68 (BP Location: Left Arm, Patient Position: Sitting, Cuff Size: Large)   Pulse 71   Ht 5\' 3"  (1.6 m)   Wt (!) 328 lb (148.8 kg)   SpO2 99%   BMI 58.10 kg/m  , BMI Body mass index is 58.1 kg/m. GENERAL:  Well appearing HEENT:  Pupils equal round and reactive, fundi not visualized, oral mucosa unremarkable NECK:  No jugular venous distention, waveform within normal limits, carotid upstroke brisk and symmetric, no bruits, no thyromegaly LUNGS:  Clear to auscultation bilaterally HEART:  RRR.  PMI not displaced or sustained,S1 and S2 within normal limits, no S3, no S4, no clicks, no rubs, no murmurs ABD:  Flat, positive bowel sounds normal in frequency in pitch, no bruits, no rebound, no guarding, no midline pulsatile mass, no hepatomegaly, no splenomegaly EXT:  2 plus pulses throughout, R LE lymphedema, no cyanosis no clubbing SKIN:  No rashes no nodules NEURO:  Cranial nerves II through XII grossly intact, motor grossly intact throughout PSYCH:  Cognitively intact, oriented to person  place and time   Echo  09/12/2021: Sonographer Comments: Patient is morbidly obese. Image acquisition  challenging due to respiratory motion.   IMPRESSIONS   1. Left ventricular ejection fraction, by estimation, is 60 to 65%. The  left ventricle has normal function. The left ventricle has no regional  wall motion abnormalities. Left ventricular diastolic parameters are  consistent with Grade I diastolic  dysfunction (impaired relaxation).   2. Right ventricular systolic function is normal. The right ventricular  size is normal.   3. The mitral valve is normal in structure. No evidence of mitral valve  regurgitation. No evidence of mitral stenosis.   4. The aortic valve is tricuspid. Aortic valve regurgitation is not  visualized. No aortic stenosis is present.   5. The inferior vena cava is normal in size with greater than 50%  respiratory variability, suggesting right atrial pressure of 3 mmHg.   Comparison(s): Prior images unable to be directly viewed, comparison made  by report only. EF 55%.   Bilateral Renal Artery Dopplers  09/12/2021: Summary:  Largest Aortic Diameter: 2.0 cm    Renal:    Right: Normal size right kidney. Abnormal right Resistive Index.         Normal cortical thickness of right kidney. No evidence of         right renal artery stenosis. RRV flow present.  Left:  Normal size of left kidney. Abnormal left Resisitve Index.         Normal cortical thickness of the left kidney. No evidence of         left renal artery stenosis. LRV flow present.  Mesenteric:  Normal Celiac artery and Superior Mesenteric artery findings.    Of note, this exam was technically limited due to patient's inability to  hold breath.   Echo 06/23/2016 - Left ventricle: The cavity size was normal. Systolic function was    normal. The estimated ejection fraction was in the range of 55%    to 60%. Wall motion was normal; there were no regional wall    motion abnormalities. The  transmitral flow pattern was normal.    The pulmonary vein flow pattern was normal. Left ventricular    diastolic function parameters were mildly abnormal for age.    Indeterminate filling pressure by Doppler parameters.  - Atrial septum: No defect or patent foramen ovale was identified.   EKG:   EKG is personally reviewed. 01/20/2023:  Not ordered. 07/21/22: Sinus rhythm. Rate 65 bpm. First Degree AV Block 08/30/21: sinus rhythm.  Rate 68 bpm  Recent Labs: 07/21/2022: ALT 25; BUN 9; Creatinine, Ser 0.81; Potassium 4.2; Sodium 138   08/23/2021: Sodium 143, potassium 3.7, BUN 11, creatinine 0.7 WBC 9, hemoglobin 11.8, hematocrit 37.4, platelets 256  Lipid Panel    Component Value Date/Time   CHOL 173 07/21/2022 1006   TRIG 94 07/21/2022 1006   HDL 62 07/21/2022 1006   CHOLHDL 2.8 07/21/2022 1006   CHOLHDL 3.8 05/25/2009 0605   VLDL 20 05/25/2009 0605   LDLCALC 94 07/21/2022 1006      Wt Readings from Last 3 Encounters:  01/20/23 (!) 328 lb (148.8 kg)  07/24/22 220 lb (99.8 kg)  07/21/22 (!) 328 lb 6.4 oz (149 kg)      ASSESSMENT AND PLAN:  Resistant hypertension Blood pressure is much better controlled.  It was initially elevated but better on repeat.  Continue amlodipine, clonidine, nebivolol, and valsartan.  Continue working on diet and exercise.  Limit sodium intake.  OSA (obstructive sleep apnea) Continue to encourage CPAP use.  HYPERCHOLESTEROLEMIA, PURE Continue atorvastatin.  Increase exercise to least 150 minutes weekly.  Check lipids annually.  Consider inclisiran if she remains above goal.   Super obesity She has struggled with weight loss despite taking Mounjaro.  We will increase her dose to 12.5mg  weekly.  Could also consider switching to Zepbound.  We will [facility].  Encouraged her to increase exercise to at least 150 minutes weekly.  Lymphedema She has R LE lymphedema.  Unable to use compression sock at this time.   Recommend referral to lymphedema clinic for consideration of Unna boot and lymphedema pump.    Disposition:    FU with Besse Miron C. Duke Salvia, MD, [facility] in 4 months.  Medication Adjustments/Labs and Tests Ordered: Current medicines are reviewed at length with the patient today.  Concerns regarding medicines are outlined above.   Orders Placed This Encounter  Procedures   Ambulatory referral to [facility]   Meds ordered this encounter  Medications   tirzepatide (MOUNJARO) 12.5 MG/0.5ML Pen    Sig: Inject 12.5 mg into the skin once a week.    Dispense:  6 mL    Refill:  1   Patient Instructions  Medication Instructions:  INCREASE MOUNJARO TO 12.5 MG WEEKLY   *If you need a refill on your cardiac medications before your next appointment, please call your pharmacy*  Lab Work: NONE  Testing/Procedures: NONE  Follow-Up: At [facility], you and your health needs are our priority.  As part of our continuing mission to provide you with exceptional heart care, we have created designated Provider Care Teams.  These Care Teams include your primary Cardiologist (physician) and Advanced Practice Providers (APPs -  Physician Assistants and Nurse Practitioners) who all work together to provide you with the care you need, when you need it.  We recommend signing up for the patient portal called "MyChart".  Sign up information is provided on this After Visit Summary.  MyChart is used to connect with patients for Virtual  Visits (Telemedicine).  Patients are able to view lab/test results, encounter notes, upcoming appointments, etc.  Non-urgent messages can be sent to your provider as well.   To learn more about what you can do with MyChart, go to [email or web address].    Your next appointment:   4 month(s)  Provider:   Chilton Si, MD or Gillian Shields, NP    Other Instructions  You have been referred to [facility] If you do not hear from the office you  can call them directly at number highlighted   You have been referred to [facility] [place]      I,Mathew Stumpf,acting as a scribe for Chilton Si, MD.,have documented all relevant documentation on the behalf of Chilton Si, MD,as directed by  Chilton Si, MD while in the presence of Chilton Si, MD.  I, Jahniah Pallas C. Duke Salvia, MD have reviewed all documentation for this visit.  The documentation of the exam, diagnosis, procedures, and orders on 01/20/2023 are all accurate and complete.   Signed, Tajha Sammarco C. Duke Salvia, MD, [facility]  01/20/2023 10:12 AM    [facility]

## 2023-01-20 NOTE — Assessment & Plan Note (Signed)
She has R LE lymphedema.  Unable to use compression sock at this time.  Recommend referral to lymphedema clinic for consideration of Unna boot and lymphedema pump.

## 2023-01-20 NOTE — Telephone Encounter (Signed)
Records faxed to [facility] Lymphedema clinic --[address] [place], [place] [address]  phone [phone] fax [phone]

## 2023-01-20 NOTE — Assessment & Plan Note (Signed)
She has struggled with weight loss despite taking Mounjaro.  We will increase her dose to 12.5mg  weekly.  Could also consider switching to Zepbound.  We will refer her to [facility].  Encouraged her to increase exercise to at least 150 minutes weekly.

## 2023-01-20 NOTE — Progress Notes (Deleted)
Cardiology Office Visit   Date:  01/20/2023   ID:  Katelyn Howard, DOB 1961-02-18, MRN [id number]  PCP:  Rodrigo Ran, MD  Cardiologist:   Chilton Si, MD  No chief complaint on file.    History of Present Illness: Katelyn Howard is a 62 y.o. female with OSA on CPAP, asthma, morbid obesity and diabetes here for follow-up.  She was first seen in advanced hypertension clinic 08/2021.  She initially reported shortness of breath. She saw her PCP and increased lasix from 40 mg daily to 40 mg twice daily with some improvement.  She did note LE edema, orthopnea and PND.  Since starting lasix her breathing improved but is not back at baseline.  She doesn't have a scale but notes that her clothes are tighter.  She denies chest pain, though she notes that she can't exercise much due to back pain.  She uses a cane for walking.  She doesn't limit sodium intake or follow a particular diet.    Her blood pressure was uncontrolled. HCTZ was stopped and lasix was increased to 40 mg twice daily. She was started on valsartan and referred to the PREP program.  At the last visit, she had not yet started Valsartan and her blood pressure remained above goal. She had renal dopplers that were normal. Echo 09/06/21 revealed LVF 60-6% with grade I diastolic dysfunction. She was enrolled in a patient monitoring study and reported diarrhea after starting Valsartan. She was switched back to losartan/HCTZ and amlodipine was increased to 10mg . Blood pressure was better controlled at follow-up.  She struggled with LE edema.  She saw a vascular doctor who recommended compression socks but she is unable to tolerate them because they are too tight on her ankles.  At her visit 07/2022 blood pressure was much better controlled and she was encouraged to work on increasing her exercise.  ?inclisiran  Prior antihypertensives:  Valsartan  Past Medical History:  Diagnosis Date   Allergy    Arthritis    Asthma     CHF (congestive heart failure) (HCC) 2008   Diabetes mellitus    Edema extremities    GERD (gastroesophageal reflux disease)    Heart failure, type unknown (HCC) 09/05/2021   Hyperlipidemia    Hypertension    Neuromuscular disorder (HCC)    neuropathy   Obesity    OSA (obstructive sleep apnea) 10/13/2016   Severe with AHI 44.9/hr now on CPAP at 11cm H2O   Sleep apnea    wears cpap     Past Surgical History:  Procedure Laterality Date   ABDOMINAL HYSTERECTOMY     EYE SURGERY       Current Outpatient Medications  Medication Sig Dispense Refill   albuterol (PROVENTIL HFA;VENTOLIN HFA) 108 (90 BASE) MCG/ACT inhaler Inhale 2 puffs into the lungs every 6 (six) hours as needed. For wheezing     amLODipine (NORVASC) 10 MG tablet Take 1 tablet by mouth once daily 90 tablet 1   aspirin EC 81 MG tablet Take 81 mg by mouth daily.     atorvastatin (LIPITOR) 80 MG tablet Take 80 mg by mouth every morning.      Cholecalciferol 50000 units capsule Take 50,000 Units by mouth every Thursday.      cloNIDine (CATAPRES) 0.1 MG tablet Take 1 tablet (0.1 mg total) by mouth 2 (two) times daily. 60 tablet 11   clotrimazole (LOTRIMIN) 1 % cream Apply 1 application topically daily as needed (for rash).  esomeprazole (NEXIUM) 40 MG capsule Take 40 mg by mouth 2 (two) times daily before a meal.      fluticasone (CUTIVATE) 0.05 % cream Apply topically 2 (two) times daily. 30 g 1   fluticasone (FLONASE) 50 MCG/ACT nasal spray Place 1 spray into both nostrils 2 (two) times daily.     folic acid (FOLVITE) 400 MCG tablet Take 400 mcg by mouth daily.     furosemide (LASIX) 40 MG tablet Take 1 tablet by mouth twice daily 180 tablet 1   glimepiride (AMARYL) 4 MG tablet Take 4 mg by mouth daily with breakfast.      hydrOXYzine (ATARAX/VISTARIL) 25 MG tablet Take 1 tablet (25 mg total) by mouth every 6 (six) hours. Prn itching 20 tablet 1   insulin degludec (TRESIBA FLEXTOUCH) 200 UNIT/ML FlexTouch Pen INJECT 80  UNITS SUBCUTANEOUSLY IN THE MORNING AND 80 IN THE EVENING INCREASE AS DIRECTED PER CLINIC     methocarbamol (ROBAXIN) 500 MG tablet Take 1 tablet by mouth daily.     MOUNJARO 7.5 MG/0.5ML Pen Inject 2 mLs into the skin once a week.     mupirocin ointment (BACTROBAN) 2 % Apply 1 application. topically 2 (two) times daily as needed.     nebivolol (BYSTOLIC) 10 MG tablet Take 10 mg by mouth daily.     nystatin cream (MYCOSTATIN) Apply 1 application. topically 2 (two) times daily as needed.     oxyCODONE-acetaminophen (PERCOCET/ROXICET) 5-325 MG tablet Take 1 tablet by mouth every 4 (four) hours as needed for severe pain.     potassium chloride (K-DUR) 10 MEQ tablet Take 10 mEq by mouth daily.      triamcinolone cream (KENALOG) 0.1 % Apply 1 application. topically 2 (two) times daily as needed.     valsartan (DIOVAN) 320 MG tablet Take 1 tablet by mouth once daily 90 tablet 1   No current facility-administered medications for this visit.    Allergies:   Shrimp [shellfish allergy], Tetracycline, Metformin, Penicillin g, and Penicillins    Social History:  The patient  reports that she has never smoked. She has never used smokeless tobacco. She reports that she does not drink alcohol and does not use drugs.   Family History:  The patient's family history includes Arthritis in her father; Diabetes in her brother and sister; Gout in her father; Heart attack in her sister; Heart disease in her father; Heart failure in her father; Hypertension in her brother, father, and sister; Stroke in her brother and mother.    ROS:  Please see the history of present illness.  (+) LE edema (+) Wheezing All other systems are reviewed and negative.   PHYSICAL EXAM: VS:  There were no vitals taken for this visit. , BMI There is no height or weight on file to calculate BMI. GENERAL:  Well appearing HEENT:  Pupils equal round and reactive, fundi not visualized, oral mucosa unremarkable NECK:  No jugular venous  distention, waveform within normal limits, carotid upstroke brisk and symmetric, no bruits, no thyromegaly LUNGS:  Clear to auscultation bilaterally HEART:  RRR.  PMI not displaced or sustained,S1 and S2 within normal limits, no S3, no S4, no clicks, no rubs, no murmurs ABD:  Flat, positive bowel sounds normal in frequency in pitch, no bruits, no rebound, no guarding, no midline pulsatile mass, no hepatomegaly, no splenomegaly EXT:  2 plus pulses throughout, bilateral lymphedema, no cyanosis no clubbing SKIN:  No rashes no nodules NEURO:  Cranial nerves II through XII grossly  intact, motor grossly intact throughout [facility]:  Cognitively intact, oriented to person place and time  Echo 06/23/2016 - Left ventricle: The cavity size was normal. Systolic function was    normal. The estimated ejection fraction was in the range of 55%    to 60%. Wall motion was normal; there were no regional wall    motion abnormalities. The transmitral flow pattern was normal.    The pulmonary vein flow pattern was normal. Left ventricular    diastolic function parameters were mildly abnormal for age.    Indeterminate filling pressure by Doppler parameters.  - Atrial septum: No defect or patent foramen ovale was identified.   EKG:  EKG was not ordered today 07/21/22: Sinus rhythm. Rate 65 bpm. First Degree AV Block 08/30/21: sinus rhythm.  Rate 68 bpm  Recent Labs: 07/21/2022: ALT 25; BUN 9; Creatinine, Ser 0.81; Potassium 4.2; Sodium 138   08/23/2021: Sodium 143, potassium 3.7, BUN 11, creatinine 0.7 WBC 9, hemoglobin 11.8, hematocrit 37.4, platelets 256  Lipid Panel    Component Value Date/Time   CHOL 173 07/21/2022 1006   TRIG 94 07/21/2022 1006   HDL 62 07/21/2022 1006   CHOLHDL 2.8 07/21/2022 1006   CHOLHDL 3.8 05/25/2009 0605   VLDL 20 05/25/2009 0605   LDLCALC 94 07/21/2022 1006      Wt Readings from Last 3 Encounters:  07/24/22 220 lb (99.8 kg)  07/21/22 (!) 328 lb 6.4 oz (149 kg)  12/19/21 (!)  335 lb (152 kg)      ASSESSMENT AND PLAN:  No problem-specific Assessment & Plan notes found for this encounter.     Current medicines are reviewed at length with the patient today.  The patient has concerns regarding medicines.  The following changes have been made:  Stop HCTZ and lisinopril.  Add valsartan.   Labs/ tests ordered today include:   No orders of the defined types were placed in this encounter.   Disposition:    FU with Johnthan Axtman C. Duke Salvia, MD, [facility] in 69-months    Signed, Avyukth Bontempo C. Duke Salvia, MD, [facility]  01/20/2023 8:09 AM    [facility]

## 2023-01-21 ENCOUNTER — Encounter (INDEPENDENT_AMBULATORY_CARE_PROVIDER_SITE_OTHER): Payer: Medicare HMO | Admitting: Family Medicine

## 2023-02-02 DIAGNOSIS — J42 Unspecified chronic bronchitis: Secondary | ICD-10-CM | POA: Diagnosis not present

## 2023-02-02 DIAGNOSIS — J449 Chronic obstructive pulmonary disease, unspecified: Secondary | ICD-10-CM | POA: Diagnosis not present

## 2023-02-07 ENCOUNTER — Other Ambulatory Visit (HOSPITAL_BASED_OUTPATIENT_CLINIC_OR_DEPARTMENT_OTHER): Payer: Self-pay | Admitting: Cardiovascular Disease

## 2023-02-09 NOTE — Telephone Encounter (Signed)
Rx(s) sent to pharmacy electronically.  

## 2023-02-11 ENCOUNTER — Ambulatory Visit (HOSPITAL_BASED_OUTPATIENT_CLINIC_OR_DEPARTMENT_OTHER): Payer: Medicare HMO | Admitting: Cardiovascular Disease

## 2023-02-18 ENCOUNTER — Other Ambulatory Visit (HOSPITAL_BASED_OUTPATIENT_CLINIC_OR_DEPARTMENT_OTHER): Payer: Self-pay

## 2023-02-27 ENCOUNTER — Telehealth (HOSPITAL_BASED_OUTPATIENT_CLINIC_OR_DEPARTMENT_OTHER): Payer: Self-pay | Admitting: Cardiovascular Disease

## 2023-02-27 NOTE — Telephone Encounter (Signed)
[facility]---she states she will call the patiet today to schedule

## 2023-04-09 DIAGNOSIS — R197 Diarrhea, unspecified: Secondary | ICD-10-CM | POA: Diagnosis not present

## 2023-04-09 DIAGNOSIS — E876 Hypokalemia: Secondary | ICD-10-CM | POA: Diagnosis not present

## 2023-04-09 DIAGNOSIS — I110 Hypertensive heart disease with heart failure: Secondary | ICD-10-CM | POA: Diagnosis not present

## 2023-04-16 DIAGNOSIS — J449 Chronic obstructive pulmonary disease, unspecified: Secondary | ICD-10-CM | POA: Diagnosis not present

## 2023-04-16 DIAGNOSIS — J42 Unspecified chronic bronchitis: Secondary | ICD-10-CM | POA: Diagnosis not present

## 2023-05-12 ENCOUNTER — Other Ambulatory Visit (HOSPITAL_BASED_OUTPATIENT_CLINIC_OR_DEPARTMENT_OTHER): Payer: Self-pay | Admitting: Cardiovascular Disease

## 2023-06-05 DIAGNOSIS — J42 Unspecified chronic bronchitis: Secondary | ICD-10-CM | POA: Diagnosis not present

## 2023-06-05 DIAGNOSIS — J449 Chronic obstructive pulmonary disease, unspecified: Secondary | ICD-10-CM | POA: Diagnosis not present

## 2023-06-08 ENCOUNTER — Ambulatory Visit (INDEPENDENT_AMBULATORY_CARE_PROVIDER_SITE_OTHER): Payer: Medicare HMO | Admitting: Cardiovascular Disease

## 2023-06-08 ENCOUNTER — Encounter (HOSPITAL_BASED_OUTPATIENT_CLINIC_OR_DEPARTMENT_OTHER): Payer: Self-pay | Admitting: Cardiovascular Disease

## 2023-06-08 VITALS — BP 130/70 | HR 65 | Ht 63.0 in | Wt 328.0 lb

## 2023-06-08 DIAGNOSIS — G4733 Obstructive sleep apnea (adult) (pediatric): Secondary | ICD-10-CM

## 2023-06-08 DIAGNOSIS — I890 Lymphedema, not elsewhere classified: Secondary | ICD-10-CM | POA: Diagnosis not present

## 2023-06-08 DIAGNOSIS — I1A0 Resistant hypertension: Secondary | ICD-10-CM | POA: Diagnosis not present

## 2023-06-08 DIAGNOSIS — E7800 Pure hypercholesterolemia, unspecified: Secondary | ICD-10-CM | POA: Diagnosis not present

## 2023-06-08 DIAGNOSIS — I5032 Chronic diastolic (congestive) heart failure: Secondary | ICD-10-CM

## 2023-06-08 DIAGNOSIS — J4530 Mild persistent asthma, uncomplicated: Secondary | ICD-10-CM

## 2023-06-08 DIAGNOSIS — I10 Essential (primary) hypertension: Secondary | ICD-10-CM

## 2023-06-08 DIAGNOSIS — I509 Heart failure, unspecified: Secondary | ICD-10-CM | POA: Diagnosis not present

## 2023-06-08 MED ORDER — AZITHROMYCIN 500 MG PO TABS: 500.0000 mg | ORAL_TABLET | Freq: Every day | ORAL | 0 refills | Status: DC

## 2023-06-08 NOTE — Patient Instructions (Signed)
Medication Instructions:  START AZITHROMYCIN 500 MG DAILY FOR 3 DAYS ONLY   *If you need a refill on your cardiac medications before your next appointment, please call your pharmacy*  Lab Work: LP/CMET TODAY   If you have labs (blood work) drawn today and your tests are completely normal, you will receive your results only by: MyChart Message (if you have MyChart) OR A paper copy in the mail If you have any lab test that is abnormal or we need to change your treatment, we will call you to review the results.  Testing/Procedures: NONE   Follow-Up: At [facility], you and your health needs are our priority.  As part of our continuing mission to provide you with exceptional heart care, we have created designated Provider Care Teams.  These Care Teams include your primary Cardiologist (physician) and Advanced Practice Providers (APPs -  Physician Assistants and Nurse Practitioners) who all work together to provide you with the care you need, when you need it.  We recommend signing up for the patient portal called "MyChart".  Sign up information is provided on this After Visit Summary.  MyChart is used to connect with patients for Virtual Visits (Telemedicine).  Patients are able to view lab/test results, encounter notes, upcoming appointments, etc.  Non-urgent messages can be sent to your provider as well.   To learn more about what you can do with MyChart, go to [email or web address].    Your next appointment:   6 month(s)  Provider:   Gillian Shields, NP   DR [employer] IN 1 YEAR    You have been referred to PULMONARY   You have been referred to [facility]  IF YOU DO NOT HEAR FROM THEM IN 2 WEEKS CALL THEM DIRECTLY AT [phone]

## 2023-06-08 NOTE — Progress Notes (Unsigned)
Cardiology Office Note:  .    Date:  06/08/2023  ID:  Katelyn Howard, DOB 1961/01/20, MRN [id number] PCP: Rodrigo Ran, MD  [facility] Providers Cardiologist:  Chilton Si, MD { Click to update primary MD,subspecialty MD or APP then REFRESH:1}    History of Present Illness: .    Katelyn Howard is a 62 y.o. female with resistant hypertension, OSA on CPAP, asthma, morbid obesity, and diabetes, here for follow-up.  She was first seen in advanced hypertension clinic 08/2021.  She initially reported shortness of breath. She saw her PCP and increased lasix from 40 mg daily to 40 mg twice daily with some improvement.  She did note LE edema, orthopnea and PND.  Since starting lasix her breathing improved but was not back to baseline.  Her exercise was limited due to back pain.  She used a cane for walking.  She didn't limit sodium intake or follow a particular diet.     Her blood pressure was uncontrolled. HCTZ was stopped and lasix was increased to 40 mg twice daily. She was started on valsartan and referred to the PREP program.  She had not yet started Valsartan and her blood pressure remained above goal. She had renal dopplers that were normal. Echo 09/06/21 revealed LVF 60-6% with grade I diastolic dysfunction. She was enrolled in a patient monitoring study and reported diarrhea after starting Valsartan. She was switched back to losartan/HCTZ and amlodipine was increased to 10mg . Blood pressure was better controlled at follow-up.   She struggled with LE edema.  She saw a vascular doctor who recommended compression socks but she was unable to tolerate them because they are too tight on her ankles.  At her visit 07/2022 blood pressure was much better controlled and she was encouraged to work on increasing her exercise.   At her appointment 01/2023 she complained of ongoing shortness of breath but had been prescribed a new inhaler which was helping. She still struggled with LE edema  despite taking 3 tablets of her diuretic daily. Blood pressure was initially elevated but improved on repeat. She was tolerating Mounjaro but continued to struggle with weight loss. We increased Mounjaro to 12.5 mg weekly and referred her to the [facility] clinic. She was also referred to the lymphedema clinic for consideration of Unna boot and lymphedema pump.  Today, she reports using her inhalers more often to manage her asthma and sputum production. Her symptoms have been exacerbated by the recent colder weather, worse in the past week. She also complains of wheezing and chills but is afebrile. Her lymphedema is still present despite compliance with her diuretic. She has not had any contact regarding her prior referral to the lymphedema clinic. In the office her blood pressure is 130/70. She has not yet taken her medications this morning, she would normally take them soon at breakfast time. She takes Valsartan and Bystolic in the mornings. EKG performed today shows NSR at 65 bpm with low voltage. She continues to take her Greggory Keen but has not noticed any significant changes in her appetite or weight, which has been stable since June. She denies any palpitations, chest pain, lightheadedness, headaches, syncope, orthopnea, or PND.  Prior antihypertensives:  Valsartan  ROS:  Please see the history of present illness. All other systems are reviewed and negative.  (+) Shortness of breath (+) Sputum production (+) Chills (+) Wheezing (+) LE lymphedema  Studies Reviewed: Marland Kitchen   EKG Interpretation Date/Time:  Monday June 08 2023 08:55:40 EDT Ventricular Rate:  65 PR Interval:  214 QRS Duration:  76 QT Interval:  420 QTC Calculation: 436 R Axis:   83  Text Interpretation: Sinus rhythm with 1st degree A-V block Low voltage QRS When compared with ECG of 29-Nov-2019 16:34, No significant change was found Confirmed by Chilton Si ([id number]) on 06/08/2023 9:02:52 AM    Risk  Assessment/Calculations:             Physical Exam:    VS:  BP 130/70 (BP Location: Left Arm, Patient Position: Sitting, Cuff Size: Large)   Pulse 65   Ht 5\' 3"  (1.6 m)   Wt (!) 328 lb (148.8 kg)   SpO2 98%   BMI 58.10 kg/m  , BMI Body mass index is 58.1 kg/m. GENERAL:  Well appearing HEENT: Pupils equal round and reactive, fundi not visualized, oral mucosa unremarkable NECK:  No jugular venous distention, waveform within normal limits, carotid upstroke brisk and symmetric, no bruits, no thyromegaly LUNGS:  Clear to auscultation bilaterally HEART:  RRR.  PMI not displaced or sustained,S1 and S2 within normal limits, no S3, no S4, no clicks, no rubs, no murmurs ABD:  Flat, positive bowel sounds normal in frequency in pitch, no bruits, no rebound, no guarding, no midline pulsatile mass, no hepatomegaly, no splenomegaly EXT:  2 plus pulses throughout, no edema, no cyanosis no clubbing SKIN:  No rashes no nodules NEURO:  Cranial nerves II through XII grossly intact, motor grossly intact throughout PSYCH:  Cognitively intact, oriented to person place and time  Wt Readings from Last 3 Encounters:  06/08/23 (!) 328 lb (148.8 kg)  01/20/23 (!) 328 lb (148.8 kg)  07/24/22 220 lb (99.8 kg)     ASSESSMENT AND PLAN: .    # Asthma Increased symptoms with increased inhaler use and phlegm production for over a week now.  No fever but reports chills. No current pulmonologist managing asthma. -Refer to pulmonologist Dr. Everardo All for further management. -Will give azithromycin 500mg  daily x3 days for bronchitis  # Lymphedema Persistent leg swelling despite diuretic use. Previous referral to lymphedema clinic with no follow-up. -Follow up on previous referral to lymphedema clinic.  # Hyperlipidemia Last cholesterol panel in December showed >70.  -Continue atorvastatin. -Order lipid panel and comprehensive metabolic panel today.  # Hypertension Blood pressure well controlled on current  regimen of amlodipine, clonidine, Lasix, and valsartan. -Continue current regimen. -Follow up in six months with Caitlin from our team to monitor blood pressure.  # Weight Management No significant change in weight despite use of tirzapeptide.  Weight has been stable.  Patient unable to afford [facility].  -Recommend free nutrition class on the twenty-fourth. -Encouraged her to limit carbohydrate intake and increase exercise once bronchitis resolves      Dispo:  FU with APP in 6 months. FU with Finn Altemose C. Duke Salvia, MD, [facility] in 1 year.  I,Mathew Stumpf,acting as a Neurosurgeon for Chilton Si, MD.,have documented all relevant documentation on the behalf of Chilton Si, MD,as directed by  Chilton Si, MD while in the presence of Chilton Si, MD.  I, Lianni Kanaan C. Duke Salvia, MD have reviewed all documentation for this visit.  The documentation of the exam, diagnosis, procedures, and orders on 06/08/2023 are all accurate and complete.   Signed, Chilton Si, MD

## 2023-06-09 LAB — LIPID PANEL
Chol/HDL Ratio: 2.9 ratio (ref 0.0–4.4)
Cholesterol, Total: 167 mg/dL (ref 100–199)
HDL: 58 mg/dL (ref >39)
LDL Chol Calc (NIH): 92 mg/dL (ref 0–99)
Triglycerides: 93 mg/dL (ref 0–149)
VLDL Cholesterol Cal: 17 mg/dL (ref 5–40)

## 2023-06-09 LAB — COMPREHENSIVE METABOLIC PANEL
ALT: 43 [IU]/L — ABNORMAL HIGH (ref 0–32)
AST: 39 [IU]/L (ref 0–40)
Albumin: 4.2 g/dL (ref 3.9–4.9)
Alkaline Phosphatase: 147 [IU]/L — ABNORMAL HIGH (ref 44–121)
BUN/Creatinine Ratio: 15 (ref 12–28)
BUN: 11 mg/dL (ref 8–27)
Bilirubin Total: 0.3 mg/dL (ref 0.0–1.2)
CO2: 23 mmol/L (ref 20–29)
Calcium: 9.4 mg/dL (ref 8.7–10.3)
Chloride: 100 mmol/L (ref 96–106)
Creatinine, Ser: 0.72 mg/dL (ref 0.57–1.00)
Globulin, Total: 3.6 g/dL (ref 1.5–4.5)
Glucose: 61 mg/dL — ABNORMAL LOW (ref 70–99)
Potassium: 4.2 mmol/L (ref 3.5–5.2)
Sodium: 141 mmol/L (ref 134–144)
Total Protein: 7.8 g/dL (ref 6.0–8.5)
eGFR: 94 mL/min/{1.73_m2} (ref >59)

## 2023-06-11 ENCOUNTER — Encounter: Payer: Self-pay | Admitting: *Deleted

## 2023-06-11 ENCOUNTER — Ambulatory Visit
Admission: EM | Admit: 2023-06-11 | Discharge: 2023-06-11 | Disposition: A | Discharge status: Home / Self Care | Payer: Medicare HMO | Attending: Family Medicine | Admitting: Family Medicine

## 2023-06-11 ENCOUNTER — Other Ambulatory Visit: Payer: Self-pay

## 2023-06-11 DIAGNOSIS — L02213 Cutaneous abscess of chest wall: Secondary | ICD-10-CM

## 2023-06-11 MED ORDER — SULFAMETHOXAZOLE-TRIMETHOPRIM 800-160 MG PO TABS: 1.0000 | ORAL_TABLET | Freq: Two times a day (BID) | ORAL | 0 refills | Status: AC

## 2023-06-11 NOTE — Discharge Instructions (Addendum)
Continue taking your oxycodone as needed. Finish all the antibiotic prescribed.

## 2023-06-11 NOTE — ED Triage Notes (Signed)
Pt reports "bump" right ribs under breast. Very painful. Not draining. She has taking oxycodone, muscle relaxers, and tylenol without relief. She was given a few doses of zithromax on 06/08/23 that she has completed

## 2023-06-11 NOTE — ED Provider Notes (Signed)
[facility]   [id number] 06/11/23 Arrival Time: 1608  ASSESSMENT & PLAN:  1. Abscess of chest wall     Incision and Drainage Procedure Note  Anesthesia: 1% lidocaine with epinephrine  Procedure Details  The procedure, risks and complications have been discussed in detail (including, but not limited to pain and bleeding) with the patient.  The skin induration was prepped and draped in the usual fashion. After adequate local anesthesia, I&D with a #11 blade was performed on the right chest wall under R breast (chaperone present to assist) with copious, bloody, purulent drainage.  EBL: minimal Drains: none Packing: 1" iodoform Condition: Tolerated procedure well Complications: none.  Meds ordered this encounter  Medications   sulfamethoxazole-trimethoprim (BACTRIM DS) 800-160 MG tablet    Sig: Take 1 tablet by mouth 2 (two) times daily for 7 days.    Dispense:  14 tablet    Refill:  0     Discharge Instructions      Continue taking your oxycodone as needed. Finish all the antibiotic prescribed.     Follow-up Information     [facility]).   Specialty: Urgent Care Why: In 48 hours for wound check and packing removal. Contact information: [address] [place] [address] [phone]                 Wound care instructions discussed and given in written format. To return in 48 hours for wound check.  Finish all antibiotics. OTC analgesics as needed.  Reviewed expectations re: course of current medical issues. Questions answered. Outlined signs and symptoms indicating need for more acute intervention. Patient verbalized understanding. After Visit Summary given.   SUBJECTIVE:  Katelyn Howard is a 62 y.o. female who presents with a possible infection of her R chest wall under breast; gradual onset, a few days ago without active drainage and without active bleeding.  Symptoms have gradually worsened since beginning. Fever: absent.   OBJECTIVE:  Vitals:   06/11/23 1719 06/11/23 1750  BP: (!) 197/75 137/78  Pulse: 69 69  Resp: (!) 22 20  Temp: 98.4 F (36.9 C)   TempSrc: Oral   SpO2: 98%     Recheck BP: 137/78  General appearance: alert; no distress R chest wall: approx 1.5 cm induration present; very tender to touch; no active drainage or bleeding Psychological: alert and cooperative; normal mood and affect  Allergies  Allergen Reactions   Shrimp [Shellfish Allergy] Anaphylaxis   Tetracycline Nausea And Vomiting    Other reaction(s): Other (See Comments)   Metformin     Other reaction(s): Unknown   Penicillin G     Other reaction(s): Unknown   Penicillins Itching, Swelling and Other (See Comments)    Has patient had a PCN reaction causing immediate rash, facial/tongue/throat swelling, SOB or lightheadedness with hypotension: Unknown Has patient had a PCN reaction causing severe rash involving mucus membranes or skin necrosis: Unknown Has patient had a PCN reaction that required hospitalization: Unknown Has patient had a PCN reaction occurring within the last 10 years: No If all of the above answers are "NO", then may proceed with Cephalosporin use.     Past Medical History:  Diagnosis Date   (HFpEF) heart failure with preserved ejection fraction (HCC) 09/05/2021   Allergy    Arthritis    Asthma    CHF (congestive heart failure) (HCC) 2008   Diabetes mellitus    Edema extremities    GERD (  gastroesophageal reflux disease)    Heart failure, type unknown (HCC) 09/05/2021   Hyperlipidemia    Hypertension    Lymphedema 01/20/2023   Neuromuscular disorder (HCC)    neuropathy   Obesity    OSA (obstructive sleep apnea) 10/13/2016   Severe with AHI 44.9/hr now on CPAP at 11cm H2O   Sleep apnea    wears cpap    Social History   Socioeconomic History   Marital status: Married    Spouse name: Not on file   Number of children:  Not on file   Years of education: Not on file   Highest education level: Not on file  Occupational History   Not on file  Tobacco Use   Smoking status: Never   Smokeless tobacco: Never  Substance and Sexual Activity   Alcohol use: No   Drug use: No   Sexual activity: Yes    Birth control/protection: Surgical  Other Topics Concern   Not on file  Social History Narrative   Not on file   Social Determinants of Health   Financial Resource Strain: Not on file  Food Insecurity: Not on file  Transportation Needs: Not on file  Physical Activity: Not on file  Stress: Not on file  Social Connections: Not on file   Family History  Problem Relation Age of Onset   Stroke Mother    Heart failure Father    Heart disease Father    Hypertension Father    Arthritis Father    Gout Father    Hypertension Sister    Diabetes Sister    Heart attack Sister    Diabetes Brother    Stroke Brother    Hypertension Brother    Colon cancer Neg Hx    Colon polyps Neg Hx    Rectal cancer Neg Hx    Stomach cancer Neg Hx    Past Surgical History:  Procedure Laterality Date   ABDOMINAL HYSTERECTOMY     EYE SURGERY              Mardella Layman, MD 06/11/23 1819

## 2023-07-30 ENCOUNTER — Encounter: Payer: Self-pay | Admitting: Adult Health

## 2023-07-30 ENCOUNTER — Ambulatory Visit (INDEPENDENT_AMBULATORY_CARE_PROVIDER_SITE_OTHER): Payer: Medicare HMO | Admitting: Adult Health

## 2023-07-30 VITALS — BP 139/73 | HR 78 | Ht 63.0 in | Wt 334.0 lb

## 2023-07-30 DIAGNOSIS — G4733 Obstructive sleep apnea (adult) (pediatric): Secondary | ICD-10-CM

## 2023-07-30 NOTE — Patient Instructions (Addendum)
Continue using CPAP nightly and greater than 4 hours each night If your symptoms worsen or you develop new symptoms please let us know.  Call [facility]: [phone]

## 2023-08-04 ENCOUNTER — Telehealth: Payer: Self-pay | Admitting: *Deleted

## 2023-08-04 NOTE — Telephone Encounter (Signed)
New, Maryella Shivers, Otilio Jefferson, RN; Natale Lay; 1 other Received, Thank you!

## 2023-08-04 NOTE — Telephone Encounter (Signed)
Cpap supply order sent to [facility].

## 2023-08-25 DIAGNOSIS — J42 Unspecified chronic bronchitis: Secondary | ICD-10-CM | POA: Diagnosis not present

## 2023-08-25 DIAGNOSIS — J449 Chronic obstructive pulmonary disease, unspecified: Secondary | ICD-10-CM | POA: Diagnosis not present

## 2023-08-31 ENCOUNTER — Institutional Professional Consult (permissible substitution) (HOSPITAL_BASED_OUTPATIENT_CLINIC_OR_DEPARTMENT_OTHER): Payer: Medicare HMO | Admitting: Pulmonary Disease

## 2023-08-31 ENCOUNTER — Encounter: Payer: Self-pay | Admitting: Emergency Medicine

## 2023-08-31 ENCOUNTER — Ambulatory Visit
Admission: EM | Admit: 2023-08-31 | Discharge: 2023-08-31 | Disposition: A | Discharge status: Home / Self Care | Payer: Medicare HMO | Attending: Family Medicine | Admitting: Family Medicine

## 2023-08-31 DIAGNOSIS — L02213 Cutaneous abscess of chest wall: Secondary | ICD-10-CM | POA: Diagnosis not present

## 2023-08-31 MED ORDER — SULFAMETHOXAZOLE-TRIMETHOPRIM 800-160 MG PO TABS: 1.0000 | ORAL_TABLET | Freq: Two times a day (BID) | ORAL | 0 refills | Status: AC

## 2023-08-31 MED ORDER — LIDOCAINE-EPINEPHRINE 1 %-1:100000 IJ SOLN
5.0000 mL | Freq: Once | INTRAMUSCULAR | Status: AC
  Administered 2023-08-31: 5 mL via INTRADERMAL

## 2023-08-31 NOTE — ED Triage Notes (Signed)
 Pt states, " I have a boil on my right side. Initially noticed on 08/25/2023. Says she had one previously that had to be drained in Oct 2024. Has tried OTC product to help but nothing is working and it is painful. Unable to wear a bra."

## 2023-08-31 NOTE — ED Provider Notes (Addendum)
 [facility] CARE CENTER   [id number] 08/31/23 Arrival Time: 1402  ASSESSMENT & PLAN:  1. Abscess of chest wall    Incision and Drainage Procedure Note  Anesthesia: 1% lidocaine  with epinephrine   Procedure Details  The procedure, risks and complications have been discussed in detail (including, but not limited to pain and bleeding) with the patient.  The skin induration was prepped and draped in the usual fashion. After adequate local anesthesia, I&D with a #11 blade was performed on the right chest wall under right breast (RN chaperone present) with copious, bloody, purulent drainage.  EBL: minimal Drains: none Packing: 1 iodoform  Condition: Tolerated procedure well Complications: none.  Meds ordered this encounter  Medications   lidocaine -EPINEPHrine  (XYLOCAINE  W/EPI) 1 %-1:100000 (with pres) injection 5 mL   sulfamethoxazole -trimethoprim  (BACTRIM  DS) 800-160 MG tablet    Sig: Take 1 tablet by mouth 2 (two) times daily for 7 days.    Dispense:  14 tablet    Refill:  0   Reports that she has pain medicine at home.  Reviewed expectations re: course of current medical issues. Questions answered. Outlined signs and symptoms indicating need for more acute intervention. Patient verbalized understanding. After Visit Summary given.   SUBJECTIVE:  Katelyn Howard is a 63 y.o. female who presents with a possible infection of her R chest wall. Onset gradual, approximately 5 d ago; without active drainage and without active bleeding. Symptoms have gradually worsened since beginning; increasing pain. Fever: absent. No tx PTA.   OBJECTIVE:  Vitals:   08/31/23 1447 08/31/23 1451  BP:  119/73  Pulse:  74  Resp:  (!) 22  Temp:  98.3 F (36.8 C)  TempSrc:  Oral  SpO2:  98%  Weight: (!) 151.5 kg   Height: 5' 3 (1.6 m)     General appearance: alert; no distress Chest wall: approx 1x2 cm induration of her chest wall under R breast; tender to touch; no active drainage or  bleeding Psychological: alert and cooperative; normal mood and affect  Allergies  Allergen Reactions   Shrimp [Shellfish Allergy] Anaphylaxis   Tetracycline Nausea And Vomiting    Other reaction(s): Other (See Comments)  Other Reaction(s): Other (See Comments)   Metformin     Other reaction(s): Unknown   Penicillin G     Other reaction(s): Unknown   Penicillins Itching, Other (See Comments) and Swelling    Has patient had a PCN reaction causing immediate rash, facial/tongue/throat swelling, SOB or lightheadedness with hypotension: Unknown  Has patient had a PCN reaction causing severe rash involving mucus membranes or skin necrosis: Unknown  Has patient had a PCN reaction that required hospitalization: Unknown  Has patient had a PCN reaction occurring within the last 10 years: No  If all of the above answers are NO, then may proceed with Cephalosporin use.  Other Reaction(s): Other (See Comments)  Has patient had a PCN reaction causing immediate rash, facial/tongue/throat swelling, SOB or lightheadedness with hypotension: Unknown, Has patient had a PCN reaction causing severe rash involving mucus membranes or skin necrosis: Unknown, Has patient had a PCN reaction that required hospitalization: Unknown, Has patient had a PCN reaction occurring within the last 10 years: No, If all of the above answers are NO, then may proceed with Cephalosporin use.    Past Medical History:  Diagnosis Date   (HFpEF) heart failure with preserved ejection fraction (HCC) 09/05/2021   Allergy    Arthritis    Asthma    CHF (congestive heart failure) (HCC)  2008   Diabetes mellitus    Edema extremities    GERD (gastroesophageal reflux disease)    Heart failure, type unknown (HCC) 09/05/2021   Hyperlipidemia    Hypertension    Lymphedema 01/20/2023   Neuromuscular disorder (HCC)    neuropathy   Obesity    OSA (obstructive sleep apnea) 10/13/2016   Severe with AHI 44.9/hr now on CPAP at 11cm  H2O   Sleep apnea    wears cpap    Social History   Socioeconomic History   Marital status: Married    Spouse name: Not on file   Number of children: Not on file   Years of education: Not on file   Highest education level: Not on file  Occupational History   Not on file  Tobacco Use   Smoking status: Never    Passive exposure: Never   Smokeless tobacco: Never  Vaping Use   Vaping status: Never Used  Substance and Sexual Activity   Alcohol  use: No   Drug use: No   Sexual activity: Yes    Birth control/protection: Surgical  Other Topics Concern   Not on file  Social History Narrative   Not on file   Social Drivers of Health   Financial Resource Strain: Not on file  Food Insecurity: Not on file  Transportation Needs: Not on file  Physical Activity: Not on file  Stress: Not on file  Social Connections: Not on file   Family History  Problem Relation Age of Onset   Stroke Mother    Heart failure Father    Heart disease Father    Hypertension Father    Arthritis Father    Gout Father    Hypertension Sister    Diabetes Sister    Heart attack Sister    Diabetes Brother    Stroke Brother    Hypertension Brother    Colon cancer Neg Hx    Colon polyps Neg Hx    Rectal cancer Neg Hx    Stomach cancer Neg Hx    Past Surgical History:  Procedure Laterality Date   ABDOMINAL HYSTERECTOMY     EYE SURGERY              Rolinda Rogue, MD 08/31/23 LAURIAN    Rolinda Rogue, MD 08/31/23 [phone]

## 2023-09-01 ENCOUNTER — Encounter (HOSPITAL_BASED_OUTPATIENT_CLINIC_OR_DEPARTMENT_OTHER): Payer: Self-pay | Admitting: Pulmonary Disease

## 2023-09-02 ENCOUNTER — Ambulatory Visit
Admission: RE | Admit: 2023-09-02 | Discharge: 2023-09-02 | Disposition: A | Discharge status: Home / Self Care | Payer: Medicare HMO | Source: Ambulatory Visit | Attending: Internal Medicine | Admitting: Internal Medicine

## 2023-09-02 VITALS — BP 129/73 | HR 66 | Temp 97.9°F | Resp 18

## 2023-09-02 DIAGNOSIS — L02213 Cutaneous abscess of chest wall: Secondary | ICD-10-CM | POA: Diagnosis not present

## 2023-09-02 DIAGNOSIS — Z5189 Encounter for other specified aftercare: Secondary | ICD-10-CM | POA: Diagnosis not present

## 2023-09-02 NOTE — ED Provider Notes (Signed)
 [facility]    CSN: [id number] Arrival date & time: 09/02/23  1557      History   Chief Complaint Chief Complaint  Patient presents with   Follow Up    HPI Katelyn Howard is a 63 y.o. female.   Patient presents today for recheck.  She was seen on 08/31/2023 at which point she had an abscess of her chest wall inferior to her right lateral breast that was successfully drained in clinic.  She was started on antibiotics and has been compliant with this medication without side effect (Bactrim  DS).  She reports that the area continues to be sore but is significantly improved compared to when she was seen several days ago.  She does have a history of recurrent abscesses in this area but has never seen a surgeon or dermatologist.  Denies any fever, nausea, vomiting.  She continues to have significant drainage from the wound.    Past Medical History:  Diagnosis Date   (HFpEF) heart failure with preserved ejection fraction (HCC) 09/05/2021   Allergy    Arthritis    Asthma    CHF (congestive heart failure) (HCC) 2008   Diabetes mellitus    Edema extremities    GERD (gastroesophageal reflux disease)    Heart failure, type unknown (HCC) 09/05/2021   Hyperlipidemia    Hypertension    Lymphedema 01/20/2023   Neuromuscular disorder (HCC)    neuropathy   Obesity    OSA (obstructive sleep apnea) 10/13/2016   Severe with AHI 44.9/hr now on CPAP at 11cm H2O   Sleep apnea    wears cpap     Patient Active Problem List   Diagnosis Date Noted   Lymphedema 01/20/2023   (HFpEF) heart failure with preserved ejection fraction (HCC) 09/05/2021   Intermittent asthma 03/22/2020   Super obesity 03/22/2020   Dependence on CPAP ventilation 03/22/2020   Obesity hypoventilation syndrome (HCC) 03/22/2020   At risk for central sleep apnea 03/22/2020   OSA (obstructive sleep apnea) 10/13/2016   Excessive daytime sleepiness 06/04/2016   Gasping for breath 06/04/2016   DM,  UNCOMPLICATED, TYPE II, UNCONTROLLED 02/12/2007   HYPERCHOLESTEROLEMIA, PURE 02/12/2007   Obesity 02/12/2007   Resistant hypertension 02/12/2007   ALLERGIC RHINITIS 02/12/2007   ASTHMA, CHILDHOOD 02/12/2007   DEGENERATIVE JOINT DISEASE 02/12/2007    Past Surgical History:  Procedure Laterality Date   ABDOMINAL HYSTERECTOMY     EYE SURGERY      OB History   No obstetric history on file.      Home Medications    Prior to Admission medications   Medication Sig Start Date End Date Taking? Authorizing Provider  albuterol  (PROVENTIL  HFA;VENTOLIN  HFA) 108 (90 BASE) MCG/ACT inhaler Inhale 2 puffs into the lungs every 6 (six) hours as needed. For wheezing   Yes [provider]  amLODipine  (NORVASC ) 10 MG tablet Take 1 tablet by mouth once daily 05/12/23  Yes Maudine Sos, MD  ARNUITY ELLIPTA 100 MCG/ACT AEPB Take 1 puff by mouth daily. 12/16/22  Yes [provider]  aspirin EC 81 MG tablet Take 81 mg by mouth daily.   Yes [provider]  atorvastatin (LIPITOR) 80 MG tablet Take 80 mg by mouth every morning.  04/25/15  Yes [provider]  Cholecalciferol 50000 units capsule Take 50,000 Units by mouth every Thursday.    Yes [provider]  cloNIDine  (CATAPRES ) 0.1 MG tablet Take 1 tablet (0.1 mg total) by mouth 2 (two) times daily. 03/08/16  Yes Zavitz, Joshua, MD  clotrimazole (LOTRIMIN) 1 % cream Apply 1 application topically daily as needed (for rash).   Yes [provider]  cyclobenzaprine (FLEXERIL) 10 MG tablet TAKE 1 TABLET BY MOUTH TWICE DAILY AS NEEDED FOR MUSCLE PAIN OR SPASMS 02/09/19  Yes [provider]  esomeprazole (NEXIUM) 40 MG capsule Take 40 mg by mouth 2 (two) times daily before a meal.  04/25/15  Yes [provider]  fluticasone  (CUTIVATE ) 0.05 % cream Apply topically 2 (two) times daily. 04/23/15  Yes Kindl, Tatiana Farrier, MD  fluticasone  (FLONASE ) 50 MCG/ACT nasal spray Place 1 spray into both nostrils 2  (two) times daily. 05/13/16  Yes [provider]  folic acid (FOLVITE) 400 MCG tablet Take 400 mcg by mouth daily.   Yes [provider]  furosemide  (LASIX ) 40 MG tablet Take 1 tablet by mouth twice daily 05/12/23  Yes Maudine Sos, MD  glimepiride (AMARYL) 4 MG tablet Take 4 mg by mouth daily with breakfast.  04/25/15  Yes [provider]  hydrOXYzine  (ATARAX /VISTARIL ) 25 MG tablet Take 1 tablet (25 mg total) by mouth every 6 (six) hours. Prn itching 04/23/15  Yes Kindl, Tatiana Farrier, MD  insulin degludec (TRESIBA FLEXTOUCH) 200 UNIT/ML FlexTouch Pen INJECT 80 UNITS SUBCUTANEOUSLY IN THE MORNING AND 80 IN THE EVENING INCREASE AS DIRECTED PER CLINIC 05/12/19  Yes [provider]  methocarbamol (ROBAXIN) 500 MG tablet Take 1 tablet by mouth daily. 05/21/16  Yes [provider]  mupirocin ointment (BACTROBAN) 2 % Apply 1 application. topically 2 (two) times daily as needed. 09/09/21  Yes [provider]  nebivolol (BYSTOLIC) 10 MG tablet Take 10 mg by mouth daily.   Yes [provider]  nystatin cream (MYCOSTATIN) Apply 1 application. topically 2 (two) times daily as needed. 10/28/21  Yes [provider]  oxyCODONE  (OXY IR/ROXICODONE ) 5 MG immediate release tablet Take 5 mg by mouth every 6 (six) hours as needed for moderate pain, severe pain or breakthrough pain. 12/22/22  Yes [provider]  oxyCODONE -acetaminophen  (PERCOCET/ROXICET) 5-325 MG tablet Take 1 tablet by mouth every 4 (four) hours as needed for severe pain.   Yes [provider]  potassium chloride (K-DUR) 10 MEQ tablet Take 10 mEq by mouth daily.    Yes [provider]  potassium chloride (KLOR-CON M) 10 MEQ tablet Take 10 mEq by mouth daily. 08/17/23  Yes [provider]  sulfamethoxazole -trimethoprim  (BACTRIM  DS) 800-160 MG tablet Take 1 tablet by mouth 2 (two) times daily for 7 days. 08/31/23 09/07/23 Yes Hagler, Polly Brink, MD  tirzepatide   (MOUNJARO ) 12.5 MG/0.5ML Pen Inject 12.5 mg into the skin once a week. 01/20/23  Yes Maudine Sos, MD  triamcinolone  cream (KENALOG ) 0.1 % Apply 1 application. topically 2 (two) times daily as needed. 10/28/21  Yes [provider]  valsartan  (DIOVAN ) 320 MG tablet Take 1 tablet by mouth once daily 02/09/23  Yes Maudine Sos, MD    Family History Family History  Problem Relation Age of Onset   Stroke Mother    Heart failure Father    Heart disease Father    Hypertension Father    Arthritis Father    Gout Father    Hypertension Sister    Diabetes Sister    Heart attack Sister    Diabetes Brother    Stroke Brother    Hypertension Brother    Colon cancer Neg Hx    Colon polyps Neg Hx    Rectal cancer Neg Hx  Stomach cancer Neg Hx     Social History Social History   Tobacco Use   Smoking status: Never    Passive exposure: Never   Smokeless tobacco: Never  Vaping Use   Vaping status: Never Used  Substance Use Topics   Alcohol  use: No   Drug use: No     Allergies   Shrimp [shellfish allergy], Tetracycline, Metformin, Penicillin g, and Penicillins   Review of Systems Review of Systems  Constitutional:  Negative for activity change, appetite change, fatigue and fever.  Gastrointestinal:  Negative for abdominal pain, diarrhea, nausea and vomiting.  Musculoskeletal:  Negative for arthralgias and myalgias.  Skin:  Positive for wound. Negative for color change.     Physical Exam Triage Vital Signs ED Triage Vitals  Encounter Vitals Group     BP 09/02/23 1624 129/73     Systolic BP Percentile --      Diastolic BP Percentile --      Pulse Rate 09/02/23 1624 66     Resp 09/02/23 1624 18     Temp 09/02/23 1624 97.9 F (36.6 C)     Temp Source 09/02/23 1624 Oral     SpO2 09/02/23 1624 95 %     Weight --      Height --      Head Circumference --      Peak Flow --      Pain Score 09/02/23 1626 0     Pain Loc --      Pain Education --      Exclude  from Growth Chart --    No data found.  Updated Vital Signs BP 129/73 (BP Location: Right Arm)   Pulse 66   Temp 97.9 F (36.6 C) (Oral)   Resp 18   SpO2 95%   Visual Acuity Right Eye Distance:   Left Eye Distance:   Bilateral Distance:    Right Eye Near:   Left Eye Near:    Bilateral Near:     Physical Exam Vitals reviewed.  Constitutional:      General: She is awake. She is not in acute distress.    Appearance: Normal appearance. She is well-developed. She is not ill-appearing.     Comments: Very pleasant female appears stated age in no acute distress sitting comfortably in exam room  HENT:     Head: Normocephalic and atraumatic.  Cardiovascular:     Rate and Rhythm: Normal rate and regular rhythm.     Heart sounds: Normal heart sounds, S1 normal and S2 normal. No murmur heard. Pulmonary:     Effort: Pulmonary effort is normal.     Breath sounds: Normal breath sounds. No wheezing, rhonchi or rales.     Comments: Clear to auscultation bilaterally Skin:         Comments: 2 cm incision with packing noted inferior lateral portion of right breast with associated purulent/sanguinous drainage.  There is surrounding erythema.  No streaking or evidence of lymphangitis.  Psychiatric:        Behavior: Behavior is cooperative.      UC Treatments / Results  Labs (all labs ordered are listed, but only abnormal results are displayed) Labs Reviewed - No data to display  EKG   Radiology No results found.  Procedures Procedures (including critical care time)  Medications Ordered in UC Medications - No data to display  Initial Impression / Assessment and Plan / UC Course  I have reviewed the triage vital signs and the nursing  notes.  Pertinent labs & imaging results that were available during my care of the patient were reviewed by me and considered in my medical decision making (see chart for details).     Patient is well-appearing, afebrile, nontoxic,  nontachycardic.  Packing was removed in clinic and then replaced with quarter inch iodoform packing.  She was encouraged to keep the area clean with soap and water and return in 2 days to have this reassessed and consider packing removal.  She is to continue Bactrim  DS as previously prescribed.  Recommended over-the-counter analgesics as needed for pain relief.  Discussed that if she has any worsening or changing symptoms including change in drainage, significant bleeding, fever, nausea, vomiting, worsening pain she should be seen immediately.  Strict return precautions given.  She declined work excuse note.  Final Clinical Impressions(s) / UC Diagnoses   Final diagnoses:  Abscess of chest wall  Wound check, abscess     Discharge Instructions      We repacked your wound.  Please return in 2 days to have this removed and reevaluated.  Continue the antibiotics that you are prescribed at your last visit.  Use Tylenol  and ibuprofen  for pain.  If you have any worsening symptoms including abnormal drainage, fever, nausea, vomiting, redness surrounding the wound, increasing pain you need to be seen immediately.    ED Prescriptions   None    PDMP not reviewed this encounter.   Budd Cargo, PA-C 09/02/23 1657

## 2023-09-02 NOTE — ED Triage Notes (Signed)
 Patient here for a f/u on cyst removed from her right side on 08/31/2023.  Patient was instructed to return for a follow up.

## 2023-09-02 NOTE — Discharge Instructions (Signed)
 We repacked your wound.  Please return in 2 days to have this removed and reevaluated.  Continue the antibiotics that you are prescribed at your last visit.  Use Tylenol  and ibuprofen  for pain.  If you have any worsening symptoms including abnormal drainage, fever, nausea, vomiting, redness surrounding the wound, increasing pain you need to be seen immediately.

## 2023-11-09 ENCOUNTER — Other Ambulatory Visit (HOSPITAL_BASED_OUTPATIENT_CLINIC_OR_DEPARTMENT_OTHER): Payer: Self-pay | Admitting: Cardiovascular Disease

## 2023-11-13 DIAGNOSIS — J42 Unspecified chronic bronchitis: Secondary | ICD-10-CM | POA: Diagnosis not present

## 2023-11-13 DIAGNOSIS — J449 Chronic obstructive pulmonary disease, unspecified: Secondary | ICD-10-CM | POA: Diagnosis not present

## 2023-11-17 ENCOUNTER — Ambulatory Visit (HOSPITAL_BASED_OUTPATIENT_CLINIC_OR_DEPARTMENT_OTHER): Payer: Medicare HMO | Admitting: Family

## 2023-11-17 ENCOUNTER — Telehealth (HOSPITAL_BASED_OUTPATIENT_CLINIC_OR_DEPARTMENT_OTHER): Payer: Self-pay

## 2023-11-17 NOTE — Telephone Encounter (Signed)
 Rescheduled

## 2023-11-23 ENCOUNTER — Encounter (HOSPITAL_BASED_OUTPATIENT_CLINIC_OR_DEPARTMENT_OTHER): Payer: Self-pay | Admitting: Family

## 2023-11-23 ENCOUNTER — Ambulatory Visit (HOSPITAL_BASED_OUTPATIENT_CLINIC_OR_DEPARTMENT_OTHER): Admitting: Family

## 2023-11-23 VITALS — BP 128/77 | HR 68 | Ht 63.0 in | Wt 327.0 lb

## 2023-11-23 DIAGNOSIS — E1169 Type 2 diabetes mellitus with other specified complication: Secondary | ICD-10-CM

## 2023-11-23 DIAGNOSIS — I1A0 Resistant hypertension: Secondary | ICD-10-CM | POA: Diagnosis not present

## 2023-11-23 DIAGNOSIS — E782 Mixed hyperlipidemia: Secondary | ICD-10-CM

## 2023-11-23 DIAGNOSIS — G4733 Obstructive sleep apnea (adult) (pediatric): Secondary | ICD-10-CM | POA: Diagnosis not present

## 2023-11-23 MED ORDER — MOUNJARO 10 MG/0.5ML ~~LOC~~ SOAJ: 10.0000 mg | SUBCUTANEOUS | 0 refills | Status: DC

## 2023-11-23 MED ORDER — MOUNJARO 15 MG/0.5ML ~~LOC~~ SOAJ: 15.0000 mg | SUBCUTANEOUS | 3 refills | Status: AC

## 2023-11-23 MED ORDER — MOUNJARO 12.5 MG/0.5ML ~~LOC~~ SOAJ
12.5000 mg | SUBCUTANEOUS | 0 refills | Status: AC
Start: 2023-11-23 — End: ?

## 2023-11-23 NOTE — Progress Notes (Signed)
 Cardiology Office Note:  .   Date:  11/23/2023  ID:  Katelyn Howard, DOB 04-21-1961, MRN [id number] PCP: Rodrigo Ran, MD  [facility] Providers Cardiologist:  Chilton Si, MD    History of Present Illness: .   Katelyn Howard is a 63 y.o. female with hx of OSA on CPAP, asthma, obesity, diabetes, lymphemea.   Established with Dr. Duke [facility]via [facility] 08/2021 with increased LE edema, orthopnea, PND despite Lasix. Hydrochlorothiazide stopped and Lasix increased. Valsartan initiated and referred to PREP exercise program. Renal dopplers normal. Echo 09/06/21 LVEF 60-65%, gr1dd. Did not tolerate valsartan with diarrhea and was transitioned back to losartan-hydrochlorothiazide and Amlodipine increased. Vascular recommended compression stockings for LE edema but she did not tolerate.   Seen 01/20/23 by Dr. Duke Salvia with improvement in her dyspnea after starting a new inhaler though still noted significant LE edema. BP was controlled and Amlodipine, Clonidine, Nebivolol, Valsartan were continued. Her Mounjaro was increased to 12.5mg  daily to facilitate weight loss. She was referred to  lymphedema clinic for consideration of Unna boot and lymphedema pump.   Most recent visit 05/2023 with increased asthma symptoms, she was given course of azithromycin and referred to pulmonology. BP was controlled.  Urgent care evaluation 06/11/23, 08/31/23, and 09/02/23 for right chest wall abscess.  Presents today for follow up. She has not been checking BP at home. She has been doing a few chair exercises and is not able to walk as much d/t lymphedema. She feels the swelling in lower extremities same from the previous visit. She has been experiancing SOB for months now and she feels that it is related to her asthma. She has been taking the Mounjuro 7.5 g and feels that the weight lose has become stagnant. Interested in increasing dose. Reports no chest pain, pressure, or tightness. No  edema, orthopnea, PND. Reports no palpitations.   Prior antihypertensives Losartan  ROS: Please see the history of present illness.    All other systems reviewed and are negative.   Studies Reviewed: .        Cardiac Studies & Procedures   ______________________________________________________________________________________________     ECHOCARDIOGRAM  ECHOCARDIOGRAM COMPLETE 09/12/2021  Narrative ECHOCARDIOGRAM REPORT    Patient Name:   Katelyn Howard Date of Exam: 09/12/2021 Medical Rec #:  [id number]           Height:       63.0 in Accession #:    [id number]          Weight:       333.7 lb Date of Birth:  [birth year]           BSA:          2.404 m Patient Age:    60 years            BP:           146/80 mmHg Patient Gender: F                   HR:           70 bpm. Exam Location:  Outpatient  Procedure: 2D Echo, Cardiac Doppler, Color Doppler and Strain Analysis  Indications:    R06.02 SOB; R60.0 Lower extremity edema  History:        Patient has prior history of Echocardiogram examinations, most recent 06/23/2016. CHF, Signs/Symptoms:Shortness of Breath and Edema; Risk Factors:Hypertension, Sleep Apnea, Dyslipidemia, Diabetes and Non-Smoker. Patient denies chest pain but she is having SOB  with bilateral leg edema for about 1 month. She contracted CoVid-19 virus 02/2021.  Sonographer:    Carlos American RVT, RDCS (AE), RDMS Referring Phys: [phone] [facility] [place]   Sonographer Comments: Patient is morbidly obese. Image acquisition challenging due to respiratory motion. IMPRESSIONS   1. Left ventricular ejection fraction, by estimation, is 60 to 65%. The left ventricle has normal function. The left ventricle has no regional wall motion abnormalities. Left ventricular diastolic parameters are consistent with Grade I diastolic dysfunction (impaired relaxation). 2. Right ventricular systolic function is normal. The right ventricular size is normal. 3. The mitral  valve is normal in structure. No evidence of mitral valve regurgitation. No evidence of mitral stenosis. 4. The aortic valve is tricuspid. Aortic valve regurgitation is not visualized. No aortic stenosis is present. 5. The inferior vena cava is normal in size with greater than 50% respiratory variability, suggesting right atrial pressure of 3 mmHg.  Comparison(s): Prior images unable to be directly viewed, comparison made by report only. EF 55%.  FINDINGS Left Ventricle: Left ventricular ejection fraction, by estimation, is 60 to 65%. The left ventricle has normal function. The left ventricle has no regional wall motion abnormalities. The left ventricular internal cavity size was normal in size. There is no left ventricular hypertrophy. Left ventricular diastolic parameters are consistent with Grade I diastolic dysfunction (impaired relaxation).  Right Ventricle: The right ventricular size is normal. Right ventricular systolic function is normal.  Left Atrium: Left atrial size was normal in size.  Right Atrium: Right atrial size was normal in size.  Pericardium: There is no evidence of pericardial effusion.  Mitral Valve: The mitral valve is normal in structure. No evidence of mitral valve regurgitation. No evidence of mitral valve stenosis.  Tricuspid Valve: The tricuspid valve is normal in structure. Tricuspid valve regurgitation is not demonstrated. No evidence of tricuspid stenosis.  Aortic Valve: The aortic valve is tricuspid. Aortic valve regurgitation is not visualized. No aortic stenosis is present. Aortic valve mean gradient measures 5.5 mmHg. Aortic valve peak gradient measures 11.1 mmHg.  Pulmonic Valve: The pulmonic valve was normal in structure. Pulmonic valve regurgitation is not visualized. No evidence of pulmonic stenosis.  Aorta: The aortic root is normal in size and structure.  Venous: The inferior vena cava is normal in size with greater than 50% respiratory  variability, suggesting right atrial pressure of 3 mmHg.  IAS/Shunts: The interatrial septum is aneurysmal. No atrial level shunt detected by color flow Doppler.   LEFT VENTRICLE PLAX 2D LVIDd:         5.13 cm     Diastology LVIDs:         3.33 cm     LV e' medial:    6.64 cm/s LV PW:         1.01 cm     LV E/e' medial:  14.6 LV IVS:        0.73 cm     LV e' lateral:   6.09 cm/s LVOT diam:     2.00 cm     LV E/e' lateral: 15.9 LV SV:         67 LV SV Index:   28 LVOT Area:     3.14 cm  3D Volume EF: LV Volumes (MOD)           3D EF:        67 % LV vol d, MOD A2C: 92.9 ml LV EDV:       96 ml LV vol  d, MOD A4C: 73.8 ml LV ESV:       32 ml LV vol s, MOD A2C: 31.0 ml LV SV:        64 ml LV vol s, MOD A4C: 22.2 ml LV SV MOD A2C:     61.9 ml LV SV MOD A4C:     73.8 ml LV SV MOD BP:      59.4 ml  RIGHT VENTRICLE RV S prime:     15.90 cm/s TAPSE (M-mode): 2.9 cm  LEFT ATRIUM             Index        RIGHT ATRIUM           Index LA diam:        3.70 cm 1.54 cm/m   RA Area:     18.20 cm LA Vol (A2C):   71.2 ml 29.62 ml/m  RA Volume:   53.50 ml  22.26 ml/m LA Vol (A4C):   53.5 ml 22.26 ml/m LA Biplane Vol: 62.1 ml 25.84 ml/m AORTIC VALVE                     PULMONIC VALVE AV Area (Vmax):    1.78 cm      PV Vmax:       1.12 m/s AV Area (Vmean):   1.65 cm      PV Peak grad:  5.0 mmHg AV Area (VTI):     1.81 cm AV Vmax:           166.50 cm/s AV Vmean:          111.500 cm/s AV VTI:            0.370 m AV Peak Grad:      11.1 mmHg AV Mean Grad:      5.5 mmHg LVOT Vmax:         94.20 cm/s LVOT Vmean:        58.400 cm/s LVOT VTI:          0.213 m LVOT/AV VTI ratio: 0.58  AORTA Ao Root diam: 2.90 cm Ao Asc diam:  2.90 cm Ao Arch diam: 2.6 cm  MITRAL VALVE MV Area (PHT): 3.51 cm     SHUNTS MV Decel Time: 216 msec     Systemic VTI:  0.21 m MV E velocity: 96.90 cm/s   Systemic Diam: 2.00 cm MV A velocity: 111.00 cm/s MV E/A ratio:  0.87  Olga Millers  MD Electronically signed by Olga Millers MD Signature Date/Time: 09/12/2021/9:44:10 AM    Final          ______________________________________________________________________________________________      Risk Assessment/Calculations:             Physical Exam:   VS:  BP 128/77 (BP Location: Other (Comment), Patient Position: Sitting) Comment (BP Location): forearm Comment (Cuff Size): thigh cuff  Pulse 68   Ht 5\' 3"  (1.6 m)   Wt (!) 327 lb (148.3 kg)   BMI 57.93 kg/m    Wt Readings from Last 3 Encounters:  11/23/23 (!) 327 lb (148.3 kg)  08/31/23 (!) 334 lb (151.5 kg)  07/30/23 (!) 334 lb (151.5 kg)    Today's Vitals   11/23/23 1446 11/23/23 1545  BP: (!) 140/62 128/77  Pulse: 68   Weight: (!) 327 lb (148.3 kg)   Height: 5\' 3"  (1.6 m)    Body mass index is 57.93 kg/m.  GEN: Well nourished, well developed in no acute  distress NECK: No JVD; No carotid bruits CARDIAC: RRR, no murmurs, rubs, gallops RESPIRATORY:  Clear to auscultation without rales, wheezing or rhonchi  ABDOMEN: Soft, non-tender, non-distended EXTREMITIES:  Bilateral lower extremity lymphedema R>L; No deformity   ASSESSMENT AND PLAN: .    Resistant hypertension - She is not taking her BP at home. 09/02/23 BP in Urgent care was 129/73.  Recheck in office was 128/77. Remain on current regiman of Clonidine 0.1 mg BID, amlodipine 10 mg daily, Valsartan 320 mg daily, and nebivolol 10 mg daily. Discussed to monitor BP at home at least 2 hours after medications and sitting for 5-10 minutes.   OSA - Followed by Dr. Vickey Huger. CPAP compliance encouraged.   HLD - Lipid panel 06/08/23 Total cholesterol: 167, Triglycerides: 93, HDL: 58, and LDL: 92 Continue atorvastatin 80 mg.   Obesity - Discussed referral to PREP and nutrition. She is not interested at this time but will consider in the future. Referral placed for Health coach Amy, and increased Mounjaro dose: 10mg  weekly x 4 weeks ? 12.5mg  weekly x 4 weeks  ? 15mg  weekly. Recommend aiming for 150 minutes of moderate intensity activity per week and following a heart healthy diet.   Lymphedema - She was unable to get in touch with lymph edema clinic. Will follow up on status of referral. Leg elevation encouraged.         Dispo: Follow up in 6 months.   Signed, Alver Sorrow, NP

## 2023-11-23 NOTE — Patient Instructions (Signed)
 Medication Instructions:  Your physician has recommended you make the following change in your medication:   Start: Mounjaro 10mg  weekly for one month, 12.5mg  weekly for one month and then 15mg  weekly    Follow-Up: At [facility], you and your health needs are our priority.  As part of our continuing mission to provide you with exceptional heart care, our providers are all part of one team.  This team includes your primary Cardiologist (physician) and Advanced Practice Providers or APPs (Physician Assistants and Nurse Practitioners) who all work together to provide you with the care you need, when you need it.  Your next appointment:   6 months with Dr. Duke Salvia or Gillian Shields, NP

## 2023-11-25 ENCOUNTER — Telehealth: Payer: Self-pay

## 2023-11-25 DIAGNOSIS — Z0000 Encounter for general adult medical examination without abnormal findings: Secondary | ICD-10-CM

## 2023-11-25 NOTE — Telephone Encounter (Signed)
 Called patient to discuss health coaching per referral from Gillian Shields, NP, to discuss setting health goals with patient. Patient is interested in health coaching and asked for a call at another time due to getting prepared for another appointment. Patient shared she would be available after 2:00pm tomorrow for a telephone appointment to discuss health goals. Patient has been scheduled accordingly and will be called at 2:45pm.   Renaee Munda, MS, ERHD, [facility]  Care Guide [facility] Care Navigation Telephone: [phone] Email: [name]

## 2023-11-26 ENCOUNTER — Telehealth (HOSPITAL_BASED_OUTPATIENT_CLINIC_OR_DEPARTMENT_OTHER): Payer: Self-pay | Admitting: *Deleted

## 2023-11-26 ENCOUNTER — Ambulatory Visit: Payer: Self-pay | Attending: Cardiovascular Disease

## 2023-11-26 DIAGNOSIS — Z0000 Encounter for general adult medical examination without abnormal findings: Secondary | ICD-10-CM

## 2023-11-26 DIAGNOSIS — I890 Lymphedema, not elsewhere classified: Secondary | ICD-10-CM

## 2023-11-26 NOTE — Progress Notes (Signed)
 HEALTH & WELLNESS COACHING INITIAL INTAKE   Appointment Outcome: Completed, Session #: Initial Start time: 2:51pm   End time: 3:46pm   Total Mins: 55 minutes Patient's chart was being accessed at another location at time of appointment and had to wait to gain access to check patient in after 2:45pm.   What are the Patient's goals from Coaching? Patient wants to resume her healthy eating habits and stay motivated to engage in her chair exercise routine regularly.   Why did they seek coaching now? Patient has been trying to sustain healthy behaviors but is having a challenge with being consistent and her eating habits has changed due to snacking throughout the day because her husband prepares meals, and he works 7 days a week.    Readiness - What stage is the patient in regarding their goal(s)?   Patient is in the action stage of engaging in chair exercises and is in the preparation stage of developing healthy eating habits.      Coaching Progress Notes: Reviewed with patient that she is currently taking 10 mg of Mounjaro and will increase her dosage over the next 4 weeks. Patient was taking 7.5 mg of the medication and felt that her weight lost hit a plateau. Patient shared that she is currently engaging chair exercises that she learned from Physical Therapy at least 2-3 times per week, but the number of days varies based on her inability to move related to arthritis. Patient also motivates herself to get out of bed on days she doesn't feel like it because they have a dog to take care of.  Patient mentioned that she is snacking more than eating 3 meals per day. Patient shared that her go to snacks are meat skins, ice cream, chips, and cookies. Patient reported eating grits and fruit, or oatmeal and fruit for breakfast prepared by her husband before going to work. Patient mentioned that she was drinking water flavored by sugar free drink packs, eating yogurt, and nuts regularly, but is not  sure why she got out of the habit of doing so. Patient reported finding herself snacking about every 2 hours.   Patient expressed her concerns about her health habits and what the possible consequences are based on her family health history. Patient shared that she is scared that if she does not make changes and stick with them that she will have the same outcome as other family members. Patient stated that she has to do better and has prayed about being able to make these healthy changes in her behavior.     Coaching Outcomes Discussed with patient Alver Sorrow recommended assessment and plan to incorporate a heart healthy diet and engage in 150 minutes of moderate intensity exercise weekly.   Discussed with patient what she would like to start on to work towards her health goals over the next two weeks. Patient wants to work on improving her food choices throughout the day while her husband is working that are easy to sustain with not having to do a lot of walking/standing.   Patient wants to maintain her current level of engagement in chair exercises until she feels ready to increase her physical activity.      AGREEMENTS SECTION     Patient review of Health Coaching Agreement Reviewed Coaching Agreement and Code of Ethics with Patient during initial session. Answered any questions the patient had if any regarding the Coaching Agreement and Code of Ethics. Patient verbally agreed to adhere to the Coaching Agreement  and to abide by the Code of Ethics.  Mailed patient with a hard/electronic copy of the Coaching Agreement and Code of Ethics.    Overall Goal(s): Develop healthy eating habits to reduce snacking throughout the day to improve management of diabetes over the next 3 months. Increase engagement in chair exercises to increase intensity and duration of exercise to moderate intensity of 150 minutes per week over the next 3 months.    Agreement/Action Steps:  Develop  healthy eating habits Drink 1-2 (16.9 oz.) water bottles per day with sugar free drink packs Choose healthy snacks throughout the day that are easy to prepare or prepared nightly in snack bags Include 4-5 servings each of fruit and raw vegetables (e.g. cucumbers, celery, carrots) Sugar free yogurt Nuts (e.g., pecans and walnuts)  Increase exercise to moderate intensity of 150 minutes weekly Continue to engage in current exercise routine until lymphedema is address by another provider     Referrals: N/A  Resources: Will follow up with patient within a week to determine if she received the information in the mail relative to the recommendations that were provided by [facility] for review to determine what other strategies she wants to incorporate to improve her healthy eating habits and engagement in exercise.

## 2023-11-26 NOTE — Telephone Encounter (Signed)
 Spoke with Lymphedema clinic in [facility] They reached out to patient 3 times, unable to reach patient Have refaxed referral  Called and advised patient Gave number to follow up if she does not hear from them in 2 weeks [phone]

## 2023-11-26 NOTE — Progress Notes (Signed)
See phone note 4/10 

## 2023-12-01 ENCOUNTER — Telehealth: Payer: Self-pay | Admitting: Licensed Clinical Social Worker

## 2023-12-01 NOTE — Telephone Encounter (Signed)
 CSW contacted patient to inform that Katelyn Howard is no longer available to provide Health Coaching. CSW provided resources for healthy eating and contact information if further needs arise. Aubry Blase, LCSW, CCSW-MCS [phone]

## 2023-12-03 ENCOUNTER — Ambulatory Visit: Payer: Self-pay

## 2023-12-07 DIAGNOSIS — J449 Chronic obstructive pulmonary disease, unspecified: Secondary | ICD-10-CM | POA: Diagnosis not present

## 2023-12-07 DIAGNOSIS — J42 Unspecified chronic bronchitis: Secondary | ICD-10-CM | POA: Diagnosis not present

## 2023-12-10 DIAGNOSIS — I890 Lymphedema, not elsewhere classified: Secondary | ICD-10-CM | POA: Diagnosis not present

## 2023-12-16 DIAGNOSIS — E785 Hyperlipidemia, unspecified: Secondary | ICD-10-CM | POA: Diagnosis not present

## 2023-12-16 DIAGNOSIS — I509 Heart failure, unspecified: Secondary | ICD-10-CM | POA: Diagnosis not present

## 2023-12-16 DIAGNOSIS — I110 Hypertensive heart disease with heart failure: Secondary | ICD-10-CM | POA: Diagnosis not present

## 2023-12-16 DIAGNOSIS — D649 Anemia, unspecified: Secondary | ICD-10-CM | POA: Diagnosis not present

## 2023-12-16 DIAGNOSIS — E7849 Other hyperlipidemia: Secondary | ICD-10-CM | POA: Diagnosis not present

## 2023-12-16 DIAGNOSIS — Z794 Long term (current) use of insulin: Secondary | ICD-10-CM | POA: Diagnosis not present

## 2023-12-16 DIAGNOSIS — K219 Gastro-esophageal reflux disease without esophagitis: Secondary | ICD-10-CM | POA: Diagnosis not present

## 2023-12-16 DIAGNOSIS — Z1212 Encounter for screening for malignant neoplasm of rectum: Secondary | ICD-10-CM | POA: Diagnosis not present

## 2023-12-16 DIAGNOSIS — E1169 Type 2 diabetes mellitus with other specified complication: Secondary | ICD-10-CM | POA: Diagnosis not present

## 2023-12-21 DIAGNOSIS — I890 Lymphedema, not elsewhere classified: Secondary | ICD-10-CM | POA: Diagnosis not present

## 2023-12-22 DIAGNOSIS — E785 Hyperlipidemia, unspecified: Secondary | ICD-10-CM | POA: Diagnosis not present

## 2023-12-22 DIAGNOSIS — E11621 Type 2 diabetes mellitus with foot ulcer: Secondary | ICD-10-CM | POA: Diagnosis not present

## 2023-12-22 DIAGNOSIS — I110 Hypertensive heart disease with heart failure: Secondary | ICD-10-CM | POA: Diagnosis not present

## 2023-12-22 DIAGNOSIS — R82998 Other abnormal findings in urine: Secondary | ICD-10-CM | POA: Diagnosis not present

## 2023-12-22 DIAGNOSIS — Z1331 Encounter for screening for depression: Secondary | ICD-10-CM | POA: Diagnosis not present

## 2023-12-22 DIAGNOSIS — I87331 Chronic venous hypertension (idiopathic) with ulcer and inflammation of right lower extremity: Secondary | ICD-10-CM | POA: Diagnosis not present

## 2023-12-22 DIAGNOSIS — I890 Lymphedema, not elsewhere classified: Secondary | ICD-10-CM | POA: Diagnosis not present

## 2023-12-22 DIAGNOSIS — Z794 Long term (current) use of insulin: Secondary | ICD-10-CM | POA: Diagnosis not present

## 2023-12-22 DIAGNOSIS — M069 Rheumatoid arthritis, unspecified: Secondary | ICD-10-CM | POA: Diagnosis not present

## 2023-12-22 DIAGNOSIS — Z0000 Encounter for general adult medical examination without abnormal findings: Secondary | ICD-10-CM | POA: Diagnosis not present

## 2023-12-22 DIAGNOSIS — I509 Heart failure, unspecified: Secondary | ICD-10-CM | POA: Diagnosis not present

## 2023-12-24 DIAGNOSIS — I890 Lymphedema, not elsewhere classified: Secondary | ICD-10-CM | POA: Diagnosis not present

## 2023-12-24 DIAGNOSIS — R609 Edema, unspecified: Secondary | ICD-10-CM | POA: Diagnosis not present

## 2023-12-24 DIAGNOSIS — M25512 Pain in left shoulder: Secondary | ICD-10-CM | POA: Diagnosis not present

## 2023-12-24 DIAGNOSIS — I87331 Chronic venous hypertension (idiopathic) with ulcer and inflammation of right lower extremity: Secondary | ICD-10-CM | POA: Diagnosis not present

## 2023-12-25 DIAGNOSIS — I890 Lymphedema, not elsewhere classified: Secondary | ICD-10-CM | POA: Diagnosis not present

## 2023-12-31 DIAGNOSIS — R609 Edema, unspecified: Secondary | ICD-10-CM | POA: Diagnosis not present

## 2023-12-31 DIAGNOSIS — I87331 Chronic venous hypertension (idiopathic) with ulcer and inflammation of right lower extremity: Secondary | ICD-10-CM | POA: Diagnosis not present

## 2023-12-31 DIAGNOSIS — I890 Lymphedema, not elsewhere classified: Secondary | ICD-10-CM | POA: Diagnosis not present

## 2024-01-04 DIAGNOSIS — I890 Lymphedema, not elsewhere classified: Secondary | ICD-10-CM | POA: Diagnosis not present

## 2024-01-05 DIAGNOSIS — M19012 Primary osteoarthritis, left shoulder: Secondary | ICD-10-CM | POA: Diagnosis not present

## 2024-01-19 ENCOUNTER — Ambulatory Visit (HOSPITAL_BASED_OUTPATIENT_CLINIC_OR_DEPARTMENT_OTHER): Admitting: Pulmonary Disease

## 2024-01-21 LAB — COLOGUARD

## 2024-01-24 NOTE — Progress Notes (Unsigned)
 Katelyn Howard, female    DOB: [birth year]   MRN: [id number]   Brief patient profile:  [id number]  never smoker healthy child  referred to pulmonary clinic 01/26/2024 by Maudine Sos for doe related to MO vs asthma       Pt not previously seen by [facility] service.    Wt  p 2nd baby around 89 / onset in 53s of sinus problem leading to recurrent cough/ congestion and dx of AB    History of Present Illness  01/26/2024  Pulmonary/ 1st office eval/Eliot Popper  Chief Complaint  Patient presents with   Follow-up    Asthma consult   Dyspnea:  5 years since did groceries s scooter/ gets let off at door due to back and leg pain with dx of RA  Cough: none at present, just reproducible doe  Sleep: flat bed/ 2 pillows and cpap x 5-6 years s noct or early am  resp cc   SABA use: 2 x daily hfa never pre or rechallenges and has neb sev gimes a wek  02 MVH:QION     No obvious day to day or daytime pattern/variability or assoc excess/ purulent sputum or mucus plugs or hemoptysis or cp or chest tightness, subjective wheeze or overt sinus or hb symptoms.    Also denies any obvious fluctuation of symptoms with weather or environmental changes or other aggravating or alleviating factors except as outlined above   No unusual exposure hx or h/o childhood pna/ asthma or knowledge of premature birth.  Current Allergies, Complete Past Medical History, Past Surgical History, Family History, and Social History were reviewed in [employer] record.  ROS  The following are not active complaints unless bolded Hoarseness, sore throat, dysphagia, dental problems, itching, sneezing,  nasal congestion or discharge of excess mucus or purulent secretions, ear ache,   fever, chills, sweats, unintended wt loss or wt gain, classically pleuritic or exertional cp,  orthopnea pnd or arm/hand swelling  or leg swelling, presyncope, palpitations, abdominal pain, anorexia, nausea, vomiting, diarrhea  or change in  bowel habits or change in bladder habits, change in stools or change in urine, dysuria, hematuria,  rash, arthralgias, visual complaints, headache, numbness, weakness or ataxia or problems with walking or coordination,  change in mood or  memory.             Outpatient Medications Prior to Visit  Medication Sig Dispense Refill   albuterol  (PROVENTIL  HFA;VENTOLIN  HFA) 108 (90 BASE) MCG/ACT inhaler Inhale 2 puffs into the lungs every 6 (six) hours as needed. For wheezing     amLODipine  (NORVASC ) 10 MG tablet Take 1 tablet by mouth once daily 90 tablet 3   ARNUITY ELLIPTA 100 MCG/ACT AEPB Take 1 puff by mouth daily.     aspirin EC 81 MG tablet Take 81 mg by mouth daily.     atorvastatin (LIPITOR) 80 MG tablet Take 80 mg by mouth every morning.      Cholecalciferol 50000 units capsule Take 50,000 Units by mouth every Thursday.      cloNIDine  (CATAPRES ) 0.1 MG tablet Take 1 tablet (0.1 mg total) by mouth 2 (two) times daily. 60 tablet 11   clotrimazole (LOTRIMIN) 1 % cream Apply 1 application topically daily as needed (for rash).     cyclobenzaprine (FLEXERIL) 10 MG tablet TAKE 1 TABLET BY MOUTH TWICE DAILY AS NEEDED FOR MUSCLE PAIN OR SPASMS     esomeprazole (NEXIUM) 40 MG capsule Take 40 mg by mouth 2 (two)  times daily before a meal.      fluticasone  (CUTIVATE ) 0.05 % cream Apply topically 2 (two) times daily. 30 g 1   fluticasone  (FLONASE ) 50 MCG/ACT nasal spray Place 1 spray into both nostrils 2 (two) times daily.     folic acid (FOLVITE) 400 MCG tablet Take 400 mcg by mouth daily.     furosemide  (LASIX ) 40 MG tablet Take 1 tablet by mouth twice daily 180 tablet 3   glimepiride (AMARYL) 4 MG tablet Take 4 mg by mouth daily with breakfast.      hydrOXYzine  (ATARAX /VISTARIL ) 25 MG tablet Take 1 tablet (25 mg total) by mouth every 6 (six) hours. Prn itching 20 tablet 1   insulin degludec (TRESIBA FLEXTOUCH) 200 UNIT/ML FlexTouch Pen INJECT 80 UNITS SUBCUTANEOUSLY IN THE MORNING AND 80 IN THE  EVENING INCREASE AS DIRECTED PER CLINIC     methocarbamol (ROBAXIN) 500 MG tablet Take 1 tablet by mouth daily.     mupirocin ointment (BACTROBAN) 2 % Apply 1 application. topically 2 (two) times daily as needed.     nebivolol (BYSTOLIC) 10 MG tablet Take 10 mg by mouth daily.     nystatin cream (MYCOSTATIN) Apply 1 application. topically 2 (two) times daily as needed.     oxyCODONE  (OXY IR/ROXICODONE ) 5 MG immediate release tablet Take 5 mg by mouth every 6 (six) hours as needed for moderate pain, severe pain or breakthrough pain.     oxyCODONE -acetaminophen  (PERCOCET/ROXICET) 5-325 MG tablet Take 1 tablet by mouth every 4 (four) hours as needed for severe pain.     potassium chloride (K-DUR) 10 MEQ tablet Take 10 mEq by mouth daily.      potassium chloride (KLOR-CON M) 10 MEQ tablet Take 10 mEq by mouth daily.     tirzepatide  (MOUNJARO ) 10 MG/0.5ML Pen Inject 10 mg into the skin once a week. 2 mL 0   tirzepatide  (MOUNJARO ) 12.5 MG/0.5ML Pen Inject 12.5 mg into the skin once a week. 2 mL 0   tirzepatide  (MOUNJARO ) 15 MG/0.5ML Pen Inject 15 mg into the skin once a week. 2 mL 3   triamcinolone  cream (KENALOG ) 0.1 % Apply 1 application. topically 2 (two) times daily as needed.     valsartan  (DIOVAN ) 320 MG tablet Take 1 tablet by mouth once daily 90 tablet 0   No facility-administered medications prior to visit.    Past Medical History:  Diagnosis Date   (HFpEF) heart failure with preserved ejection fraction (HCC) 09/05/2021   Allergy    Arthritis    Asthma    CHF (congestive heart failure) (HCC) 2008   Diabetes mellitus    Edema extremities    GERD (gastroesophageal reflux disease)    Heart failure, type unknown (HCC) 09/05/2021   Hyperlipidemia    Hypertension    Lymphedema 01/20/2023   Neuromuscular disorder (HCC)    neuropathy   Obesity    OSA (obstructive sleep apnea) 10/13/2016   Severe with AHI 44.9/hr now on CPAP at 11cm H2O   Sleep apnea    wears cpap        Objective:     BP 123/82 (BP Location: Left Arm, Patient Position: Sitting, Cuff Size: Large)   Pulse (!) 59   Temp (!) 97.3 F (36.3 C) (Temporal)   Ht 5\' 3"  (1.6 m)   Wt (!) 312 lb 12.8 oz (141.9 kg)   SpO2 99% Comment: RA  BMI 55.41 kg/m   SpO2: 99 % (RA) RA   Pleasant amb MO bf  walks with 4 pronged walker   HEENT : Oropharynx  looks ok  but poorly viz with M4 airway    NECK :  without  apparent JVD/ palpable Nodes/TM    LUNGS: no acc muscle use,  Nl contour chest which is clear to A and P bilaterally without cough on insp or exp maneuvers   CV:  RRR  no s3 or murmur or increase in P2, and 2+ pitting both LEs  ABD:  massively obese soft and nontender   MS:     ext warm without deformities Or obvious joint restrictions  calf tenderness, cyanosis or clubbing    SKIN: warm and dry without lesions    NEURO:  alert, approp, nl sensorium with  no motor or cerebellar deficits apparent.       Assessment   Mild persistent asthma Onset in her 37s with baseline wt 200 around age 62 p 2nd baby - 01/26/2024  After extensive coaching inhaler device,  effectiveness =    near 0 on both hfa/ dpi  - Allergy screen 01/26/2024 >  Eos 0. /  IgE    Her asthma must be very mild to have such poor technique but then she told me she also has neb just not sure what she put in it.  If she truly has chronic asthma needs ICS and best way to be sure she gets it is in combination with bronchodilator, first saba andthen laba if she continues to have symptoms attributable to asthma which I told her need to be distingished from chronic predictable DOE which is also proportionate to activity whereas asthma is not  Rec: ABC Action plan reviewed - see avs for instructions unique to this ov  Pfts on return with all meds in hand using a trust but verify approach to confirm accurate Medication  Reconciliation The principal here is that until we are certain that the  patients are doing what  we've asked, it makes no sense to ask them to do more.    Morbid (severe) obesity due to excess calories (HCC) Body mass index is 55.41 kg/m.  -  trending up  Lab Results  Component Value Date   TSH 3.354 11/29/2019      Contributing to doe and risk of GERD/dvt/PE >>>   reviewed the need and the process to achieve and maintain neg calorie balance > defer f/u primary care including intermittently monitoring thyroid  status      Each maintenance medication was reviewed in detail including emphasizing most importantly the difference between maintenance and prns and under what circumstances the prns are to be triggered using an action plan format where appropriate.  Total time for H and P, chart review, counseling, reviewing hfa/dpi/ neb  device(s) and generating customized AVS unique to this office visit / same day charting = 48 min new pt eval for chronic  refractory respiratory  symptoms of uncertain etiology          Vernestine Gondola, MD 01/26/2024

## 2024-01-26 ENCOUNTER — Ambulatory Visit: Admitting: Internal Medicine

## 2024-01-26 ENCOUNTER — Encounter: Payer: Self-pay | Admitting: Internal Medicine

## 2024-01-26 VITALS — BP 123/82 | HR 59 | Temp 97.3°F | Ht 63.0 in | Wt 312.8 lb

## 2024-01-26 DIAGNOSIS — J449 Chronic obstructive pulmonary disease, unspecified: Secondary | ICD-10-CM | POA: Diagnosis not present

## 2024-01-26 DIAGNOSIS — J4530 Mild persistent asthma, uncomplicated: Secondary | ICD-10-CM | POA: Diagnosis not present

## 2024-01-26 DIAGNOSIS — Z6841 Body Mass Index (BMI) 40.0 and over, adult: Secondary | ICD-10-CM | POA: Diagnosis not present

## 2024-01-26 LAB — CBC WITH DIFFERENTIAL/PLATELET
Basophils Absolute: 0 10*3/uL (ref 0.0–0.1)
Basophils Relative: 0.4 % (ref 0.0–3.0)
Eosinophils Absolute: 0.1 10*3/uL (ref 0.0–0.7)
Eosinophils Relative: 1.5 % (ref 0.0–5.0)
HCT: 38.8 % (ref 36.0–46.0)
Hemoglobin: 12.1 g/dL (ref 12.0–15.0)
Lymphocytes Relative: 27 % (ref 12.0–46.0)
Lymphs Abs: 2.3 10*3/uL (ref 0.7–4.0)
MCHC: 31.2 g/dL (ref 30.0–36.0)
MCV: 79.2 fl (ref 78.0–100.0)
Monocytes Absolute: 0.5 10*3/uL (ref 0.1–1.0)
Monocytes Relative: 5.3 % (ref 3.0–12.0)
Neutro Abs: 5.6 10*3/uL (ref 1.4–7.7)
Neutrophils Relative %: 65.8 % (ref 43.0–77.0)
Platelets: 261 10*3/uL (ref 150.0–400.0)
RBC: 4.9 Mil/uL (ref 3.87–5.11)
RDW: 15.7 % — ABNORMAL HIGH (ref 11.5–15.5)
WBC: 8.4 10*3/uL (ref 4.0–10.5)

## 2024-01-26 LAB — BASIC METABOLIC PANEL WITH GFR
BUN: 13 mg/dL (ref 6–23)
CO2: 31 meq/L (ref 19–32)
Calcium: 9.3 mg/dL (ref 8.4–10.5)
Chloride: 98 meq/L (ref 96–112)
Creatinine, Ser: 0.73 mg/dL (ref 0.40–1.20)
GFR: 87.81 mL/min (ref >60.00)
Glucose, Bld: 50 mg/dL — ABNORMAL LOW (ref 70–99)
Potassium: 3.9 meq/L (ref 3.5–5.1)
Sodium: 137 meq/L (ref 135–145)

## 2024-01-26 MED ORDER — BREZTRI AEROSPHERE 160-9-4.8 MCG/ACT IN AERO: 2.0000 | INHALATION_SPRAY | Freq: Two times a day (BID) | RESPIRATORY_TRACT | Status: AC

## 2024-01-26 NOTE — Patient Instructions (Addendum)
 Plan A = Automatic = Always=    Arnuity one arunity    Plan B = Backup (to supplement plan A, not to replace it) Only use your albuterol  inhaler as a rescue medication to be used if you can't catch your breath by resting or doing a relaxed purse lip breathing pattern.  - The less you use it, the better it will work when you need it. - Ok to use the inhaler up to 2 puffs  every 4 hours if you must but call for appointment if use goes up over your usual need - Don't leave home without it !!  (think of it like the spare tire for your car)    Plan C = Crisis (instead of Plan B but only if Plan B stops working) - only use your albuterol  nebulizer if you first try Plan B and it fails to help > ok to use the nebulizer up to every 4 hours but if start needing it regularly call for immediate appointment  Also  Ok to try albuterol  15 min before an activity (on alternating days)  that you know would usually make you short of breath and see if it makes any difference and if makes none then don't take albuterol  after activity unless you can't catch your breath as this means it's the resting that helps, not the albuterol .       Allergy screen 01/26/2024 >  Eos 0. /  IgE     Please schedule a follow up visit in 3 months but call sooner if needed  with all medications /inhalers/ solutions in hand so we can verify exactly what you are taking. This includes all medications from all doctors and over the counters  - PFTs on return

## 2024-01-26 NOTE — Assessment & Plan Note (Addendum)
 Body mass index is 55.41 kg/m.  -  trending up  Lab Results  Component Value Date   TSH 3.354 11/29/2019      Contributing to doe and risk of GERD/dvt/PE >>>   reviewed the need and the process to achieve and maintain neg calorie balance > defer f/u primary care including intermittently monitoring thyroid  status      Each maintenance medication was reviewed in detail including emphasizing most importantly the difference between maintenance and prns and under what circumstances the prns are to be triggered using an action plan format where appropriate.  Total time for H and P, chart review, counseling, reviewing hfa/dpi/ neb  device(s) and generating customized AVS unique to this office visit / same day charting = 48 min new pt eval for chronic  refractory respiratory  symptoms of uncertain etiology

## 2024-01-26 NOTE — Assessment & Plan Note (Addendum)
 Onset in her 16s with baseline wt 200 around age 63 p 2nd baby - 01/26/2024  After extensive coaching inhaler device,  effectiveness =    near 0 on both hfa/ dpi  - Allergy screen 01/26/2024 >  Eos 0. /  IgE    Her asthma must be very mild to have such poor technique but then she told me she also has neb just not sure what she put in it.  If she truly has chronic asthma needs ICS and best way to be sure she gets it is in combination with bronchodilator, first saba andthen laba if she continues to have symptoms attributable to asthma which I told her need to be distingished from chronic predictable DOE which is also proportionate to activity whereas asthma is not  Rec: ABC Action plan reviewed - see avs for instructions unique to this ov  Pfts on return with all meds in hand using a trust but verify approach to confirm accurate Medication  Reconciliation The principal here is that until we are certain that the  patients are doing what we've asked, it makes no sense to ask them to do more.

## 2024-01-28 ENCOUNTER — Ambulatory Visit: Payer: Self-pay | Admitting: Internal Medicine

## 2024-01-28 DIAGNOSIS — J4530 Mild persistent asthma, uncomplicated: Secondary | ICD-10-CM

## 2024-01-28 LAB — IGE: IgE (Immunoglobulin E), Serum: 621 kU/L — ABNORMAL HIGH (ref <=114)

## 2024-01-29 NOTE — Progress Notes (Signed)
 Spoke with patient regarding results and recommendations. Patient voiced understanding. No further questions or concerns.   Wants to be reffered to Allergy

## 2024-02-15 ENCOUNTER — Other Ambulatory Visit (HOSPITAL_BASED_OUTPATIENT_CLINIC_OR_DEPARTMENT_OTHER): Payer: Self-pay | Admitting: Cardiovascular Disease

## 2024-02-16 DIAGNOSIS — Z794 Long term (current) use of insulin: Secondary | ICD-10-CM | POA: Diagnosis not present

## 2024-02-16 DIAGNOSIS — I110 Hypertensive heart disease with heart failure: Secondary | ICD-10-CM | POA: Diagnosis not present

## 2024-02-16 DIAGNOSIS — G4733 Obstructive sleep apnea (adult) (pediatric): Secondary | ICD-10-CM | POA: Diagnosis not present

## 2024-02-16 DIAGNOSIS — E1169 Type 2 diabetes mellitus with other specified complication: Secondary | ICD-10-CM | POA: Diagnosis not present

## 2024-02-16 DIAGNOSIS — E785 Hyperlipidemia, unspecified: Secondary | ICD-10-CM | POA: Diagnosis not present

## 2024-02-18 DIAGNOSIS — M6281 Muscle weakness (generalized): Secondary | ICD-10-CM | POA: Diagnosis not present

## 2024-02-18 DIAGNOSIS — M25612 Stiffness of left shoulder, not elsewhere classified: Secondary | ICD-10-CM | POA: Diagnosis not present

## 2024-02-18 DIAGNOSIS — M19012 Primary osteoarthritis, left shoulder: Secondary | ICD-10-CM | POA: Diagnosis not present

## 2024-03-02 DIAGNOSIS — M25612 Stiffness of left shoulder, not elsewhere classified: Secondary | ICD-10-CM | POA: Diagnosis not present

## 2024-03-02 DIAGNOSIS — M19012 Primary osteoarthritis, left shoulder: Secondary | ICD-10-CM | POA: Diagnosis not present

## 2024-03-02 DIAGNOSIS — M6281 Muscle weakness (generalized): Secondary | ICD-10-CM | POA: Diagnosis not present

## 2024-03-09 DIAGNOSIS — M6281 Muscle weakness (generalized): Secondary | ICD-10-CM | POA: Diagnosis not present

## 2024-03-09 DIAGNOSIS — M19012 Primary osteoarthritis, left shoulder: Secondary | ICD-10-CM | POA: Diagnosis not present

## 2024-03-09 DIAGNOSIS — M25612 Stiffness of left shoulder, not elsewhere classified: Secondary | ICD-10-CM | POA: Diagnosis not present

## 2024-03-10 DIAGNOSIS — M19012 Primary osteoarthritis, left shoulder: Secondary | ICD-10-CM | POA: Diagnosis not present

## 2024-03-11 ENCOUNTER — Telehealth: Payer: Self-pay

## 2024-03-11 NOTE — Telephone Encounter (Signed)
   Pre-operative Risk Assessment    Patient Name: Katelyn Howard  DOB: 03/18/1961 MRN: [id number]   Date of last office visit: 11/23/2023, Reche Finder, NP Date of next office visit: NONE   Request for Surgical Clearance    Procedure:  Left reverse total shoulder arthroplasty  Date of Surgery:  Clearance TBD                                Surgeon: Dr Bonner Hair, MD Surgeon's Group or Practice Name: [facility] Phone number: [phone] Fax number: [phone]   Type of Clearance Requested:   - Medical  - Pharmacy:  Hold Aspirin not indicated or stated   Type of Anesthesia:  General    Additional requests/questions:    Bonney Asberry KANDICE Ethelene   03/11/2024, 4:59 PM

## 2024-03-15 ENCOUNTER — Telehealth: Payer: Self-pay

## 2024-03-15 NOTE — Telephone Encounter (Signed)
   Name: Katelyn Howard  DOB: 23-Jul-1961  MRN: [id number]  Primary Cardiologist: Annabella Scarce, MD   Preoperative team, please contact this patient and set up a phone call appointment for further preoperative risk assessment. Please obtain consent and complete medication review. Thank you for your help.  I confirm that guidance regarding antiplatelet and oral anticoagulation therapy has been completed and, if necessary, noted below.  Ideally aspirin should be continued without interruption, however if the bleeding risk is too great, aspirin may be held for 5-7 days prior to surgery. Please resume aspirin post operatively when it is felt to be safe from a bleeding standpoint.    I also confirmed the patient resides in the state of [place] . As per [place] [employer] telemedicine laws, the patient must reside in the state in which the provider is licensed.    Barnie Hila, NP 03/15/2024, 1:18 PM [facility]

## 2024-03-15 NOTE — Telephone Encounter (Signed)
 Preop clearance now scheduled, med rec and consent done

## 2024-03-15 NOTE — Telephone Encounter (Signed)
  Patient Consent for Virtual Visit        Katelyn Howard has provided verbal consent on 03/15/2024 for a virtual visit (video or telephone).   CONSENT FOR VIRTUAL VISIT FOR:  Katelyn Howard  By participating in this virtual visit I agree to the following:  I hereby voluntarily request, consent and authorize [facility] and its employed or contracted physicians, physician assistants, nurse practitioners or other licensed health care professionals (the Practitioner), to provide me with telemedicine health care services (the "Services) as deemed necessary by the treating Practitioner. I acknowledge and consent to receive the Services by the Practitioner via telemedicine. I understand that the telemedicine visit will involve communicating with the Practitioner through live audiovisual communication technology and the disclosure of certain medical information by electronic transmission. I acknowledge that I have been given the opportunity to request an in-person assessment or other available alternative prior to the telemedicine visit and am voluntarily participating in the telemedicine visit.  I understand that I have the right to withhold or withdraw my consent to the use of telemedicine in the course of my care at any time, without affecting my right to future care or treatment, and that the Practitioner or I may terminate the telemedicine visit at any time. I understand that I have the right to inspect all information obtained and/or recorded in the course of the telemedicine visit and may receive copies of available information for a reasonable fee.  I understand that some of the potential risks of receiving the Services via telemedicine include:  Delay or interruption in medical evaluation due to technological equipment failure or disruption; Information transmitted may not be sufficient (e.g. poor resolution of images) to allow for appropriate medical decision making by the  Practitioner; and/or  In rare instances, security protocols could fail, causing a breach of personal health information.  Furthermore, I acknowledge that it is my responsibility to provide information about my medical history, conditions and care that is complete and accurate to the best of my ability. I acknowledge that Practitioner's advice, recommendations, and/or decision may be based on factors not within their control, such as incomplete or inaccurate data provided by me or distortions of diagnostic images or specimens that may result from electronic transmissions. I understand that the practice of medicine is not an exact science and that Practitioner makes no warranties or guarantees regarding treatment outcomes. I acknowledge that a copy of this consent can be made available to me via my patient portal [facility] MyChart), or I can request a printed copy by calling the office of [facility].    I understand that my insurance will be billed for this visit.   I have read or had this consent read to me. I understand the contents of this consent, which adequately explains the benefits and risks of the Services being provided via telemedicine.  I have been provided ample opportunity to ask questions regarding this consent and the Services and have had my questions answered to my satisfaction. I give my informed consent for the services to be provided through the use of telemedicine in my medical care

## 2024-03-17 ENCOUNTER — Other Ambulatory Visit: Payer: Self-pay | Admitting: Orthopaedic Surgery

## 2024-03-17 DIAGNOSIS — Z01818 Encounter for other preprocedural examination: Secondary | ICD-10-CM

## 2024-03-17 DIAGNOSIS — G8929 Other chronic pain: Secondary | ICD-10-CM

## 2024-03-23 ENCOUNTER — Other Ambulatory Visit

## 2024-03-23 ENCOUNTER — Other Ambulatory Visit: Payer: Self-pay | Admitting: Internal Medicine

## 2024-03-23 DIAGNOSIS — Z1231 Encounter for screening mammogram for malignant neoplasm of breast: Secondary | ICD-10-CM

## 2024-03-29 ENCOUNTER — Ambulatory Visit

## 2024-03-29 ENCOUNTER — Ambulatory Visit: Attending: Cardiology

## 2024-03-29 DIAGNOSIS — Z01810 Encounter for preprocedural cardiovascular examination: Secondary | ICD-10-CM | POA: Diagnosis not present

## 2024-03-29 NOTE — Progress Notes (Signed)
 Virtual Visit via Telephone Note   Because of Katelyn Howard co-morbid illnesses, she is at least at moderate risk for complications without adequate follow up.  This format is felt to be most appropriate for this patient at this time.  Due to technical limitations with video connection (technology), today's appointment will be conducted as an audio only telehealth visit, and Katelyn Howard verbally agreed to proceed in this manner.   All issues noted in this document were discussed and addressed.  No physical exam could be performed with this format.  Evaluation Performed:  Preoperative cardiovascular risk assessment _____________   Date:  03/29/2024   Patient ID:  Katelyn Howard, DOB Jan 22, 1961, MRN [id number] Patient Location:  Home Provider location:   Office  Primary Care Provider:  Shayne Anes, MD Primary Cardiologist:  Annabella Scarce, MD  Chief Complaint / Patient Profile  63 y.o. y/o female with a h/o OSA on CPAP, asthma, obesity, diabetes, lymphedema who is pending left reverse total shoulder arthroplasty with Dr. Cristy and presents today for telephonic preoperative cardiovascular risk assessment. History of Present Illness   Katelyn Howard is a 63 y.o. female who presents via audio/video conferencing for a telehealth visit today.  Pt was last seen in cardiology clinic on 11/23/23 by Reche Finder, NP.  At that time Katelyn Howard was doing well.  The patient is now pending procedure as outlined above. Since her last visit, she has remained stable from a cardiac standpoint. Today she denies chest pain, shortness of breath, lower extremity edema, fatigue, palpitations, melena, hematuria, hemoptysis, diaphoresis, weakness, presyncope, syncope, orthopnea, and PND. She is able to achieve greater than 4 METs of activity, she is able to complete her ADLs, walking, household chores and is working to increase her strength with regular chair exercises.  Past  Medical History    Past Medical History:  Diagnosis Date   (HFpEF) heart failure with preserved ejection fraction (HCC) 09/05/2021   Allergy    Arthritis    Asthma    CHF (congestive heart failure) (HCC) 2008   Diabetes mellitus    Edema extremities    GERD (gastroesophageal reflux disease)    Heart failure, type unknown (HCC) 09/05/2021   Hyperlipidemia    Hypertension    Lymphedema 01/20/2023   Neuromuscular disorder (HCC)    neuropathy   Obesity    OSA (obstructive sleep apnea) 10/13/2016   Severe with AHI 44.9/hr now on CPAP at 11cm H2O   Sleep apnea    wears cpap    Past Surgical History:  Procedure Laterality Date   ABDOMINAL HYSTERECTOMY     EYE SURGERY      Allergies  Allergies  Allergen Reactions   Shrimp [Shellfish Allergy] Anaphylaxis   Tetracycline Nausea And Vomiting    Other reaction(s): Other (See Comments)  Other Reaction(s): Other (See Comments)   Metformin     Other reaction(s): Unknown   Penicillin G     Other reaction(s): Unknown   Penicillins Itching, Other (See Comments) and Swelling    Has patient had a PCN reaction causing immediate rash, facial/tongue/throat swelling, SOB or lightheadedness with hypotension: Unknown  Has patient had a PCN reaction causing severe rash involving mucus membranes or skin necrosis: Unknown  Has patient had a PCN reaction that required hospitalization: Unknown  Has patient had a PCN reaction occurring within the last 10 years: No  If all of the above answers are NO, then may proceed with Cephalosporin use.  Other  Reaction(s): Other (See Comments)  Has patient had a PCN reaction causing immediate rash, facial/tongue/throat swelling, SOB or lightheadedness with hypotension: Unknown, Has patient had a PCN reaction causing severe rash involving mucus membranes or skin necrosis: Unknown, Has patient had a PCN reaction that required hospitalization: Unknown, Has patient had a PCN reaction occurring within the  last 10 years: No, If all of the above answers are NO, then may proceed with Cephalosporin use.    Home Medications    Prior to Admission medications   Medication Sig Start Date End Date Taking? Authorizing Provider  albuterol  (PROVENTIL  HFA;VENTOLIN  HFA) 108 (90 BASE) MCG/ACT inhaler Inhale 2 puffs into the lungs every 6 (six) hours as needed. For wheezing    [provider]  amLODipine  (NORVASC ) 10 MG tablet Take 1 tablet by mouth once daily 05/12/23   Raford Riggs, MD  ARNUITY ELLIPTA 100 MCG/ACT AEPB Take 1 puff by mouth daily. 12/16/22   [provider]  aspirin EC 81 MG tablet Take 81 mg by mouth daily.    [provider]  atorvastatin (LIPITOR) 80 MG tablet Take 80 mg by mouth every morning.  04/25/15   [provider]  budesonide -glycopyrrolate-formoterol (BREZTRI  AEROSPHERE) 160-9-4.8 MCG/ACT AERO inhaler Inhale 2 puffs into the lungs in the morning and at bedtime. 01/26/24   Wert, Michael B, MD  Cholecalciferol 50000 units capsule Take 50,000 Units by mouth every Thursday.     [provider]  cloNIDine  (CATAPRES ) 0.1 MG tablet Take 1 tablet (0.1 mg total) by mouth 2 (two) times daily. 03/08/16   Zavitz, Joshua, MD  clotrimazole (LOTRIMIN) 1 % cream Apply 1 application topically daily as needed (for rash).    [provider]  cyclobenzaprine (FLEXERIL) 10 MG tablet TAKE 1 TABLET BY MOUTH TWICE DAILY AS NEEDED FOR MUSCLE PAIN OR SPASMS 02/09/19   [provider]  esomeprazole (NEXIUM) 40 MG capsule Take 40 mg by mouth 2 (two) times daily before a meal.  04/25/15   [provider]  fluticasone  (CUTIVATE ) 0.05 % cream Apply topically 2 (two) times daily. 04/23/15   Vincente Lynwood BIRCH, MD  fluticasone  (FLONASE ) 50 MCG/ACT nasal spray Place 1 spray into both nostrils 2 (two) times daily. 05/13/16   [provider]  folic acid (FOLVITE) 400 MCG tablet Take 400 mcg by mouth daily.    [provider]  furosemide   (LASIX ) 40 MG tablet Take 1 tablet by mouth twice daily 05/12/23   Raford Riggs, MD  glimepiride (AMARYL) 4 MG tablet Take 4 mg by mouth daily with breakfast.  04/25/15   [provider]  hydrOXYzine  (ATARAX /VISTARIL ) 25 MG tablet Take 1 tablet (25 mg total) by mouth every 6 (six) hours. Prn itching 04/23/15   Vincente Lynwood BIRCH, MD  insulin degludec (TRESIBA FLEXTOUCH) 200 UNIT/ML FlexTouch Pen INJECT 80 UNITS SUBCUTANEOUSLY IN THE MORNING AND 80 IN THE EVENING INCREASE AS DIRECTED PER CLINIC 05/12/19   [provider]  methocarbamol (ROBAXIN) 500 MG tablet Take 1 tablet by mouth daily. 05/21/16   [provider]  mupirocin ointment (BACTROBAN) 2 % Apply 1 application. topically 2 (two) times daily as needed. 09/09/21   [provider]  nebivolol (BYSTOLIC) 10 MG tablet Take 10 mg by mouth daily.    [provider]  nystatin cream (MYCOSTATIN) Apply 1 application. topically 2 (two) times daily as needed. 10/28/21   [provider]  oxyCODONE  (OXY IR/ROXICODONE ) 5 MG immediate release tablet Take 5 mg by mouth every  6 (six) hours as needed for moderate pain, severe pain or breakthrough pain. 12/22/22   [provider]  oxyCODONE -acetaminophen  (PERCOCET/ROXICET) 5-325 MG tablet Take 1 tablet by mouth every 4 (four) hours as needed for severe pain.    [provider]  potassium chloride (K-DUR) 10 MEQ tablet Take 10 mEq by mouth daily.     [provider]  potassium chloride (KLOR-CON M) 10 MEQ tablet Take 10 mEq by mouth daily. 08/17/23   [provider]  tirzepatide  (MOUNJARO ) 10 MG/0.5ML Pen Inject 10 mg into the skin once a week. 11/23/23   Walker, Caitlin S, NP  tirzepatide  (MOUNJARO ) 12.5 MG/0.5ML Pen Inject 12.5 mg into the skin once a week. 11/23/23   Vannie Reche RAMAN, NP  tirzepatide  (MOUNJARO ) 15 MG/0.5ML Pen Inject 15 mg into the skin once a week. 11/23/23   Walker, Caitlin S, NP  triamcinolone  cream (KENALOG ) 0.1 %  Apply 1 application. topically 2 (two) times daily as needed. 10/28/21   [provider]  valsartan  (DIOVAN ) 320 MG tablet Take 1 tablet by mouth once daily 02/15/24   Raford Riggs, MD    Physical Exam  Vital Signs:  JAMAIA BRUM does not have vital signs available for review today. Given telephonic nature of communication, physical exam is limited. AAOx3. NAD. Normal affect.  Speech and respirations are unlabored. Accessory Clinical Findings  None Assessment & Plan    1.  Preoperative Cardiovascular Risk Assessment: Ms. Shiffman perioperative risk of a major cardiac event is 6.6% according to the Revised Cardiac Risk Index (RCRI).  Her functional capacity is fair at 4.4 METs according to the [facility] Activity Status Index (DASI). Recommendations: According to ACC/AHA guidelines, no further cardiovascular testing needed.  The patient may proceed to surgery at acceptable risk.   Antiplatelet and/or Anticoagulation Recommendations: Ideally aspirin should be continued without interruption, however if the bleeding risk is too great, aspirin may be held for 5-7 days prior to surgery. Please resume aspirin post operatively when it is felt to be safe from a bleeding standpoint.    The patient was advised that if she develops new symptoms prior to surgery to contact our office to arrange for a follow-up visit, and she verbalized understanding.  A copy of this note will be routed to requesting surgeon.  Time:   Today, I have spent 12 minutes with the patient with telehealth technology discussing medical history, symptoms, and management plan.    Thersa Mohiuddin D Haadiya Frogge, NP  03/29/2024, 1:55 PM

## 2024-03-30 ENCOUNTER — Ambulatory Visit
Admission: EM | Admit: 2024-03-30 | Discharge: 2024-03-30 | Disposition: A | Discharge status: Home / Self Care | Attending: Family Medicine | Admitting: Family Medicine

## 2024-03-30 ENCOUNTER — Encounter: Payer: Self-pay | Admitting: Emergency Medicine

## 2024-03-30 DIAGNOSIS — R102 Pelvic and perineal pain: Secondary | ICD-10-CM | POA: Diagnosis not present

## 2024-03-30 DIAGNOSIS — R350 Frequency of micturition: Secondary | ICD-10-CM

## 2024-03-30 DIAGNOSIS — I87331 Chronic venous hypertension (idiopathic) with ulcer and inflammation of right lower extremity: Secondary | ICD-10-CM | POA: Insufficient documentation

## 2024-03-30 LAB — POCT URINE DIPSTICK
Bilirubin, UA: NEGATIVE
Glucose, UA: NEGATIVE mg/dL
Ketones, POC UA: NEGATIVE mg/dL
Leukocytes, UA: NEGATIVE
Nitrite, UA: NEGATIVE
Protein Ur, POC: NEGATIVE mg/dL
Spec Grav, UA: 1.015 (ref 1.010–1.025)
Urobilinogen, UA: 0.2 U/dL
pH, UA: 7 (ref 5.0–8.0)

## 2024-03-30 LAB — GLUCOSE, POCT (MANUAL RESULT ENTRY): POCT Glucose (KUC): 123 mg/dL — AB (ref 70–99)

## 2024-03-30 MED ORDER — ONDANSETRON 4 MG PO TBDP: 4.0000 mg | ORAL_TABLET | Freq: Three times a day (TID) | ORAL | 0 refills | Status: DC | PRN

## 2024-03-30 MED ORDER — ONDANSETRON 4 MG PO TBDP
4.0000 mg | ORAL_TABLET | Freq: Once | ORAL | Status: AC
  Administered 2024-03-30 (×2): 4 mg via ORAL

## 2024-03-30 NOTE — ED Provider Notes (Signed)
 [facility]   [id number] 03/30/24 Arrival Time: 1605  ASSESSMENT & PLAN:  1. Urinary frequency   2. Abdominal pain, suprapubic    Unclear etiology.  Declines ED evaluation for labs/imaging. Prefers to have labs drawn here this evening with the understanding that we will not see these until probably tomorrow. Tolerating PO intake. Non-surgical abdomen.  Meds ordered this encounter  Medications   ondansetron  (ZOFRAN -ODT) disintegrating tablet 4 mg   ondansetron  (ZOFRAN -ODT) 4 MG disintegrating tablet    Sig: Take 1 tablet (4 mg total) by mouth every 8 (eight) hours as needed for nausea or vomiting.    Dispense:  15 tablet    Refill:  0   Labs Reviewed  POCT URINE DIPSTICK - Abnormal; Notable for the following components:      Result Value   Blood, UA trace-intact (*)    All other components within normal limits  GLUCOSE, POCT (MANUAL RESULT ENTRY) - Abnormal; Notable for the following components:   POCT Glucose (KUC) 123 (*)    All other components within normal limits  URINE CULTURE  CBC WITH DIFFERENTIAL/PLATELET  COMPREHENSIVE METABOLIC PANEL WITH GFR  LIPASE, BLOOD      Discharge Instructions      You have been seen today for abdominal pain. Your evaluation was not suggestive of any emergent condition requiring medical intervention at this time. However, some abdominal problems make take more time to appear. Therefore, it is very important for you to pay attention to any new symptoms or worsening of your current condition.  Please return here or to the Emergency Department immediately should you begin to feel worse in any way or have any of the following symptoms: increasing or different abdominal pain, persistent vomiting, inability to drink fluids, fevers, or shaking chills.   You have had labs (blood work) sent today. We will call you with any significant abnormalities or if there is need to begin or change treatment or pursue further follow up.  You may  also review your test results online through MyChart. If you do not have a MyChart account, instructions to sign up should be on your discharge paperwork.     Follow-up Information     [facility].   Specialty: Emergency Medicine Why: If symptoms worsen in any way. Contact information: [address] [place]  [phone]                Reviewed expectations re: course of current medical issues. Questions answered. Outlined signs and symptoms indicating need for more acute intervention. Patient verbalized understanding. After Visit Summary given.   SUBJECTIVE: History from: patient. Katelyn Howard is a 63 y.o. female who presents with complaint of generalized but mostly supra-pubic abdominal discomfort; x 10 days; steady; feels fatigued. Has noted mild urinary frequency; denies dysuria; mild flank pain on R a few days ago but resolved. Denies fever. Tolerating PO intake but is nauseous. Last BM yesterday; non-bloody.  No tx PTA.  No LMP recorded. Patient has had a hysterectomy.  Past Surgical History:  Procedure Laterality Date   ABDOMINAL HYSTERECTOMY     EYE SURGERY       OBJECTIVE:  Vitals:   03/30/24 1637 03/30/24 1638  BP:  (!) 145/78  Pulse:  71  Resp:  20  Temp:  98.8 F (37.1 C)  TempSrc:  Oral  SpO2:  98%  Weight: (!) 141.9 kg     General appearance: alert, oriented,  no acute distress HEENT: [place]; AT; oropharynx moist Lungs: unlabored respirations Abdomen: obesity limits exam; soft; without distention; without masses or organomegaly; without guarding or rebound tenderness Back: without reported CVA tenderness; FROM at waist Skin: warm and dry Neurologic: normal gait Psychological: alert and cooperative; normal mood and affect  Labs: Results for orders placed or performed during the hospital encounter of 03/30/24  POCT URINE DIPSTICK   Collection Time: 03/30/24   4:57 PM  Result Value Ref Range   Color, UA yellow yellow   Clarity, UA clear clear   Glucose, UA negative negative mg/dL   Bilirubin, UA negative negative   Ketones, POC UA negative negative mg/dL   Spec Grav, UA 8.984 8.989 - 1.025   Blood, UA trace-intact (A) negative   pH, UA 7.0 5.0 - 8.0   Protein Ur, POC negative negative mg/dL   Urobilinogen, UA 0.2 0.2 or 1.0 E.U./dL   Nitrite, UA Negative Negative   Leukocytes, UA Negative Negative  POCT CBG (manual entry)   Collection Time: 03/30/24  5:19 PM  Result Value Ref Range   POCT Glucose (KUC) 123 (A) 70 - 99 mg/dL   Labs Reviewed  POCT URINE DIPSTICK - Abnormal; Notable for the following components:      Result Value   Blood, UA trace-intact (*)    All other components within normal limits  GLUCOSE, POCT (MANUAL RESULT ENTRY) - Abnormal; Notable for the following components:   POCT Glucose (KUC) 123 (*)    All other components within normal limits  URINE CULTURE  CBC WITH DIFFERENTIAL/PLATELET  COMPREHENSIVE METABOLIC PANEL WITH GFR  LIPASE, BLOOD    Imaging: No results found.   Allergies  Allergen Reactions   Doxycycline Hyclate Shortness Of Breath   Shrimp [Shellfish Allergy] Anaphylaxis   Tetracycline Nausea And Vomiting    Other reaction(s): Other (See Comments)  Other Reaction(s): Other (See Comments)   Canagliflozin     Other Reaction(s): blood sugar drops, heart rate low, yeast infections   Empagliflozin     Other Reaction(s): SOB, edema, recurrent yeast infection   Metformin Diarrhea    Other reaction(s): Unknown   Penicillin G     Other reaction(s): Unknown  Other Reaction(s): Unknown   Tramadol      Other Reaction(s): doesn't feel right when she takes it   Penicillins Itching, Other (See Comments) and Swelling    Has patient had a PCN reaction causing immediate rash, facial/tongue/throat swelling, SOB or lightheadedness with hypotension: Unknown  Has patient had a PCN reaction causing severe  rash involving mucus membranes or skin necrosis: Unknown  Has patient had a PCN reaction that required hospitalization: Unknown  Has patient had a PCN reaction occurring within the last 10 years: No  If all of the above answers are NO, then may proceed with Cephalosporin use.  Other Reaction(s): Other (See Comments)  Has patient had a PCN reaction causing immediate rash, facial/tongue/throat swelling, SOB or lightheadedness with hypotension: Unknown, Has patient had a PCN reaction causing severe rash involving mucus membranes or skin necrosis: Unknown, Has patient had a PCN reaction that required hospitalization: Unknown, Has patient had a PCN reaction occurring within the last 10 years: No, If all of the above answers are NO, then may proceed with Cephalosporin use.  Past Medical History:  Diagnosis Date   (HFpEF) heart failure with preserved ejection fraction (HCC) 09/05/2021   Allergy    Arthritis    Asthma    CHF (congestive heart failure) (HCC) 2008   Diabetes mellitus    Edema extremities    GERD (gastroesophageal reflux disease)    Heart failure, type unknown (HCC) 09/05/2021   Hyperlipidemia    Hypertension    Lymphedema 01/20/2023   Neuromuscular disorder (HCC)    neuropathy   Obesity    OSA (obstructive sleep apnea) 10/13/2016   Severe with AHI 44.9/hr now on CPAP at 11cm H2O   Sleep apnea    wears cpap     Social History   Socioeconomic History   Marital status: Married    Spouse name: Not on file   Number of children: Not on file   Years of education: Not on file   Highest education level: Not on file  Occupational History   Not on file  Tobacco Use   Smoking status: Never    Passive exposure: Never   Smokeless tobacco: Never  Vaping Use   Vaping status: Never Used  Substance and Sexual Activity   Alcohol  use: No   Drug use: No   Sexual activity: Yes    Birth control/protection: Surgical  Other  Topics Concern   Not on file  Social History Narrative   Not on file   Social Drivers of Health   Financial Resource Strain: Not on file  Food Insecurity: Not on file  Transportation Needs: Not on file  Physical Activity: Not on file  Stress: Not on file  Social Connections: Not on file  Intimate Partner Violence: Not on file    Family History  Problem Relation Age of Onset   Stroke Mother    Heart failure Father    Heart disease Father    Hypertension Father    Arthritis Father    Gout Father    Hypertension Sister    Diabetes Sister    Heart attack Sister    Diabetes Brother    Stroke Brother    Hypertension Brother    Colon cancer Neg Hx    Colon polyps Neg Hx    Rectal cancer Neg Hx    Stomach cancer Neg Hx      Rolinda Rogue, MD 03/30/24 [phone]

## 2024-03-30 NOTE — ED Triage Notes (Signed)
 Pt presents c/o abd pain and nausea x 10 days. Pt reports she feels weak with no energy and is starting to have frequent urination and back pain.

## 2024-03-30 NOTE — Discharge Instructions (Addendum)

## 2024-03-31 ENCOUNTER — Ambulatory Visit (HOSPITAL_COMMUNITY): Payer: Self-pay

## 2024-03-31 LAB — COMPREHENSIVE METABOLIC PANEL WITH GFR
ALT: 55 IU/L — ABNORMAL HIGH (ref 0–32)
AST: 37 IU/L (ref 0–40)
Albumin: 3.7 g/dL — ABNORMAL LOW (ref 3.9–4.9)
Alkaline Phosphatase: 139 IU/L — ABNORMAL HIGH (ref 44–121)
BUN/Creatinine Ratio: 15 (ref 12–28)
BUN: 12 mg/dL (ref 8–27)
Bilirubin Total: 0.3 mg/dL (ref 0.0–1.2)
CO2: 23 mmol/L (ref 20–29)
Calcium: 9 mg/dL (ref 8.7–10.3)
Chloride: 101 mmol/L (ref 96–106)
Creatinine, Ser: 0.82 mg/dL (ref 0.57–1.00)
Globulin, Total: 3.3 g/dL (ref 1.5–4.5)
Glucose: 137 mg/dL — ABNORMAL HIGH (ref 70–99)
Potassium: 4.2 mmol/L (ref 3.5–5.2)
Sodium: 137 mmol/L (ref 134–144)
Total Protein: 7 g/dL (ref 6.0–8.5)
eGFR: 80 mL/min/1.73 (ref >59)

## 2024-03-31 LAB — CBC WITH DIFFERENTIAL/PLATELET
Basophils Absolute: 0 x10E3/uL (ref 0.0–0.2)
Basos: 0 %
EOS (ABSOLUTE): 0.2 x10E3/uL (ref 0.0–0.4)
Eos: 3 %
Hematocrit: 37.7 % (ref 34.0–46.6)
Hemoglobin: 11.7 g/dL (ref 11.1–15.9)
Immature Grans (Abs): 0 x10E3/uL (ref 0.0–0.1)
Immature Granulocytes: 0 %
Lymphocytes Absolute: 2.2 x10E3/uL (ref 0.7–3.1)
Lymphs: 29 %
MCH: 25.3 pg — ABNORMAL LOW (ref 26.6–33.0)
MCHC: 31 g/dL — ABNORMAL LOW (ref 31.5–35.7)
MCV: 81 fL (ref 79–97)
Monocytes Absolute: 0.4 x10E3/uL (ref 0.1–0.9)
Monocytes: 5 %
Neutrophils Absolute: 4.9 x10E3/uL (ref 1.4–7.0)
Neutrophils: 63 %
Platelets: 263 x10E3/uL (ref 150–450)
RBC: 4.63 x10E6/uL (ref 3.77–5.28)
RDW: 14.9 % (ref 11.7–15.4)
WBC: 7.8 x10E3/uL (ref 3.4–10.8)

## 2024-04-01 ENCOUNTER — Ambulatory Visit: Admitting: Allergy

## 2024-04-01 LAB — URINE CULTURE: Culture: NO GROWTH

## 2024-04-06 ENCOUNTER — Inpatient Hospital Stay: Admission: RE | Admit: 2024-04-06 | Source: Ambulatory Visit

## 2024-04-08 ENCOUNTER — Ambulatory Visit
Admission: RE | Admit: 2024-04-08 | Discharge: 2024-04-08 | Disposition: A | Discharge status: Home / Self Care | Source: Ambulatory Visit | Attending: Orthopaedic Surgery | Admitting: Orthopaedic Surgery

## 2024-04-08 ENCOUNTER — Ambulatory Visit

## 2024-04-08 DIAGNOSIS — M19012 Primary osteoarthritis, left shoulder: Secondary | ICD-10-CM | POA: Diagnosis not present

## 2024-04-08 DIAGNOSIS — Z01818 Encounter for other preprocedural examination: Secondary | ICD-10-CM

## 2024-04-08 DIAGNOSIS — G8929 Other chronic pain: Secondary | ICD-10-CM

## 2024-04-15 ENCOUNTER — Ambulatory Visit: Admission: EM | Admit: 2024-04-15 | Discharge: 2024-04-15 | Disposition: A | Discharge status: Home / Self Care

## 2024-04-15 DIAGNOSIS — H60502 Unspecified acute noninfective otitis externa, left ear: Secondary | ICD-10-CM | POA: Diagnosis not present

## 2024-04-15 MED ORDER — ACETAMINOPHEN 325 MG PO TABS
650.0000 mg | ORAL_TABLET | Freq: Once | ORAL | Status: AC
  Administered 2024-04-15: 650 mg via ORAL

## 2024-04-15 MED ORDER — AZITHROMYCIN 500 MG PO TABS: 500.0000 mg | ORAL_TABLET | Freq: Every day | ORAL | 0 refills | Status: AC

## 2024-04-15 MED ORDER — NEOMYCIN-POLYMYXIN-HC 3.5-10000-1 OT SUSP: 4.0000 [drp] | Freq: Three times a day (TID) | OTIC | 0 refills | Status: AC

## 2024-04-15 NOTE — Discharge Instructions (Addendum)
 You were seen today for left ear pain. Your exam showed swelling and fluid in the ear canal with tenderness, which is consistent with an outer ear infection. You have been prescribed Cortisporin ear drops to use three times a day for the next 7 days, along with azithromycin . Be sure to take and use these medications exactly as directed and complete the full course even if you start to feel better. To use your ear drops properly, lie on your side with the affected ear facing up, place the drops in, and remain in that position for several minutes so the medicine can fully coat the ear canal.  At home, you may apply a warm compress to the side of your face to help reduce pain and discomfort. Over-the-counter acetaminophen  or ibuprofen  can also be used for pain control unless you have been told not to take these medications. When bathing or showering, keep water out of the ear by placing a small cotton ball with Vaseline on the outside of the ear canal. Do not insert Q-tips or any other objects into the ear.  Follow up with your primary care provider if your symptoms do not start to improve within 2-3 days, if pain continues after finishing your treatment, or if you have frequent ear infections. Seek immediate medical care in the emergency department if you develop a fever higher than 100.35F, sudden worsening of pain, spreading redness or swelling around the ear, new hearing loss, dizziness with vomiting, or if you feel very ill.

## 2024-04-15 NOTE — ED Provider Notes (Signed)
 [facility]    CSN: [id number] Arrival date & time: 04/15/24  1542      History   Chief Complaint Chief Complaint  Patient presents with   Otalgia    HPI Katelyn Howard is a 63 y.o. female.   Discussed the use of AI scribe software for clinical note transcription with the patient, who gave verbal consent to proceed.   Patient presents with acute onset of severe left ear pain that began last night after washing her hair. She describes the pain as constant and worsening, with no associated drainage from the ear, dizziness, tinnitus, headache, or fever. She reports significant discomfort but denies systemic symptoms. No prior history of recurrent ear infections or recent upper respiratory illness reported.  The following portions of the patient's history were reviewed and updated as appropriate: allergies, current medications, past family history, past medical history, past social history, past surgical history, and problem list.     Past Medical History:  Diagnosis Date   (HFpEF) heart failure with preserved ejection fraction (HCC) 09/05/2021   Allergy    Arthritis    Asthma    CHF (congestive heart failure) (HCC) 2008   Diabetes mellitus    Edema extremities    GERD (gastroesophageal reflux disease)    Heart failure, type unknown (HCC) 09/05/2021   Hyperlipidemia    Hypertension    Lymphedema 01/20/2023   Neuromuscular disorder (HCC)    neuropathy   Obesity    OSA (obstructive sleep apnea) 10/13/2016   Severe with AHI 44.9/hr now on CPAP at 11cm H2O   Sleep apnea    wears cpap     Patient Active Problem List   Diagnosis Date Noted   Chronic venous hypertension with ulcer and inflammation involving right side (HCC) 03/30/2024   Mild persistent asthma 01/26/2024   Chronic acquired lymphedema 01/20/2023   Intermittent asthma 03/22/2020   Super obesity 03/22/2020   Dependence on CPAP ventilation 03/22/2020   Obesity hypoventilation syndrome  (HCC) 03/22/2020   At risk for central sleep apnea 03/22/2020   Arthralgia of left knee 08/31/2019   Long term current use of insulin (HCC) 10/14/2018   Abnormal results of liver function studies 09/03/2018   Neck pain 04/02/2018   Ulcer of foot due to type 2 diabetes mellitus (HCC) 11/02/2017   Anemia 07/07/2017   Asymptomatic microscopic hematuria 07/07/2017   Obstructive sleep apnea syndrome 10/13/2016   Excessive daytime sleepiness 06/04/2016   Gasping for breath 06/04/2016   Snoring 05/13/2016   Encounter for screening for other disorder 11/07/2015   Hereditary disorder of nervous system 08/07/2015   Hypertensive heart disease with heart failure (HCC) 04/24/2015   Edema, unspecified 04/24/2015   Encounter for general adult medical examination without abnormal findings 04/24/2015   Gastro-esophageal reflux disease without esophagitis 04/24/2015   Hyperlipidemia 04/24/2015   Uncomplicated asthma 04/24/2015   Type 2 diabetes mellitus with other specified complication (HCC) 02/12/2007   HYPERCHOLESTEROLEMIA, PURE 02/12/2007   Morbid obesity (HCC) 02/12/2007   Essential hypertension 02/12/2007   ALLERGIC RHINITIS 02/12/2007   Asthma without status asthmaticus 02/12/2007   Rheumatoid arthritis (HCC) 02/12/2007    Past Surgical History:  Procedure Laterality Date   ABDOMINAL HYSTERECTOMY     EYE SURGERY      OB History   No obstetric history on file.      Home Medications    Prior to Admission medications   Medication Sig Start Date End Date Taking? Authorizing Provider  Acetaminophen  (TYLENOL  EXTRA  STRENGTH PO) Take by mouth.   Yes [provider]  azithromycin  (ZITHROMAX ) 500 MG tablet Take 1 tablet (500 mg total) by mouth daily for 5 days. 04/15/24 04/20/24 Yes Iola Lukes, FNP  neomycin -polymyxin-hydrocortisone  (CORTISPORIN) 3.5-10000-1 OTIC suspension Place 4 drops into the left ear 3 (three) times daily for 7 days. 04/15/24 04/22/24 Yes Iola Lukes,  FNP  albuterol  (PROVENTIL  HFA;VENTOLIN  HFA) 108 (90 BASE) MCG/ACT inhaler Inhale 2 puffs into the lungs every 6 (six) hours as needed. For wheezing    [provider]  amLODipine  (NORVASC ) 10 MG tablet Take 1 tablet by mouth once daily 05/12/23   Raford Riggs, MD  ARNUITY ELLIPTA 100 MCG/ACT AEPB Take 1 puff by mouth daily. 12/16/22   [provider]  aspirin EC 81 MG tablet Take 81 mg by mouth daily.    [provider]  atorvastatin (LIPITOR) 80 MG tablet Take 80 mg by mouth every morning.  04/25/15   [provider]  budesonide -glycopyrrolate-formoterol (BREZTRI  AEROSPHERE) 160-9-4.8 MCG/ACT AERO inhaler Inhale 2 puffs into the lungs in the morning and at bedtime. 01/26/24   Wert, Michael B, MD  Cholecalciferol 50000 units capsule Take 50,000 Units by mouth every Thursday.     [provider]  cloNIDine  (CATAPRES ) 0.1 MG tablet Take 1 tablet (0.1 mg total) by mouth 2 (two) times daily. 03/08/16   Zavitz, Joshua, MD  clotrimazole (LOTRIMIN) 1 % cream Apply 1 application topically daily as needed (for rash).    [provider]  cyclobenzaprine (FLEXERIL) 10 MG tablet TAKE 1 TABLET BY MOUTH TWICE DAILY AS NEEDED FOR MUSCLE PAIN OR SPASMS 02/09/19   [provider]  esomeprazole (NEXIUM) 40 MG capsule Take 40 mg by mouth 2 (two) times daily before a meal.  04/25/15   [provider]  fluticasone  (CUTIVATE ) 0.05 % cream Apply topically 2 (two) times daily. 04/23/15   Vincente Lynwood BIRCH, MD  fluticasone  (FLONASE ) 50 MCG/ACT nasal spray Place 1 spray into both nostrils 2 (two) times daily. 05/13/16   [provider]  fluticasone  (FLONASE ) 50 MCG/ACT nasal spray Place 2 sprays into both nostrils daily.    [provider]  folic acid (FOLVITE) 400 MCG tablet Take 400 mcg by mouth daily.    [provider]  furosemide  (LASIX ) 40 MG tablet Take 1 tablet by mouth twice daily 05/12/23   Raford Riggs, MD  glimepiride  (AMARYL) 4 MG tablet Take 4 mg by mouth daily with breakfast.  04/25/15   [provider]  hydrOXYzine  (ATARAX /VISTARIL ) 25 MG tablet Take 1 tablet (25 mg total) by mouth every 6 (six) hours. Prn itching 04/23/15   Vincente Lynwood BIRCH, MD  insulin degludec (TRESIBA FLEXTOUCH) 200 UNIT/ML FlexTouch Pen INJECT 80 UNITS SUBCUTANEOUSLY IN THE MORNING AND 80 IN THE EVENING INCREASE AS DIRECTED PER CLINIC 05/12/19   [provider]  methocarbamol (ROBAXIN) 500 MG tablet Take 1 tablet by mouth daily. 05/21/16   [provider]  mupirocin ointment (BACTROBAN) 2 % Apply 1 application. topically 2 (two) times daily as needed. 09/09/21   [provider]  nebivolol (BYSTOLIC) 10 MG tablet Take 10 mg by mouth daily.    [provider]  nystatin cream (MYCOSTATIN) Apply 1 application. topically 2 (two) times daily as needed. 10/28/21   [provider]  ondansetron  (ZOFRAN -ODT) 4 MG disintegrating tablet Take 1 tablet (4 mg total) by mouth every 8 (eight) hours as needed for nausea or vomiting. 03/30/24   Rolinda Rogue, MD  oxyCODONE  (OXY IR/ROXICODONE ) 5 MG immediate release tablet Take 5 mg by mouth every 6 (six) hours as needed for moderate pain, severe pain or breakthrough pain. 12/22/22   [provider]  oxyCODONE -acetaminophen  (PERCOCET/ROXICET) 5-325 MG tablet Take 1 tablet by mouth every 4 (four) hours as needed for severe pain.    [provider]  potassium chloride (K-DUR) 10 MEQ tablet Take 10 mEq by mouth daily.     [provider]  potassium chloride (KLOR-CON M) 10 MEQ tablet Take 10 mEq by mouth daily. 08/17/23   [provider]  tirzepatide  (MOUNJARO ) 10 MG/0.5ML Pen Inject 10 mg into the skin once a week. 11/23/23   Walker, Caitlin S, NP  tirzepatide  (MOUNJARO ) 12.5 MG/0.5ML Pen Inject 12.5 mg into the skin once a week. 11/23/23   Vannie Reche RAMAN, NP  tirzepatide  (MOUNJARO ) 15 MG/0.5ML Pen Inject 15 mg into the skin once a week.  11/23/23   Walker, Caitlin S, NP  triamcinolone  cream (KENALOG ) 0.1 % Apply 1 application. topically 2 (two) times daily as needed. 10/28/21   [provider]  valsartan  (DIOVAN ) 320 MG tablet Take 1 tablet by mouth once daily 02/15/24   Raford Riggs, MD    Family History Family History  Problem Relation Age of Onset   Stroke Mother    Heart failure Father    Heart disease Father    Hypertension Father    Arthritis Father    Gout Father    Hypertension Sister    Diabetes Sister    Heart attack Sister    Diabetes Brother    Stroke Brother    Hypertension Brother    Colon cancer Neg Hx    Colon polyps Neg Hx    Rectal cancer Neg Hx    Stomach cancer Neg Hx     Social History Social History   Tobacco Use   Smoking status: Never    Passive exposure: Never   Smokeless tobacco: Never  Vaping Use   Vaping status: Never Used  Substance Use Topics   Alcohol  use: No   Drug use: No     Allergies   Doxycycline hyclate, Shrimp [shellfish allergy], Tetracycline, Canagliflozin, Empagliflozin, Insulin regular human, Metformin, Na sulfate-k sulfate-mg sulf, Penicillin g, Tramadol , and Penicillins   Review of Systems Review of Systems  Constitutional:  Negative for fever.  HENT:  Positive for congestion and ear pain (left). Negative for ear discharge, sinus pressure, sinus pain, sore throat and trouble swallowing.   Respiratory:  Negative for cough.   Gastrointestinal:  Positive for nausea. Negative for diarrhea and vomiting.  Neurological:  Negative for dizziness and headaches.  All other systems reviewed and are negative.    Physical Exam Triage Vital Signs ED Triage Vitals  Encounter Vitals Group     BP 04/15/24 1745 (!) 155/81     Girls Systolic BP Percentile --      Girls Diastolic BP Percentile --      Boys Systolic BP Percentile --      Boys Diastolic BP Percentile --      Pulse Rate 04/15/24 1745 86     Resp 04/15/24 1745 20     Temp 04/15/24 1745  98.1 F (36.7 C)     Temp Source 04/15/24 1745 Oral     SpO2 04/15/24 1745 97 %     Weight 04/15/24 1743 (!) 312 lb 13.3 oz (141.9 kg)     Height 04/15/24 1743 5' 3 (1.6 m)  Head Circumference --      Peak Flow --      Pain Score 04/15/24 1740 9     Pain Loc --      Pain Education --      Exclude from Growth Chart --    No data found.  Updated Vital Signs BP (!) 155/81 (BP Location: Left Arm)   Pulse 86   Temp 98.1 F (36.7 C) (Oral)   Resp 20   Ht 5' 3 (1.6 m)   Wt (!) 312 lb 13.3 oz (141.9 kg)   SpO2 97%   BMI 55.42 kg/m   Visual Acuity Right Eye Distance:   Left Eye Distance:   Bilateral Distance:    Right Eye Near:   Left Eye Near:    Bilateral Near:     Physical Exam Vitals reviewed.  Constitutional:      General: She is awake. She is not in acute distress.    Appearance: Normal appearance. She is well-developed. She is obese. She is not ill-appearing, toxic-appearing or diaphoretic.     Comments: Patient is uncomfortable but nontoxic and does not appear acutely ill  HENT:     Head: Normocephalic.     Right Ear: Hearing normal.     Left Ear: Hearing and external ear normal. Drainage, swelling and tenderness present.  No middle ear effusion. No mastoid tenderness.     Ears:     Comments: Unable to visualize TM due to swelling    Nose: Nose normal.     Mouth/Throat:     Mouth: Mucous membranes are moist.  Eyes:     General: Vision grossly intact.     Conjunctiva/sclera: Conjunctivae normal.  Cardiovascular:     Rate and Rhythm: Normal rate and regular rhythm.     Heart sounds: Normal heart sounds.  Pulmonary:     Effort: Pulmonary effort is normal.     Breath sounds: Normal breath sounds and air entry.  Musculoskeletal:        General: Normal range of motion.     Cervical back: Full passive range of motion without pain, normal range of motion and neck supple.  Skin:    General: Skin is warm and dry.  Neurological:     General: No focal deficit  present.     Mental Status: She is alert and oriented to person, place, and time.  Psychiatric:        Speech: Speech normal.        Behavior: Behavior is cooperative.      UC Treatments / Results  Labs (all labs ordered are listed, but only abnormal results are displayed) Labs Reviewed - No data to display  EKG   Radiology No results found.  Procedures Procedures (including critical care time)  Medications Ordered in UC Medications  acetaminophen  (TYLENOL ) tablet 650 mg (650 mg Oral Given 04/15/24 1749)    Initial Impression / Assessment and Plan / UC Course  I have reviewed the triage vital signs and the nursing notes.  Pertinent labs & imaging results that were available during my care of the patient were reviewed by me and considered in my medical decision making (see chart for details).     Patient presents with acute onset of severe left ear pain beginning after washing her hair last night. She denies drainage, dizziness, tinnitus, headache, or fever. Exam shows swelling and fluid within the left ear canal with associated tragal tenderness, but no mastoid tenderness. The tympanic membrane could  not be visualized due to canal swelling. She is afebrile, uncomfortable, but nontoxic in appearance. Findings are most consistent with acute otitis externa with associated canal edema. She was prescribed Cortisporin eardrops to be used three times daily for 7 days along with oral azithromycin , and instructed on proper ear drop administration. Supportive measures include warm compresses to the affected side, acetaminophen  or ibuprofen  for pain, and use of a Vaseline-coated cotton ball during showering to keep water out of the ear. She was advised to avoid inserting objects into the ear canal. Patient instructed to follow up with her primary care provider if symptoms fail to improve within several days and to seek emergency care if she develops fever, worsening pain, spreading redness,  mastoid tenderness, hearing loss, or systemic illness.  Today's evaluation has revealed no signs of a dangerous process. Discussed diagnosis with patient and/or guardian. Patient and/or guardian aware of their diagnosis, possible red flag symptoms to watch out for and need for close follow up. Patient and/or guardian understands verbal and written discharge instructions. Patient and/or guardian comfortable with plan and disposition.  Patient and/or guardian has a clear mental status at this time, good insight into illness (after discussion and teaching) and has clear judgment to make decisions regarding their care  Documentation was completed with the aid of voice recognition software. Transcription may contain typographical errors. Final Clinical Impressions(s) / UC Diagnoses   Final diagnoses:  Acute otitis externa of left ear, unspecified type     Discharge Instructions      You were seen today for left ear pain. Your exam showed swelling and fluid in the ear canal with tenderness, which is consistent with an outer ear infection. You have been prescribed Cortisporin ear drops to use three times a day for the next 7 days, along with azithromycin . Be sure to take and use these medications exactly as directed and complete the full course even if you start to feel better. To use your ear drops properly, lie on your side with the affected ear facing up, place the drops in, and remain in that position for several minutes so the medicine can fully coat the ear canal.  At home, you may apply a warm compress to the side of your face to help reduce pain and discomfort. Over-the-counter acetaminophen  or ibuprofen  can also be used for pain control unless you have been told not to take these medications. When bathing or showering, keep water out of the ear by placing a small cotton ball with Vaseline on the outside of the ear canal. Do not insert Q-tips or any other objects into the ear.  Follow up with  your primary care provider if your symptoms do not start to improve within 2-3 days, if pain continues after finishing your treatment, or if you have frequent ear infections. Seek immediate medical care in the emergency department if you develop a fever higher than 100.64F, sudden worsening of pain, spreading redness or swelling around the ear, new hearing loss, dizziness with vomiting, or if you feel very ill.       ED Prescriptions     Medication Sig Dispense Auth. Provider   azithromycin  (ZITHROMAX ) 500 MG tablet Take 1 tablet (500 mg total) by mouth daily for 5 days. 5 tablet Iola Lukes, FNP   neomycin -polymyxin-hydrocortisone  (CORTISPORIN) 3.5-10000-1 OTIC suspension Place 4 drops into the left ear 3 (three) times daily for 7 days. 4.2 mL Iola Lukes, FNP      PDMP not reviewed this encounter.  Iola Lukes, [place] 04/15/24 [phone]

## 2024-04-15 NOTE — ED Triage Notes (Signed)
 My left ear is hurting me, starting yesterday, I was up all night and today continues to hurt. I do have continuous problems with my sinus's but this ear is killing me. No fever.

## 2024-04-20 DIAGNOSIS — S0181XS Laceration without foreign body of other part of head, sequela: Secondary | ICD-10-CM | POA: Diagnosis not present

## 2024-04-20 DIAGNOSIS — H6122 Impacted cerumen, left ear: Secondary | ICD-10-CM | POA: Diagnosis not present

## 2024-04-20 DIAGNOSIS — H60312 Diffuse otitis externa, left ear: Secondary | ICD-10-CM | POA: Diagnosis not present

## 2024-05-13 ENCOUNTER — Other Ambulatory Visit (HOSPITAL_BASED_OUTPATIENT_CLINIC_OR_DEPARTMENT_OTHER): Payer: Self-pay | Admitting: Cardiovascular Disease

## 2024-05-16 DIAGNOSIS — J449 Chronic obstructive pulmonary disease, unspecified: Secondary | ICD-10-CM | POA: Diagnosis not present

## 2024-06-01 DIAGNOSIS — E1142 Type 2 diabetes mellitus with diabetic polyneuropathy: Secondary | ICD-10-CM | POA: Diagnosis not present

## 2024-06-01 DIAGNOSIS — J4489 Other specified chronic obstructive pulmonary disease: Secondary | ICD-10-CM | POA: Diagnosis not present

## 2024-06-01 DIAGNOSIS — I130 Hypertensive heart and chronic kidney disease with heart failure and stage 1 through stage 4 chronic kidney disease, or unspecified chronic kidney disease: Secondary | ICD-10-CM | POA: Diagnosis not present

## 2024-06-01 DIAGNOSIS — E785 Hyperlipidemia, unspecified: Secondary | ICD-10-CM | POA: Diagnosis not present

## 2024-06-01 DIAGNOSIS — Z794 Long term (current) use of insulin: Secondary | ICD-10-CM | POA: Diagnosis not present

## 2024-06-01 DIAGNOSIS — I509 Heart failure, unspecified: Secondary | ICD-10-CM | POA: Diagnosis not present

## 2024-06-01 DIAGNOSIS — M0550 Rheumatoid polyneuropathy with rheumatoid arthritis of unspecified site: Secondary | ICD-10-CM | POA: Diagnosis not present

## 2024-06-01 DIAGNOSIS — M1990 Unspecified osteoarthritis, unspecified site: Secondary | ICD-10-CM | POA: Diagnosis not present

## 2024-08-04 ENCOUNTER — Ambulatory Visit (INDEPENDENT_AMBULATORY_CARE_PROVIDER_SITE_OTHER): Payer: Medicare HMO | Admitting: Adult Health

## 2024-08-04 ENCOUNTER — Encounter: Payer: Self-pay | Admitting: Adult Health

## 2024-08-04 VITALS — BP 130/49 | HR 63 | Ht 63.0 in | Wt 314.0 lb

## 2024-08-04 DIAGNOSIS — G4733 Obstructive sleep apnea (adult) (pediatric): Secondary | ICD-10-CM | POA: Diagnosis not present

## 2024-08-04 NOTE — Patient Instructions (Signed)
 Continue using CPAP nightly and greater than 4 hours each night  Order placed for new supplies If your symptoms worsen or you develop new symptoms please let us know.

## 2024-08-04 NOTE — Progress Notes (Signed)
 PATIENT: Katelyn Howard DOB: 21-Nov-1960  REASON FOR VISIT: follow up HISTORY FROM: patient PRIMARY NEUROLOGIST: Dr. Chalice  Chief Complaint  Patient presents with   Follow-up    Patient in room 18 alone  Patient here for follow up, patient states that she hasn't gotten any new supplies since the beginning of summer, and that she's concerned that her supplies are going to get old. Patient states she's confused on who she's supposed to get her supplies through.  ESS score 8     HISTORY OF PRESENT ILLNESS: Today 08/04/2024:  Katelyn Howard is a 63 y.o. female with a history of obstructive sleep apnea on CPAP. Returns today for follow-up.  Reports that CPAP is working well for her.  Last sleep study was in 2021 and showed severe sleep apnea.  She continues to find CPAP beneficial.  She denies any new issues.  Currently wearing the full face mask.  Download is below     Katelyn Howard is a 63 y.o. female with a history of obstructive sleep apnea on CPAP. Returns today for follow-up.  She reports that she has not had new supplies in the last 6 months.  She did try new mask last time but states that she cannot tolerate a cloth mask as it makes her hot.  She went back to her regular mask.  Her download is below     07/24/22: Katelyn Howard is a 63 y.o. female with a history of obstructive sleep apnea on CPAP.  Her download is below.  She reports that the CPAP is working well for her.  She does feel the mask leaking occasionally.  She does change out her supplies regularly.  She returns today for an evaluation.       07/24/21: [name] is a 63 year old female with a history of obstructive sleep apnea on CPAP.  She returns today for follow-up.  She reports that the CPAP is working well for her.  She denies any new issues.  She does report that she needs new straps.  She states that she misplaced hers and is currently using a string to hold her CPAP mask  on.    HISTORY: (Copied from Dr.Dohmeier's note) Katelyn Howard had was not eligible yet for a new machine but her machine could be reset to her needs.  She is a CPAP dependent patient who was retested in a sleep study on 03/1619 21 and diagnosed with severe obstructive sleep apnea with an AHI of 44/h, CPAP was titrated but central apneas emerged and REM sleep did rebound.  She did best at a CPAP of 14 cm water pressure and she used a full facemask in the night but hypoxemia was still seen during the REM sleep there..  A Simplus facemask had been given to her and medium size during the sleep study.  The patient now shows me her compliance data and since mid November she has been able to use the machine much more reliably.  She still has sometimes trouble to get more than 2-1/2 hours on her machine so the average user time is only 2 hours and 41 minutes.  She has been using the machine for the last 6 days over 4 hours at night CPAP is now set to 11 cmH2O was 2 cm EPR and her residual AHI was 1.7/h.  Air leaks are moderate at 14 L/min.  She feels that this is a comfortable setting.  She endorsed the Epworth sleepiness score  at 8 points out of 24 possible points and the fatigue severity score score is still high at 53 points. She still reports to snore some times, she wakes up without headaches.  Nocturia after 2-3 hours of sleep- and sometimes not putting the machine back on.   REVIEW OF SYSTEMS: Out of a complete 14 system review of symptoms, the patient complains only of the following symptoms, and all other reviewed systems are negative.   ESS 6  ALLERGIES: Allergies  Allergen Reactions   Doxycycline Hyclate Shortness Of Breath   Shrimp [Shellfish Allergy] Anaphylaxis   Tetracycline Nausea And Vomiting    Other reaction(s): Other (See Comments)  Other Reaction(s): Other (See Comments)   Canagliflozin     Other Reaction(s): blood sugar drops, heart rate low, yeast infections   Empagliflozin      Other Reaction(s): SOB, edema, recurrent yeast infection   Insulin Regular Human     Other Reaction(s): rash and felt sick and swelling   Metformin Diarrhea    Other reaction(s): Unknown   Na Sulfate-K Sulfate-Mg Sulf     Other Reaction(s): bad vomiting was unable to do the colonoscopy she says   Penicillin G     Other reaction(s): Unknown  Other Reaction(s): Unknown   Tramadol      Other Reaction(s): doesn't feel right when she takes it   Penicillins Itching, Other (See Comments) and Swelling    Has patient had a PCN reaction causing immediate rash, facial/tongue/throat swelling, SOB or lightheadedness with hypotension: Unknown  Has patient had a PCN reaction causing severe rash involving mucus membranes or skin necrosis: Unknown  Has patient had a PCN reaction that required hospitalization: Unknown  Has patient had a PCN reaction occurring within the last 10 years: No  If all of the above answers are NO, then may proceed with Cephalosporin use.  Other Reaction(s): Other (See Comments)  Has patient had a PCN reaction causing immediate rash, facial/tongue/throat swelling, SOB or lightheadedness with hypotension: Unknown, Has patient had a PCN reaction causing severe rash involving mucus membranes or skin necrosis: Unknown, Has patient had a PCN reaction that required hospitalization: Unknown, Has patient had a PCN reaction occurring within the last 10 years: No, If all of the above answers are NO, then may proceed with Cephalosporin use.    HOME MEDICATIONS: Outpatient Medications Prior to Visit  Medication Sig Dispense Refill   Acetaminophen  (TYLENOL  EXTRA STRENGTH PO) Take by mouth.     albuterol  (PROVENTIL  HFA;VENTOLIN  HFA) 108 (90 BASE) MCG/ACT inhaler Inhale 2 puffs into the lungs every 6 (six) hours as needed. For wheezing     amLODipine  (NORVASC ) 10 MG tablet Take 1 tablet by mouth once daily 90 tablet 1   ARNUITY ELLIPTA 100 MCG/ACT AEPB Take 1 puff by mouth daily.      aspirin EC 81 MG tablet Take 81 mg by mouth daily.     atorvastatin (LIPITOR) 80 MG tablet Take 80 mg by mouth every morning.      budesonide -glycopyrrolate-formoterol (BREZTRI  AEROSPHERE) 160-9-4.8 MCG/ACT AERO inhaler Inhale 2 puffs into the lungs in the morning and at bedtime.     cloNIDine  (CATAPRES ) 0.1 MG tablet Take 1 tablet (0.1 mg total) by mouth 2 (two) times daily. 60 tablet 11   clotrimazole (LOTRIMIN) 1 % cream Apply 1 application topically daily as needed (for rash).     cyclobenzaprine (FLEXERIL) 10 MG tablet TAKE 1 TABLET BY MOUTH TWICE DAILY AS NEEDED FOR MUSCLE PAIN OR SPASMS  esomeprazole (NEXIUM) 40 MG capsule Take 40 mg by mouth 2 (two) times daily before a meal.      fluticasone  (CUTIVATE ) 0.05 % cream Apply topically 2 (two) times daily. 30 g 1   fluticasone  (FLONASE ) 50 MCG/ACT nasal spray Place 1 spray into both nostrils 2 (two) times daily.     fluticasone  (FLONASE ) 50 MCG/ACT nasal spray Place 2 sprays into both nostrils daily.     furosemide  (LASIX ) 40 MG tablet Take 1 tablet by mouth twice daily 180 tablet 3   insulin degludec (TRESIBA FLEXTOUCH) 200 UNIT/ML FlexTouch Pen INJECT 80 UNITS SUBCUTANEOUSLY IN THE MORNING AND 80 IN THE EVENING INCREASE AS DIRECTED PER CLINIC     methocarbamol (ROBAXIN) 500 MG tablet Take 1 tablet by mouth daily.     mupirocin ointment (BACTROBAN) 2 % Apply 1 application. topically 2 (two) times daily as needed.     nebivolol (BYSTOLIC) 10 MG tablet Take 10 mg by mouth daily.     nystatin cream (MYCOSTATIN) Apply 1 application. topically 2 (two) times daily as needed.     ondansetron  (ZOFRAN -ODT) 4 MG disintegrating tablet Take 1 tablet (4 mg total) by mouth every 8 (eight) hours as needed for nausea or vomiting. 15 tablet 0   oxyCODONE  (OXY IR/ROXICODONE ) 5 MG immediate release tablet Take 5 mg by mouth every 6 (six) hours as needed for moderate pain, severe pain or breakthrough pain.     potassium chloride (K-DUR) 10 MEQ tablet Take 10  mEq by mouth daily.      potassium chloride (KLOR-CON M) 10 MEQ tablet Take 10 mEq by mouth daily.     tirzepatide  (MOUNJARO ) 12.5 MG/0.5ML Pen Inject 12.5 mg into the skin once a week. 2 mL 0   triamcinolone  cream (KENALOG ) 0.1 % Apply 1 application. topically 2 (two) times daily as needed.     valsartan  (DIOVAN ) 320 MG tablet Take 1 tablet by mouth once daily 90 tablet 2   Cholecalciferol 50000 units capsule Take 50,000 Units by mouth every Thursday.  (Patient not taking: Reported on 08/04/2024)     folic acid (FOLVITE) 400 MCG tablet Take 400 mcg by mouth daily. (Patient not taking: Reported on 08/04/2024)     glimepiride (AMARYL) 4 MG tablet Take 4 mg by mouth daily with breakfast.  (Patient not taking: Reported on 08/04/2024)     hydrOXYzine  (ATARAX /VISTARIL ) 25 MG tablet Take 1 tablet (25 mg total) by mouth every 6 (six) hours. Prn itching (Patient not taking: Reported on 08/04/2024) 20 tablet 1   oxyCODONE -acetaminophen  (PERCOCET/ROXICET) 5-325 MG tablet Take 1 tablet by mouth every 4 (four) hours as needed for severe pain. (Patient not taking: Reported on 08/04/2024)     tirzepatide  (MOUNJARO ) 10 MG/0.5ML Pen Inject 10 mg into the skin once a week. (Patient not taking: Reported on 08/04/2024) 2 mL 0   tirzepatide  (MOUNJARO ) 15 MG/0.5ML Pen Inject 15 mg into the skin once a week. (Patient not taking: Reported on 08/04/2024) 2 mL 3   No facility-administered medications prior to visit.    PAST MEDICAL HISTORY: Past Medical History:  Diagnosis Date   (HFpEF) heart failure with preserved ejection fraction (HCC) 09/05/2021   Allergy    Arthritis    Asthma    CHF (congestive heart failure) (HCC) 2008   Diabetes mellitus    Edema extremities    GERD (gastroesophageal reflux disease)    Heart failure, type unknown (HCC) 09/05/2021   Hyperlipidemia    Hypertension    Lymphedema 01/20/2023  Neuromuscular disorder (HCC)    neuropathy   Obesity    OSA (obstructive sleep apnea)  10/13/2016   Severe with AHI 44.9/hr now on CPAP at 11cm H2O   Sleep apnea    wears cpap     PAST SURGICAL HISTORY: Past Surgical History:  Procedure Laterality Date   ABDOMINAL HYSTERECTOMY     EYE SURGERY      FAMILY HISTORY: Family History  Problem Relation Age of Onset   Stroke Mother    Heart failure Father    Heart disease Father    Hypertension Father    Arthritis Father    Gout Father    Hypertension Sister    Diabetes Sister    Heart attack Sister    Diabetes Brother    Stroke Brother    Hypertension Brother    Colon cancer Neg Hx    Colon polyps Neg Hx    Rectal cancer Neg Hx    Stomach cancer Neg Hx     SOCIAL HISTORY: Social History   Socioeconomic History   Marital status: Married    Spouse name: Not on file   Number of children: Not on file   Years of education: Not on file   Highest education level: Not on file  Occupational History   Not on file  Tobacco Use   Smoking status: Never    Passive exposure: Never   Smokeless tobacco: Never  Vaping Use   Vaping status: Never Used  Substance and Sexual Activity   Alcohol  use: No   Drug use: No   Sexual activity: Yes    Birth control/protection: Surgical  Other Topics Concern   Not on file  Social History Narrative   Patient does not live alone   Patient is disabled.    Social Drivers of Health   Tobacco Use: Low Risk (08/04/2024)   Patient History    Smoking Tobacco Use: Never    Smokeless Tobacco Use: Never    Passive Exposure: Never  Financial Resource Strain: Not on file  Food Insecurity: Not on file  Transportation Needs: Not on file  Physical Activity: Not on file  Stress: Not on file  Social Connections: Not on file  Intimate Partner Violence: Not on file  Depression (EYV7-0): Not on file  Alcohol  Screen: Not on file  Housing: Not on file  Utilities: Not on file  Health Literacy: Not on file      PHYSICAL EXAM  Vitals:   08/04/24 0945  BP: (!) 130/49  Pulse: 63   Weight: (!) 314 lb (142.4 kg)  Height: 5' 3 (1.6 m)    Body mass index is 55.62 kg/m.  Generalized: Well developed, in no acute distress  Chest: Lungs clear to auscultation bilaterally  Neurological examination  Mentation: Alert oriented to time, place, history taking. Follows all commands speech and language fluent Cranial nerve II-XII: Extraocular movements were full, visual field were full on confrontational test Head turning and shoulder shrug  were normal and symmetric. Gait and station: Patient is in a wheelchair.  Uses a cane to ambulate.   DIAGNOSTIC DATA (LABS, IMAGING, TESTING) - I reviewed patient records, labs, notes, testing and imaging myself where available.  Lab Results  Component Value Date   WBC 7.8 03/30/2024   HGB 11.7 03/30/2024   HCT 37.7 03/30/2024   MCV 81 03/30/2024   PLT 263 03/30/2024      Component Value Date/Time   NA 137 03/30/2024 1723   K 4.2 03/30/2024  1723   CL 101 03/30/2024 1723   CO2 23 03/30/2024 1723   GLUCOSE 137 (H) 03/30/2024 1723   GLUCOSE 50 (L) 01/26/2024 1335   BUN 12 03/30/2024 1723   CREATININE 0.82 03/30/2024 1723   CALCIUM 9.0 03/30/2024 1723   PROT 7.0 03/30/2024 1723   ALBUMIN 3.7 (L) 03/30/2024 1723   AST 37 03/30/2024 1723   ALT 55 (H) 03/30/2024 1723   ALKPHOS 139 (H) 03/30/2024 1723   BILITOT 0.3 03/30/2024 1723   GFRNONAA 80 03/20/2020 1923   GFRAA 92 03/20/2020 1923   Lab Results  Component Value Date   CHOL 167 06/08/2023   HDL 58 06/08/2023   LDLCALC 92 06/08/2023   TRIG 93 06/08/2023   CHOLHDL 2.9 06/08/2023   Lab Results  Component Value Date   HGBA1C (H) 05/24/2009    9.8 (NOTE) The ADA recommends the following therapeutic goal for glycemic control related to Hgb A1c measurement: Goal of therapy: <6.5 Hgb A1c  Reference: American Diabetes Association: Clinical Practice Recommendations 2010, Diabetes Care, 2010, 33: (Suppl  1).   No results found for: VITAMINB12 Lab Results  Component  Value Date   TSH 3.354 11/29/2019      ASSESSMENT AND PLAN 63 y.o. year old female  has a past medical history of (HFpEF) heart failure with preserved ejection fraction (HCC) (09/05/2021), Allergy, Arthritis, Asthma, CHF (congestive heart failure) (HCC) (2008), Diabetes mellitus, Edema extremities, GERD (gastroesophageal reflux disease), Heart failure, type unknown (HCC) (09/05/2021), Hyperlipidemia, Hypertension, Lymphedema (01/20/2023), Neuromuscular disorder (HCC), Obesity, OSA (obstructive sleep apnea) (10/13/2016), and Sleep apnea. here with:  OSA on CPAP  - CPAP compliance excellent - Residual AHI good - Order sent to DME company for new supplies - Encourage patient to use CPAP nightly and > 4 hours each nigh - F/U in 1 year or sooner if needed  Orders Placed This Encounter  Procedures   For home use only DME continuous positive airway pressure (CPAP)    Needs new Supplies    Length of Need:   12 Months    Patient has OSA or probable OSA:   Yes    Is the patient currently using CPAP in the home:   Yes    Settings:   Other see comments    CPAP supplies needed:   Mask, headgear, cushions, filters, heated tubing and water chamber     Duwaine Russell, MSN, NP-C 08/04/2024, 9:54 AM [facility] [address], [address] [place], [place] [address] [phone]

## 2024-08-09 ENCOUNTER — Telehealth: Payer: Self-pay | Admitting: Adult Health

## 2024-08-09 NOTE — Telephone Encounter (Signed)
 Made pt aware orders were sent on 08/04/2024. Order was received Please allow  7-14 days for insurance approval . Pt states ok will give DME a call next week   RE: new orders Received: 5 days ago New, Adine Hurst, Rojean LABOR, CMA; Birdsong, Bradley; Cain, Mitchell; Ziegler, Melissa; Tucker, Dolanda Received, thank you!

## 2024-08-09 NOTE — Telephone Encounter (Signed)
 Pt called wanting to know when she will be getting a call for her supplies. Pt states that she was informed that if she had not heard from the DME to call the office. Please advise.

## 2024-09-23 NOTE — Progress Notes (Signed)
 Sent message, via epic in basket, requesting orders in epic from [facility] adviser.

## 2024-09-30 ENCOUNTER — Encounter (HOSPITAL_COMMUNITY): Admission: RE | Admit: 2024-09-30

## 2024-10-12 ENCOUNTER — Ambulatory Visit (HOSPITAL_COMMUNITY): Admit: 2024-10-12 | Admitting: Orthopaedic Surgery

## 2024-10-12 SURGERY — ARTHROPLASTY, SHOULDER, TOTAL, REVERSE
Anesthesia: General | Site: Shoulder | Laterality: Left

## 2025-08-08 ENCOUNTER — Ambulatory Visit: Admitting: Adult Health
